# Patient Record
Sex: Female | Born: 1980 | Race: White | Hispanic: No | Smoking: Never smoker
Health system: Southern US, Community
[De-identification: ages and names within clinical notes are randomized; demographics above are authoritative.]

## PROBLEM LIST (undated history)

## (undated) DIAGNOSIS — M419 Scoliosis, unspecified: Secondary | ICD-10-CM

## (undated) HISTORY — PX: CHOLECYSTECTOMY: SHX55

## (undated) HISTORY — DX: Scoliosis, unspecified: M41.9

## (undated) HISTORY — PX: PLACEMENT OF BREAST IMPLANTS: SHX6334

---
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
## 2006-05-31 LAB — HM PAP SMEAR

## 2008-09-18 ENCOUNTER — Observation Stay: Payer: Self-pay

## 2008-10-28 ENCOUNTER — Inpatient Hospital Stay: Payer: Self-pay

## 2008-10-29 DIAGNOSIS — R609 Edema, unspecified: Secondary | ICD-10-CM

## 2010-10-11 ENCOUNTER — Emergency Department: Payer: Self-pay | Admitting: Emergency Medicine

## 2011-11-12 ENCOUNTER — Ambulatory Visit: Payer: Self-pay | Admitting: Neurology

## 2015-06-13 ENCOUNTER — Encounter: Payer: Self-pay | Admitting: Family Medicine

## 2015-06-13 ENCOUNTER — Ambulatory Visit (INDEPENDENT_AMBULATORY_CARE_PROVIDER_SITE_OTHER): Payer: 59 | Admitting: Family Medicine

## 2015-06-13 VITALS — BP 101/61 | HR 73 | Temp 98.9°F | Ht 64.2 in | Wt 151.0 lb

## 2015-06-13 DIAGNOSIS — M25531 Pain in right wrist: Secondary | ICD-10-CM | POA: Diagnosis not present

## 2015-06-13 MED ORDER — TRAMADOL HCL 50 MG PO TABS: 50.0000 mg | ORAL_TABLET | Freq: Three times a day (TID) | ORAL | Status: DC | PRN

## 2015-06-13 MED ORDER — PREDNISONE 10 MG PO TABS: ORAL_TABLET | ORAL | Status: DC

## 2015-06-13 NOTE — Progress Notes (Signed)
BP 101/61 mmHg  Pulse 73  Temp(Src) 98.9 F (37.2 C)  Ht 5' 4.2" (1.631 m)  Wt 151 lb (68.493 kg)  BMI 25.75 kg/m2  SpO2 99%  LMP 05/30/2015 (Approximate)   Subjective:    Patient ID: Theresa Roberts, female    DOB: 20-Jun-1981, 34 y.o.   MRN: [id number]  HPI: Theresa Roberts is a 34 y.o. female  Chief Complaint  Patient presents with  . Wrist Pain    patient sits at a desk all day, she states that the pain in her wrist is getting worse. She has some weakness in it. Also she will have pain in her elbow and shoulder.   WRIST PAIN- sits at a desk all day and this is the mouse hand, sits uncomfortably at her desk and notes that she is not sitting comfortably  Duration: chronic but acting up a lot more over the past couple of days Involved wrist: right Mechanism of injury:  no trauma Location: volar medial Onset: sudden Severity: severe  Quality:  sharp and aching Frequency: constant Radiation: yes Aggravating factors: typing and gripping  Alleviating factors: ice, NSAIDs, brace and rest  Status: worse Treatments attempted: rest, ice, APAP, ibuprofen and aleve    Relief with NSAIDs?:  mild Weakness: yes Numbness: yes Median nerve distribution Redness: yes Bruising: no Swelling: yes Fevers: no  Relevant past medical, surgical, family and social history reviewed and updated as indicated. Interim medical history since our last visit reviewed. Allergies and medications reviewed and updated.  Review of Systems  Constitutional: Negative.   Respiratory: Negative.   Cardiovascular: Negative.   Musculoskeletal: Positive for myalgias, joint swelling and arthralgias. Negative for back pain, gait problem, neck pain and neck stiffness.  Psychiatric/Behavioral: Negative.     Per HPI unless specifically indicated above     Objective:    BP 101/61 mmHg  Pulse 73  Temp(Src) 98.9 F (37.2 C)  Ht 5' 4.2" (1.631 m)  Wt 151 lb (68.493 kg)  BMI 25.75 kg/m2  SpO2 99%  LMP  05/30/2015 (Approximate)  Wt Readings from Last 3 Encounters:  06/13/15 151 lb (68.493 kg)  06/12/15 152 lb (68.947 kg)    Physical Exam  Constitutional: She is oriented to person, place, and time. She appears well-developed and well-nourished. No distress.  HENT:  Head: Normocephalic and atraumatic.  Right Ear: Hearing normal.  Left Ear: Hearing normal.  Nose: Nose normal.  Eyes: Conjunctivae and lids are normal. Right eye exhibits no discharge. Left eye exhibits no discharge. No scleral icterus.  Pulmonary/Chest: Effort normal. No respiratory distress.  Musculoskeletal: She exhibits edema and tenderness.  R wrist- + Tinel's over carpal tunnel, negative over cubital, negative phalen's, decreased ROM to flexion, normal otherwise, strength 5/5 thoughout with some pain to extension  Neurological: She is alert and oriented to person, place, and time. She displays normal reflexes. She exhibits normal muscle tone. Coordination normal.  Sensation in tact to light touch throughout  Skin: Skin is warm, dry and intact. No rash noted. No erythema. No pallor.  Psychiatric: She has a normal mood and affect. Her speech is normal and behavior is normal. Judgment and thought content normal. Cognition and memory are normal.  Nursing note and vitals reviewed.   Results for orders placed or performed in visit on 06/12/15  HM PAP SMEAR  Result Value Ref Range   HM Pap smear PP       Assessment & Plan:   Problem List Items Addressed This Visit  Other   Wrist pain, right - Primary    Likely CTS. Prednisone and tramadol right now. Brace and exercises. To have ergonomic eval of her work station. Call if not getting better or getting worse.           Follow up plan: Return if symptoms worsen or fail to improve.

## 2015-06-13 NOTE — Assessment & Plan Note (Signed)
Likely CTS. Prednisone and tramadol right now. Brace and exercises. To have ergonomic eval of her work station. Call if not getting better or getting worse.

## 2015-06-19 ENCOUNTER — Encounter: Payer: Self-pay | Admitting: Family Medicine

## 2015-06-19 ENCOUNTER — Ambulatory Visit (INDEPENDENT_AMBULATORY_CARE_PROVIDER_SITE_OTHER): Payer: 59 | Admitting: Family Medicine

## 2015-06-19 VITALS — BP 104/71 | HR 73 | Temp 99.3°F | Ht 63.3 in | Wt 152.0 lb

## 2015-06-19 DIAGNOSIS — M549 Dorsalgia, unspecified: Secondary | ICD-10-CM | POA: Insufficient documentation

## 2015-06-19 DIAGNOSIS — M2550 Pain in unspecified joint: Secondary | ICD-10-CM

## 2015-06-19 DIAGNOSIS — M546 Pain in thoracic spine: Secondary | ICD-10-CM

## 2015-06-19 MED ORDER — IBUPROFEN 600 MG PO TABS: 600.0000 mg | ORAL_TABLET | Freq: Three times a day (TID) | ORAL | Status: DC | PRN

## 2015-06-19 MED ORDER — CYCLOBENZAPRINE HCL 10 MG PO TABS: 10.0000 mg | ORAL_TABLET | Freq: Every day | ORAL | Status: DC

## 2015-06-19 NOTE — Patient Instructions (Signed)

## 2015-06-19 NOTE — Progress Notes (Signed)
BP 104/71 mmHg  Pulse 73  Temp(Src) 99.3 F (37.4 C)  Ht 5' 3.3" (1.608 m)  Wt 152 lb (68.947 kg)  BMI 26.67 kg/m2  SpO2 98%  LMP 05/30/2015 (Approximate)   Subjective:    Patient ID: Theresa Roberts, female    DOB: 1980-11-16, 34 y.o.   MRN: [id number]  HPI: Theresa Roberts is a 34 y.o. female  Chief Complaint  Patient presents with  . Arm Pain   ARTHRALGIAS / JOINT ACHES- back is really hurting, feels like when she had gall stones. Thinks that it might be her chair at work. Very sore to touch anywhere on her back. Wonders if it's her scoliosis, Wrist is still hurting, still waiting for the ergonomic exam at work. Elbow was also really achey. Not sure if it's her stress level Duration: Off and on for a couple of months, but worse now Pain: yes Severity: Almost feels numb, feels hot off and on Symmetric: yes Quality: burning and numb, nauseated Frequency: constant for the past couple of days Context: worse Decreased function/range of motion: yes  Erythema: no Swelling: no Heat or warmth: no   Morning stiffness: yes Aggravating factors: cold, sitting Alleviating factors: stretching and heat, NSAIDs in the past but not now Relief with NSAIDs?: not anymore Treatments attempted: NSAIDs, heat, stretching Involved Joints:     Hands: right    Wrists: right    Elbows: right    Shoulders: bilateral     Back: yes       Relevant past medical, surgical, family and social history reviewed and updated as indicated. Interim medical history since our last visit reviewed. Allergies and medications reviewed and updated.  Review of Systems  Constitutional: Negative.   Respiratory: Negative.   Cardiovascular: Negative.   Musculoskeletal: Positive for joint swelling and arthralgias. Negative for myalgias, back pain, gait problem, neck pain and neck stiffness.  Psychiatric/Behavioral: Negative.     Per HPI unless specifically indicated above     Objective:    BP 104/71 mmHg   Pulse 73  Temp(Src) 99.3 F (37.4 C)  Ht 5' 3.3" (1.608 m)  Wt 152 lb (68.947 kg)  BMI 26.67 kg/m2  SpO2 98%  LMP 05/30/2015 (Approximate)  Wt Readings from Last 3 Encounters:  06/19/15 152 lb (68.947 kg)  06/13/15 151 lb (68.493 kg)  06/12/15 152 lb (68.947 kg)    Physical Exam  Constitutional: She is oriented to person, place, and time. She appears well-developed and well-nourished. No distress.  HENT:  Head: Normocephalic and atraumatic.  Right Ear: Hearing normal.  Left Ear: Hearing normal.  Nose: Nose normal.  Eyes: Conjunctivae and lids are normal. Right eye exhibits no discharge. Left eye exhibits no discharge. No scleral icterus.  Neck: Normal range of motion. Neck supple. No JVD present. No tracheal deviation present. No thyromegaly present.  Cardiovascular: Normal rate, regular rhythm, normal heart sounds and intact distal pulses.  Exam reveals no gallop and no friction rub.   No murmur heard. Pulmonary/Chest: Effort normal and breath sounds normal. No stridor. No respiratory distress. She has no wheezes. She has no rales. She exhibits no tenderness.  Abdominal: Soft. Bowel sounds are normal. She exhibits no distension and no mass. There is no tenderness. There is no rebound and no guarding.  Musculoskeletal: Normal range of motion. She exhibits tenderness. She exhibits no edema.  Lymphadenopathy:    She has no cervical adenopathy.  Neurological: She is alert and oriented to person, place, and time.  Skin: Skin is warm, dry and intact. No rash noted. No erythema. No pallor.  Psychiatric: She has a normal mood and affect. Her speech is normal and behavior is normal. Judgment and thought content normal. Cognition and memory are normal.  Nursing note and vitals reviewed. Back Exam:    Inspection:  Normal spinal curvature.  No deformity, ecchymosis, erythema, or lesions     Palpation:     Midline spinal tenderness: no      Paralumbar tenderness: no      Parathoracic  tenderness: yes L>R     Buttocks tenderness: no     Range of Motion:      Flexion: Normal     Extension:Decreased     Lateral bending:Decreased    Rotation:Decreased    Neuro Exam:Lower extremity DTRs normal & symmetric.  Strength and sensation intact.       Results for orders placed or performed in visit on 06/12/15  HM PAP SMEAR  Result Value Ref Range   HM Pap smear PP       Assessment & Plan:   Problem List Items Addressed This Visit      Other   Notalgia    Possibly due to muscle spasm and stress on top of her scoliosis. Will treat with exercises and muscle relaxer and ibuprofen. Await ergonomic evaluation through work. Call if not getting better or getting worse.       Relevant Medications   cyclobenzaprine (FLEXERIL) 10 MG tablet   ibuprofen (ADVIL,MOTRIN) 600 MG tablet    Other Visit Diagnoses    Arthralgia    -  Primary    Possibly still overuse. We will check for tick-bourne illnesses, labs drawn today. Await results.     Relevant Orders    Lyme Ab/Western Blot Reflex    Rocky mtn spotted fvr abs pnl(IgG+IgM)    Babesia microti Antibody Panel    Ehrlichia Antibody Panel    Antinuclear Antib (ANA)        Follow up plan: Return 3-4 weeks, for follow up back pain.

## 2015-06-19 NOTE — Assessment & Plan Note (Signed)
Possibly due to muscle spasm and stress on top of her scoliosis. Will treat with exercises and muscle relaxer and ibuprofen. Await ergonomic evaluation through work. Call if not getting better or getting worse.

## 2015-06-20 LAB — ANA: ANA: NEGATIVE

## 2015-06-21 LAB — ROCKY MTN SPOTTED FVR ABS PNL(IGG+IGM)
RMSF IGG: NEGATIVE
RMSF IGM: 1.19 {index} — AB (ref 0.00–0.89)

## 2015-06-21 LAB — BABESIA MICROTI ANTIBODY PANEL: Babesia microti IgG: <1:10 {titer}

## 2015-06-22 LAB — EHRLICHIA ANTIBODY PANEL
E. Chaffeensis (HME) IgM Titer: NEGATIVE
E.Chaffeensis (HME) IgG: NEGATIVE
HGE IGG TITER: NEGATIVE
HGE IGM TITER: NEGATIVE

## 2015-06-23 ENCOUNTER — Telehealth: Payer: Self-pay | Admitting: Family Medicine

## 2015-06-23 LAB — LYME, WESTERN BLOT, SERUM (REFLEXED)
IGG P18 AB.: ABSENT
IGG P28 AB.: ABSENT
IGG P30 AB.: ABSENT
IGG P39 AB.: ABSENT
IGG P93 AB.: ABSENT
IGM P39 AB.: ABSENT
IGM P41 AB.: ABSENT
IgG P41 Ab.: ABSENT
IgG P45 Ab.: ABSENT
IgG P58 Ab.: ABSENT
IgG P66 Ab.: ABSENT
Lyme IgG Wb: NEGATIVE
Lyme IgM Wb: NEGATIVE

## 2015-06-23 LAB — LYME AB/WESTERN BLOT REFLEX
LYME DISEASE AB, QUANT, IGM: 0.99 index — ABNORMAL HIGH (ref 0.00–0.79)
Lyme IgG/IgM Ab: <0.91 {ISR} (ref 0.00–0.90)

## 2015-06-23 MED ORDER — DOXYCYCLINE HYCLATE 100 MG PO TABS: 100.0000 mg | ORAL_TABLET | Freq: Two times a day (BID) | ORAL | Status: DC

## 2015-06-23 NOTE — Telephone Encounter (Signed)
Abnormal labs I called patient Explained abnormal labs, will start doxy and have Dr. Wynetta Emery call her when she returns She has had fever (low-level) 99.8, some neck stiffness, wrist pain; soreness to the touch over shoulder blades; popping neck feels better Start doxycycline today, as soon as possible; [facility] on [address]/[place] I'll ask Dr. Wynetta Emery to call her tomorrow Reasons to go to ER discussed

## 2015-06-24 NOTE — Telephone Encounter (Signed)
Called patient. She is feeling terrible. Discussed spirochete reaction to doxy. She will keep an eye on it and make sure she starts to feel better. Home today to sleep. She will call if not doing better in the next couple of days.

## 2015-06-26 ENCOUNTER — Ambulatory Visit (INDEPENDENT_AMBULATORY_CARE_PROVIDER_SITE_OTHER): Payer: 59 | Admitting: Family Medicine

## 2015-06-26 ENCOUNTER — Encounter: Payer: Self-pay | Admitting: Family Medicine

## 2015-06-26 VITALS — BP 107/73 | HR 86 | Temp 99.2°F | Wt 150.7 lb

## 2015-06-26 DIAGNOSIS — A770 Spotted fever due to Rickettsia rickettsii: Secondary | ICD-10-CM | POA: Diagnosis not present

## 2015-06-26 DIAGNOSIS — A6920 Lyme disease, unspecified: Secondary | ICD-10-CM

## 2015-06-26 MED ORDER — TRAMADOL HCL 50 MG PO TABS: 50.0000 mg | ORAL_TABLET | Freq: Three times a day (TID) | ORAL | Status: DC | PRN

## 2015-06-26 MED ORDER — DOXYCYCLINE HYCLATE 100 MG PO TABS: 100.0000 mg | ORAL_TABLET | Freq: Two times a day (BID) | ORAL | Status: DC

## 2015-06-26 NOTE — Progress Notes (Signed)
BP 107/73 mmHg  Pulse 86  Temp(Src) 99.2 F (37.3 C)  Wt 150 lb 11.2 oz (68.357 kg)  SpO2 100%  LMP 05/30/2015 (Approximate)   Subjective:    Patient ID: Theresa Roberts, female    DOB: Nov 04, 1980, 34 y.o.   MRN: [id number]  HPI: Theresa Roberts is a 34 y.o. female  Chief Complaint  Patient presents with  . Insect Bite   Had a bite on her back and was concerned about it being a tick bite since her diagnosis with Lyme and testing positive for RMSF. Still feeling really sore and achey. Still running about a 99 degree temp. She is tolerating the doxy a little bit better, but is still not feeling like herself. Doxy prescribed for 10 days. No other concerns or complaints at this time.   Relevant past medical, surgical, family and social history reviewed and updated as indicated. Interim medical history since our last visit reviewed. Allergies and medications reviewed and updated.  Review of Systems  Constitutional: Negative.   HENT: Negative.   Respiratory: Negative.   Cardiovascular: Negative.   Musculoskeletal: Positive for myalgias, back pain, arthralgias, neck pain and neck stiffness. Negative for joint swelling and gait problem.  Psychiatric/Behavioral: Negative.     Per HPI unless specifically indicated above     Objective:    BP 107/73 mmHg  Pulse 86  Temp(Src) 99.2 F (37.3 C)  Wt 150 lb 11.2 oz (68.357 kg)  SpO2 100%  LMP 05/30/2015 (Approximate)  Wt Readings from Last 3 Encounters:  06/26/15 150 lb 11.2 oz (68.357 kg)  06/19/15 152 lb (68.947 kg)  06/13/15 151 lb (68.493 kg)    Physical Exam  Constitutional: She is oriented to person, place, and time. She appears well-developed and well-nourished. No distress.  HENT:  Head: Normocephalic and atraumatic.  Right Ear: Hearing normal.  Left Ear: Hearing normal.  Nose: Nose normal.  Eyes: Conjunctivae and lids are normal. Right eye exhibits no discharge. Left eye exhibits no discharge. No scleral icterus.   Cardiovascular: Normal rate, regular rhythm, normal heart sounds and intact distal pulses.  Exam reveals no gallop and no friction rub.   No murmur heard. Pulmonary/Chest: Effort normal and breath sounds normal. No respiratory distress. She has no wheezes. She has no rales. She exhibits no tenderness.  Musculoskeletal: Normal range of motion.  Neurological: She is alert and oriented to person, place, and time.  Skin: Skin is warm, dry and intact. No rash noted. No erythema. No pallor.  Dry skin with small area of excoriation on R lumbar, no sign of scab or tick or rash  Psychiatric: She has a normal mood and affect. Her speech is normal and behavior is normal. Judgment and thought content normal. Cognition and memory are normal.  Nursing note and vitals reviewed.   Results for orders placed or performed in visit on 06/19/15  Lyme Ab/Western Blot Reflex  Result Value Ref Range   Lyme IgG/IgM Ab <0.91 0.00 - 0.90 ISR   LYME DISEASE AB, QUANT, IGM 0.99 (H) 0.00 - 0.79 index  Rocky mtn spotted fvr abs pnl(IgG+IgM)  Result Value Ref Range   RMSF IgG Negative Negative   RMSF IgM 1.19 (H) 0.00 - 0.89 index  Babesia microti Antibody Panel  Result Value Ref Range   Babesia microti IgM <1:10 Neg:<1:10   Babesia microti IgG [id number]  Ehrlichia Antibody Panel  Result Value Ref Range   E.Chaffeensis (HME) IgG Negative Neg:<1:64   E. Chaffeensis (HME)  IgM Titer Negative Neg:<1:20   HGE IgG Titer Negative Neg:<1:64   HGE IgM Titer Negative Neg:<1:20  Antinuclear Antib (ANA)  Result Value Ref Range   Anit Nuclear Antibody(ANA) Negative Negative  Lyme, Western Blot, Serum  Result Value Ref Range     IgG P93 Ab. Absent      IgG P66 Ab. Absent      IgG P58 Ab. Absent      IgG P45 Ab. Absent      IgG P41 Ab. Absent      IgG P39 Ab. Absent      IgG P30 Ab. Absent      IgG P28 Ab. Absent      IgG P23 Ab. Present (A)      IgG P18 Ab. Absent    Lyme IgG Wb Negative      IgM P41 Ab.  Absent      IgM P39 Ab. Absent      IgM P23 Ab. Present (A)    Lyme IgM Wb Negative       Assessment & Plan:   Problem List Items Addressed This Visit    None    Visit Diagnoses    Lyme disease    -  Primary    Only 4 days into her doxy. Will continue it for 3 weeks, Refill of doxy to get through the whole 3 weeks given today.     Relevant Medications    doxycycline (VIBRA-TABS) 100 MG tablet    RMSF [facility] spotted fever)        Only 4 days into her doxy. Will continue it for 3 weeks, Refill of doxy to get through the whole 3 weeks given today.         Follow up plan: Return Monday or Tuesday after Thanksgiving, for Check on myalgias.

## 2015-07-07 ENCOUNTER — Telehealth: Payer: Self-pay

## 2015-07-07 NOTE — Telephone Encounter (Signed)
Patient called, she has an appt with [place] tomorrow. She would like to know if any of the [employer] paper work has been completed yet.

## 2015-07-07 NOTE — Telephone Encounter (Signed)
Notified patient.

## 2015-07-07 NOTE — Telephone Encounter (Signed)
All set and filled out to be faxed over and scanned. She can pick up a copy if she would like.

## 2015-07-08 ENCOUNTER — Ambulatory Visit (INDEPENDENT_AMBULATORY_CARE_PROVIDER_SITE_OTHER): Payer: 59 | Admitting: Family Medicine

## 2015-07-08 ENCOUNTER — Encounter: Payer: Self-pay | Admitting: Family Medicine

## 2015-07-08 VITALS — BP 104/69 | HR 77 | Temp 99.0°F | Ht 64.4 in | Wt 153.0 lb

## 2015-07-08 DIAGNOSIS — A6920 Lyme disease, unspecified: Secondary | ICD-10-CM

## 2015-07-08 MED ORDER — NAPROXEN 500 MG PO TABS: 500.0000 mg | ORAL_TABLET | Freq: Two times a day (BID) | ORAL | Status: DC

## 2015-07-08 MED ORDER — AMOXICILLIN 875 MG PO TABS: 875.0000 mg | ORAL_TABLET | Freq: Two times a day (BID) | ORAL | Status: DC

## 2015-07-08 NOTE — Patient Instructions (Signed)
Lactobacilis  Acidophilus

## 2015-07-08 NOTE — Progress Notes (Signed)
BP 104/69 mmHg  Pulse 77  Temp(Src) 99 F (37.2 C)  Ht 5' 4.4" (1.636 m)  Wt 153 lb (69.4 kg)  BMI 25.93 kg/m2  SpO2 98%  LMP 06/17/2015   Subjective:    Patient ID: Theresa Roberts, female    DOB: 1980-11-04, 34 y.o.   MRN: [id number]  HPI: Theresa Roberts is a 34 y.o. female  Chief Complaint  Patient presents with  . Tailbone Pain   Tail bone has been hurting and has been really acting up and causing her to be uncomfortable. All of her joints are still really hurting. Her back is still really hurting to touch. Feeling really tight and achy. Still running a 99 degree temp, which is not normal for her. Not feeling any different on her ibuprofen. Can't take the other pills during the day because they make her really sleepy. Tailbone has been hurting the most, but still really achy and just not like herself  Relevant past medical, surgical, family and social history reviewed and updated as indicated. Interim medical history since our last visit reviewed. Allergies and medications reviewed and updated.  Review of Systems  Constitutional: Negative.   Respiratory: Negative.   Cardiovascular: Negative.   Musculoskeletal: Negative.   Psychiatric/Behavioral: Negative.     Per HPI unless specifically indicated above     Objective:    BP 104/69 mmHg  Pulse 77  Temp(Src) 99 F (37.2 C)  Ht 5' 4.4" (1.636 m)  Wt 153 lb (69.4 kg)  BMI 25.93 kg/m2  SpO2 98%  LMP 06/17/2015  Wt Readings from Last 3 Encounters:  07/08/15 153 lb (69.4 kg)  06/26/15 150 lb 11.2 oz (68.357 kg)  06/19/15 152 lb (68.947 kg)    Physical Exam  Constitutional: She is oriented to person, place, and time. She appears well-developed and well-nourished. No distress.  HENT:  Head: Normocephalic and atraumatic.  Right Ear: Hearing normal.  Left Ear: Hearing normal.  Nose: Nose normal.  Eyes: Conjunctivae and lids are normal. Right eye exhibits no discharge. Left eye exhibits no discharge. No scleral  icterus.  Cardiovascular: Normal rate, regular rhythm, normal heart sounds and intact distal pulses.  Exam reveals no gallop and no friction rub.   No murmur heard. Pulmonary/Chest: Effort normal and breath sounds normal. No respiratory distress. She has no wheezes. She has no rales. She exhibits no tenderness.  Musculoskeletal: Normal range of motion. She exhibits tenderness. She exhibits no edema.  Neurological: She is alert and oriented to person, place, and time.  Skin: Skin is warm, dry and intact. No rash noted. No erythema. No pallor.  Psychiatric: She has a normal mood and affect. Her speech is normal and behavior is normal. Judgment and thought content normal. Cognition and memory are normal.  Nursing note and vitals reviewed.   Results for orders placed or performed in visit on 06/19/15  Lyme Ab/Western Blot Reflex  Result Value Ref Range   Lyme IgG/IgM Ab <0.91 0.00 - 0.90 ISR   LYME DISEASE AB, QUANT, IGM 0.99 (H) 0.00 - 0.79 index  Rocky mtn spotted fvr abs pnl(IgG+IgM)  Result Value Ref Range   RMSF IgG Negative Negative   RMSF IgM 1.19 (H) 0.00 - 0.89 index  Babesia microti Antibody Panel  Result Value Ref Range   Babesia microti IgM <1:10 Neg:<1:10   Babesia microti IgG [id number]  Ehrlichia Antibody Panel  Result Value Ref Range   E.Chaffeensis (HME) IgG Negative Neg:<1:64   E. Chaffeensis (HME)  IgM Titer Negative Neg:<1:20   HGE IgG Titer Negative Neg:<1:64   HGE IgM Titer Negative Neg:<1:20  Antinuclear Antib (ANA)  Result Value Ref Range   Anit Nuclear Antibody(ANA) Negative Negative  Lyme, Western Blot, Serum  Result Value Ref Range     IgG P93 Ab. Absent      IgG P66 Ab. Absent      IgG P58 Ab. Absent      IgG P45 Ab. Absent      IgG P41 Ab. Absent      IgG P39 Ab. Absent      IgG P30 Ab. Absent      IgG P28 Ab. Absent      IgG P23 Ab. Present (A)      IgG P18 Ab. Absent    Lyme IgG Wb Negative      IgM P41 Ab. Absent      IgM P39 Ab. Absent       IgM P23 Ab. Present (A)    Lyme IgM Wb Negative       Assessment & Plan:   Problem List Items Addressed This Visit    None    Visit Diagnoses    Lyme disease    -  Primary    Will add amoxicillin and continue doxy. Take probiotic. Will monitor closely. Naproxen for pain. Rest and plenty of fluids. Follow up 2 weeks.     Relevant Medications    amoxicillin (AMOXIL) 875 MG tablet        Follow up plan: Return As scheduled.

## 2015-07-17 ENCOUNTER — Ambulatory Visit: Payer: 59 | Admitting: Family Medicine

## 2015-07-18 ENCOUNTER — Ambulatory Visit (INDEPENDENT_AMBULATORY_CARE_PROVIDER_SITE_OTHER): Payer: 59 | Admitting: Family Medicine

## 2015-07-18 ENCOUNTER — Encounter: Payer: Self-pay | Admitting: Family Medicine

## 2015-07-18 VITALS — BP 104/66 | HR 74 | Temp 98.9°F | Ht 64.0 in | Wt 157.2 lb

## 2015-07-18 DIAGNOSIS — M2550 Pain in unspecified joint: Secondary | ICD-10-CM

## 2015-07-18 DIAGNOSIS — M9905 Segmental and somatic dysfunction of pelvic region: Secondary | ICD-10-CM | POA: Diagnosis not present

## 2015-07-18 DIAGNOSIS — M9903 Segmental and somatic dysfunction of lumbar region: Secondary | ICD-10-CM | POA: Diagnosis not present

## 2015-07-18 DIAGNOSIS — M533 Sacrococcygeal disorders, not elsewhere classified: Secondary | ICD-10-CM

## 2015-07-18 DIAGNOSIS — M9904 Segmental and somatic dysfunction of sacral region: Secondary | ICD-10-CM

## 2015-07-18 DIAGNOSIS — M9909 Segmental and somatic dysfunction of abdomen and other regions: Secondary | ICD-10-CM | POA: Diagnosis not present

## 2015-07-18 DIAGNOSIS — M9906 Segmental and somatic dysfunction of lower extremity: Secondary | ICD-10-CM

## 2015-07-18 NOTE — Progress Notes (Signed)
BP 104/66 mmHg  Pulse 74  Temp(Src) 98.9 F (37.2 C)  Ht 5' 4"  (1.626 m)  Wt 157 lb 3.2 oz (71.305 kg)  BMI 26.97 kg/m2  SpO2 100%  LMP 06/17/2015   Subjective:    Patient ID: Theresa Roberts, female    DOB: [birth year], 34 y.o.   MRN: [id number]  HPI: Theresa Roberts is a 34 y.o. female  Chief Complaint  Patient presents with  . Back Pain    OMM   Still tail bone really hurting and still having neck pain. Not sure if the amoxicillin really helped. Still feeling like she is having fevers, low grade, subjective, Notes that it's the first time that she has been below 99, feeling clammy and sweaty. No diarrhea. Ergonomics hasn't gotten in contact with her. Better now than she had been, still feeling really achey and not feeling like herself Has apthous ulcer at the back of her mouth. Her tail bone continues to hurt, especially when sitting. Work has been more difficult about letting her get up a move around. She notes that her pain is severe and aching and deep. Some radiation into her back. No numbness or tingling.She would be interested in doing OMT to see if it would help.  No other concerns or complaints at this time.   Relevant past medical, surgical, family and social history reviewed and updated as indicated. Interim medical history since our last visit reviewed. Allergies and medications reviewed and updated.  Review of Systems  Constitutional: Negative.   Respiratory: Negative.   Cardiovascular: Negative.   Musculoskeletal: Positive for myalgias, back pain, joint swelling, arthralgias and gait problem. Negative for neck pain and neck stiffness.  Skin: Negative.   Neurological: Negative.   Psychiatric/Behavioral: Negative.     Per HPI unless specifically indicated above     Objective:    BP 104/66 mmHg  Pulse 74  Temp(Src) 98.9 F (37.2 C)  Ht 5' 4"  (1.626 m)  Wt 157 lb 3.2 oz (71.305 kg)  BMI 26.97 kg/m2  SpO2 100%  LMP 06/17/2015  Wt Readings from Last 3  Encounters:  07/18/15 157 lb 3.2 oz (71.305 kg)  07/08/15 153 lb (69.4 kg)  06/26/15 150 lb 11.2 oz (68.357 kg)    Physical Exam  Constitutional: She is oriented to person, place, and time. She appears well-developed and well-nourished. No distress.  HENT:  Head: Normocephalic and atraumatic.  Right Ear: Hearing normal.  Left Ear: Hearing normal.  Nose: Nose normal.  Eyes: Conjunctivae and lids are normal. Right eye exhibits no discharge. Left eye exhibits no discharge. No scleral icterus.  Pulmonary/Chest: Effort normal. No respiratory distress.  Abdominal: Soft. She exhibits no distension and no mass. There is no tenderness. There is no rebound and no guarding.  Neurological: She is alert and oriented to person, place, and time.  Skin: Skin is warm, dry and intact. No rash noted. No erythema. No pallor.  Psychiatric: She has a normal mood and affect. Her speech is normal and behavior is normal. Judgment and thought content normal. Cognition and memory are normal.  Nursing note and vitals reviewed. Musculoskeletal:  Exam found Decreased ROM, Tissue texture changes, Tenderness to palpation and Asymmetry of patient's  lumbar, pelvis, sacrum, lower extremity and abdomen Osteopathic Structural Exam:   Lumbar: QL hypertonic on the L, psoas hypertonic bilaterally, L3-5SLRR  Pelvis: Anterior R innominate, pubic sheer on the L  Sacrum: R on L sacral torsion, SI joint restricted on the R, interosseous strain  through S1  Lower Extremity:glut spasm on the L, hamstring spasm on the L  Abdomen: diaphragm spasm on the L, pelvic diaphragm into the sacrum   Results for orders placed or performed in visit on 06/19/15  Lyme Ab/Western Blot Reflex  Result Value Ref Range   Lyme IgG/IgM Ab <0.91 0.00 - 0.90 ISR   LYME DISEASE AB, QUANT, IGM 0.99 (H) 0.00 - 0.79 index  Rocky mtn spotted fvr abs pnl(IgG+IgM)  Result Value Ref Range   RMSF IgG Negative Negative   RMSF IgM 1.19 (H) 0.00 - 0.89 index   Babesia microti Antibody Panel  Result Value Ref Range   Babesia microti IgM <1:10 Neg:<1:10   Babesia microti IgG <7:35 HGD:<9:24  Ehrlichia Antibody Panel  Result Value Ref Range   E.Chaffeensis (HME) IgG Negative Neg:<1:64   E. Chaffeensis (HME) IgM Titer Negative Neg:<1:20   HGE IgG Titer Negative Neg:<1:64   HGE IgM Titer Negative Neg:<1:20  Antinuclear Antib (ANA)  Result Value Ref Range   Anit Nuclear Antibody(ANA) Negative Negative  Lyme, Western Blot, Serum  Result Value Ref Range     IgG P93 Ab. Absent      IgG P66 Ab. Absent      IgG P58 Ab. Absent      IgG P45 Ab. Absent      IgG P41 Ab. Absent      IgG P39 Ab. Absent      IgG P30 Ab. Absent      IgG P28 Ab. Absent      IgG P23 Ab. Present (A)      IgG P18 Ab. Absent    Lyme IgG Wb Negative      IgM P41 Ab. Absent      IgM P39 Ab. Absent      IgM P23 Ab. Present (A)    Lyme IgM Wb Negative       Assessment & Plan:   Problem List Items Addressed This Visit      Other   Sacralgia - Primary    Seems to be myofascial in nature with a significant interosseous strain. She does have some somatic dysfunctions that I think are contributing to her symptoms. I think she would benefit from OMT. Patient treated today with fair results as discussed below.       Other Visit Diagnoses    Arthralgia        Will check for autoimmune issues. Await lab results.     Relevant Orders    Antinuclear Antib (ANA)    Cyclic citrul peptide antibody, IgG    Sed Rate (ESR)    Anti-DNA antibody, double-stranded    Sacral region somatic dysfunction        Somatic dysfunction of pelvic region        Somatic dysfunction of abdominal region        Somatic dysfunction of lower extremity        Lumbar region somatic dysfunction          After verbal consent was obtained, patient was treated today with osteopathic manipulative medicine to the regions of the lumbar, pelvis, sacrum, abdomen and lower extremity using the techniques of  FPR, myofascial release, counterstrain, muscle energy, HVLA and soft tissue. Areas of compensation relating to her primary pain source also treated. Patient tolerated the procedure well with fair objective and fair subjective improvement in symptoms. She left the room in good condition. She was advised to stay well hydrated and that she may have  some soreness following the procedure. If not improving or worsening, she will call and come in. She will return for reevaluation   in 2-3 weeks.   Follow up plan: Return 2-3 weeks.

## 2015-07-18 NOTE — Assessment & Plan Note (Signed)
Seems to be myofascial in nature with a significant interosseous strain. She does have some somatic dysfunctions that I think are contributing to her symptoms. I think she would benefit from OMT. Patient treated today with fair results as discussed below.

## 2015-07-19 LAB — SEDIMENTATION RATE: Sed Rate: 2 mm/hr (ref 0–32)

## 2015-07-19 LAB — ANA: Anti Nuclear Antibody(ANA): NEGATIVE

## 2015-07-19 LAB — ANTI-DNA ANTIBODY, DOUBLE-STRANDED: dsDNA Ab: <1 IU/mL (ref 0–9)

## 2015-07-21 ENCOUNTER — Telehealth: Payer: Self-pay | Admitting: Family Medicine

## 2015-07-21 NOTE — Telephone Encounter (Signed)
Forward to provider

## 2015-07-21 NOTE — Telephone Encounter (Signed)
Pt needs note for work to be able to get up frequently and use bathroom etc.  Please call her to advise.

## 2015-07-21 NOTE — Telephone Encounter (Addendum)
Letter written for her. She can pick it up or we can fax it. Also please let her know the labs we've gotten back so far are normal, but we're still waiting on 1

## 2015-07-21 NOTE — Telephone Encounter (Signed)
Will fax letter.

## 2015-07-23 ENCOUNTER — Telehealth: Payer: Self-pay

## 2015-07-23 MED ORDER — FLUCONAZOLE 150 MG PO TABS: 150.0000 mg | ORAL_TABLET | Freq: Once | ORAL | Status: DC

## 2015-07-23 NOTE — Telephone Encounter (Signed)
Rx sent to her pharmacy 

## 2015-07-23 NOTE — Telephone Encounter (Signed)
[facility]  Patient has a yeast infection due to the antibiotics, can you send over a pill for this

## 2015-07-26 LAB — CYCLIC CITRUL PEPTIDE ANTIBODY, IGG/IGA: CYCLIC CITRULLIN PEPTIDE AB: 5 U (ref 0–19)

## 2015-07-26 LAB — SPECIMEN STATUS REPORT

## 2015-07-29 ENCOUNTER — Telehealth: Payer: Self-pay | Admitting: Family Medicine

## 2015-07-29 NOTE — Telephone Encounter (Signed)
I talked with patient about the lab results She will keep f/u with Dr. Wynetta Emery next week, appt date and time discussed

## 2015-08-08 ENCOUNTER — Ambulatory Visit (INDEPENDENT_AMBULATORY_CARE_PROVIDER_SITE_OTHER): Payer: 59 | Admitting: Family Medicine

## 2015-08-08 ENCOUNTER — Encounter: Payer: Self-pay | Admitting: Family Medicine

## 2015-08-08 VITALS — BP 100/66 | HR 76 | Temp 99.2°F | Ht 64.0 in | Wt 152.0 lb

## 2015-08-08 DIAGNOSIS — M533 Sacrococcygeal disorders, not elsewhere classified: Secondary | ICD-10-CM

## 2015-08-08 MED ORDER — AMITRIPTYLINE HCL 25 MG PO TABS: 25.0000 mg | ORAL_TABLET | Freq: Every day | ORAL | Status: DC

## 2015-08-08 NOTE — Progress Notes (Signed)
BP 100/66 mmHg  Pulse 76  Temp(Src) 99.2 F (37.3 C)  Ht 5' 4"  (1.626 m)  Wt 152 lb (68.947 kg)  BMI 26.08 kg/m2  SpO2 99%  LMP  (LMP Unknown)   Subjective:    Patient ID: Theresa Roberts, female    DOB: October 28, 1980, 34 y.o.   MRN: [id number]  HPI: Theresa Roberts is a 34 y.o. female  Chief Complaint  Patient presents with  . Tailbone Pain    OMM   Has been in a lot of pain. Did OK after her last appointment but really didn't notice much of a difference. Continues with a lot of pain directly in her tail bone. Denies any trauma to the area. Not feeling any better following her treatment with antibiotics for lyme. Has still not had any ergonomic evaluation. Her job continues to have her sit for long periods of time and makes her feel worse. Pain is severe, low in her tailbone. No radiation. Sharp and deeply aching. Nothing makes it better, nothing makes it worse. She is unclear as to what she should do and is feeling very frustrated. No other concerns or complaints at this time.    Relevant past medical, surgical, family and social history reviewed and updated as indicated. Interim medical history since our last visit reviewed. Allergies and medications reviewed and updated.  Review of Systems  Constitutional: Negative.   Respiratory: Negative.   Cardiovascular: Negative.   Musculoskeletal: Positive for myalgias and back pain. Negative for joint swelling, arthralgias, gait problem, neck pain and neck stiffness.  Neurological: Negative.   Psychiatric/Behavioral: Negative.     Per HPI unless specifically indicated above     Objective:    BP 100/66 mmHg  Pulse 76  Temp(Src) 99.2 F (37.3 C)  Ht 5' 4"  (1.626 m)  Wt 152 lb (68.947 kg)  BMI 26.08 kg/m2  SpO2 99%  LMP  (LMP Unknown)  Wt Readings from Last 3 Encounters:  08/08/15 152 lb (68.947 kg)  07/18/15 157 lb 3.2 oz (71.305 kg)  07/08/15 153 lb (69.4 kg)    Physical Exam  Constitutional: She is oriented to person,  place, and time. She appears well-developed and well-nourished. No distress.  HENT:  Head: Normocephalic and atraumatic.  Right Ear: Hearing normal.  Left Ear: Hearing normal.  Nose: Nose normal.  Eyes: Conjunctivae and lids are normal. Right eye exhibits no discharge. Left eye exhibits no discharge. No scleral icterus.  Cardiovascular: Normal rate, regular rhythm, normal heart sounds and intact distal pulses.  Exam reveals no gallop and no friction rub.   No murmur heard. Pulmonary/Chest: Effort normal and breath sounds normal. No respiratory distress. She has no wheezes. She has no rales. She exhibits no tenderness.  Musculoskeletal: Normal range of motion. She exhibits tenderness. She exhibits no edema.  Neurological: She is alert and oriented to person, place, and time. She has normal reflexes. She displays normal reflexes. No cranial nerve deficit. She exhibits normal muscle tone. Coordination normal.  Skin: Skin is warm, dry and intact. No rash noted. No erythema. No pallor.  Psychiatric: She has a normal mood and affect. Her speech is normal and behavior is normal. Judgment and thought content normal. Cognition and memory are normal.  Nursing note and vitals reviewed.   Results for orders placed or performed in visit on 07/18/15  Antinuclear Antib (ANA)  Result Value Ref Range   Anit Nuclear Antibody(ANA) Negative Negative  Sed Rate (ESR)  Result Value Ref Range  Sed Rate 2 0 - 32 mm/hr  Anti-DNA antibody, double-stranded  Result Value Ref Range   dsDNA Ab <1 0 - 9 IU/mL  Specimen status report  Result Value Ref Range   specimen status report Comment   CYCLIC CITRUL PEPTIDE ANTIBODY, IGG/IGA  Result Value Ref Range   Cyclic Citrullin Peptide Ab 5 0 - 19 units      Assessment & Plan:   Problem List Items Addressed This Visit      Other   Sacralgia - Primary    Of unclear etiology. Has been going on for over 6 weeks, so we will obtain x-ray of her lumbar and sacrum.  Will refer to PT for evaluation and treatment. We will also try amitriptyline to see if pain is more nerve related. Continue to monitor closely, will recheck in 2-3 weeks.       Relevant Orders   DG Lumbar Spine Complete   DG Sacrum/Coccyx       Follow up plan: Return 2-3 weeks, for follow up on tail bone pain.

## 2015-08-08 NOTE — Patient Instructions (Signed)
  [facility] [place]   Address: [address] [place], [place], [place] [address]  [phone]  Hours:  Open today  8AM-5PM  [facility]  Physical Therapy Clinic  [place]  [phone]  Open until 5:00 PM  [facility]  No reviews  Physical Therapist  [place] [id number]  [phone]  [place]  No reviews  Physical Therapist  [place]  [phone]

## 2015-08-10 NOTE — Assessment & Plan Note (Signed)
Of unclear etiology. Has been going on for over 6 weeks, so we will obtain x-ray of her lumbar and sacrum. Will refer to PT for evaluation and treatment. We will also try amitriptyline to see if pain is more nerve related. Continue to monitor closely, will recheck in 2-3 weeks.

## 2015-08-13 ENCOUNTER — Telehealth: Payer: Self-pay | Admitting: Family Medicine

## 2015-08-13 NOTE — Telephone Encounter (Signed)
Patient called wanting to talk to you or Dr. Wynetta Emery about her Test results.

## 2015-08-14 ENCOUNTER — Ambulatory Visit
Admission: RE | Admit: 2015-08-14 | Discharge: 2015-08-14 | Disposition: A | Discharge status: Home / Self Care | Payer: 59 | Source: Ambulatory Visit | Attending: Family Medicine | Admitting: Family Medicine

## 2015-08-14 DIAGNOSIS — M533 Sacrococcygeal disorders, not elsewhere classified: Secondary | ICD-10-CM

## 2015-08-14 NOTE — Telephone Encounter (Signed)
Patient had questions about getting some test done, patient was placed on the schedule for an appointment

## 2015-08-15 ENCOUNTER — Ambulatory Visit (INDEPENDENT_AMBULATORY_CARE_PROVIDER_SITE_OTHER): Payer: 59 | Admitting: Family Medicine

## 2015-08-15 ENCOUNTER — Encounter: Payer: Self-pay | Admitting: Family Medicine

## 2015-08-15 VITALS — BP 119/83 | HR 76 | Temp 98.7°F | Ht 64.0 in | Wt 153.0 lb

## 2015-08-15 DIAGNOSIS — Z113 Encounter for screening for infections with a predominantly sexual mode of transmission: Secondary | ICD-10-CM

## 2015-08-15 LAB — PREGNANCY, URINE: Preg Test, Ur: NEGATIVE

## 2015-08-15 LAB — WET PREP FOR TRICH, YEAST, CLUE
Clue Cell Exam: NEGATIVE
TRICHOMONAS EXAM: NEGATIVE
YEAST EXAM: NEGATIVE

## 2015-08-15 NOTE — Progress Notes (Signed)
BP 119/83 mmHg  Pulse 76  Temp(Src) 98.7 F (37.1 C)  Ht _0  (1.626 m)  Wt 153 lb (69.4 kg)  BMI 26.25 kg/m2  SpO2 99%  LMP  (LMP Unknown)   Subjective:    Patient ID: Bobbie Stack, female    DOB: 11-05-1980, 35 y.o.   MRN: [id number]  HPI: MATTESON BLUE is a 35 y.o. female  Chief Complaint  Patient presents with  . SEXUALLY TRANSMITTED DISEASE    patient is here to be checked for all STD's   STD SCREENING Sexual activity:  Practices careful partner selection. Concerned about her female partner possibly giving her something. Also concerned that her ex-husband could have given her something Contraception: no Recent unprotected intercourse: yes History of sexually transmitted diseases: yes- trichamonis Previous sexually transmitted disease screening: yes Lifetime sexual partners:  Genital lesions: no Vaginal discharge: no Dysuria: no Swollen lymph nodes: no Fevers: no Rash: no  Relevant past medical, surgical, family and social history reviewed and updated as indicated. Interim medical history since our last visit reviewed. Allergies and medications reviewed and updated.  Review of Systems  Constitutional: Negative.   Respiratory: Negative.   Cardiovascular: Negative.   Genitourinary: Negative.   Psychiatric/Behavioral: Negative.     Per HPI unless specifically indicated above     Objective:    BP 119/83 mmHg  Pulse 76  Temp(Src) 98.7 F (37.1 C)  Ht _1  (1.626 m)  Wt 153 lb (69.4 kg)  BMI 26.25 kg/m2  SpO2 99%  LMP  (LMP Unknown)  Wt Readings from Last 3 Encounters:  08/15/15 153 lb (69.4 kg)  08/08/15 152 lb (68.947 kg)  07/18/15 157 lb 3.2 oz (71.305 kg)    Physical Exam  Constitutional: She is oriented to person, place, and time. She appears well-developed and well-nourished. No distress.  HENT:  Head: Normocephalic and atraumatic.  Right Ear: Hearing normal.  Left Ear: Hearing normal.  Nose: Nose normal.  Eyes: Conjunctivae and  lids are normal. Right eye exhibits no discharge. Left eye exhibits no discharge. No scleral icterus.  Pulmonary/Chest: Effort normal. No respiratory distress.  Genitourinary: Vagina normal. No vaginal discharge found.  Musculoskeletal: Normal range of motion.  Neurological: She is alert and oriented to person, place, and time.  Skin: Skin is warm, dry and intact. No rash noted. No erythema. No pallor.  Psychiatric: Her speech is normal and behavior is normal. Judgment and thought content normal. Her mood appears anxious. Cognition and memory are normal.  Nursing note and vitals reviewed.   Results for orders placed or performed in visit on 07/18/15  Antinuclear Antib (ANA)  Result Value Ref Range   Anit Nuclear Antibody(ANA) Negative Negative  Sed Rate (ESR)  Result Value Ref Range   Sed Rate 2 0 - 32 mm/hr  Anti-DNA antibody, double-stranded  Result Value Ref Range   dsDNA Ab <1 0 - 9 IU/mL  Specimen status report  Result Value Ref Range   specimen status report Comment   CYCLIC CITRUL PEPTIDE ANTIBODY, IGG/IGA  Result Value Ref Range   Cyclic Citrullin Peptide Ab 5 0 - 19 units      Assessment & Plan:   Problem List Items Addressed This Visit    None    Visit Diagnoses    Routine screening for STI (sexually transmitted infection)    -  Primary    No symptoms, but very concerned. Will check and await results. Wet prep done today was negative. Continue to  monitor.     Relevant Orders    HIV antibody    Hepatitis, Acute    HSV(herpes simplex vrs) 1+2 ab-IgG    GC/Chlamydia Probe Amp    RPR    WET PREP FOR TRICH, YEAST, CLUE    Pregnancy, urine        Follow up plan: Return if symptoms worsen or fail to improve.

## 2015-08-16 LAB — RPR: RPR: NONREACTIVE

## 2015-08-16 LAB — HEPATITIS PANEL, ACUTE
HEP A IGM: NEGATIVE
Hep B C IgM: NEGATIVE
Hepatitis B Surface Ag: NEGATIVE

## 2015-08-16 LAB — HSV(HERPES SIMPLEX VRS) I + II AB-IGG
HSV 1 GLYCOPROTEIN G AB, IGG: 40.7 {index} — AB (ref 0.00–0.90)
HSV 2 GLYCOPROTEIN G AB, IGG: 6.24 {index} — AB (ref 0.00–0.90)

## 2015-08-16 LAB — HIV ANTIBODY (ROUTINE TESTING W REFLEX): HIV Screen 4th Generation wRfx: NONREACTIVE

## 2015-08-19 ENCOUNTER — Telehealth: Payer: Self-pay | Admitting: Family Medicine

## 2015-08-19 LAB — GC/CHLAMYDIA PROBE AMP
CHLAMYDIA, DNA PROBE: NEGATIVE
NEISSERIA GONORRHOEAE BY PCR: NEGATIVE

## 2015-08-19 NOTE — Telephone Encounter (Signed)
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 If she wants me to call her let me know and I'll call her tomorrow. Thanks!

## 2015-08-20 NOTE — Telephone Encounter (Signed)
Patient notified

## 2015-08-29 ENCOUNTER — Ambulatory Visit
Admission: RE | Admit: 2015-08-29 | Discharge: 2015-08-29 | Disposition: A | Discharge status: Home / Self Care | Payer: 59 | Source: Ambulatory Visit | Attending: Family Medicine | Admitting: Family Medicine

## 2015-08-29 DIAGNOSIS — M419 Scoliosis, unspecified: Secondary | ICD-10-CM | POA: Diagnosis not present

## 2015-08-29 DIAGNOSIS — M533 Sacrococcygeal disorders, not elsewhere classified: Secondary | ICD-10-CM | POA: Diagnosis present

## 2015-09-02 ENCOUNTER — Telehealth: Payer: Self-pay | Admitting: Family Medicine

## 2015-09-02 DIAGNOSIS — M4310 Spondylolisthesis, site unspecified: Secondary | ICD-10-CM | POA: Insufficient documentation

## 2015-09-02 MED ORDER — IBUPROFEN 600 MG PO TABS: 600.0000 mg | ORAL_TABLET | Freq: Three times a day (TID) | ORAL | Status: DC | PRN

## 2015-09-02 NOTE — Telephone Encounter (Signed)
Called and spoke to [facility] about her results. Mild anterolisthesis. Has not done PT yet, having some financial difficulties. Referral to sports med made. Refill on ibuprofen given. Continue to monitor.

## 2015-09-11 ENCOUNTER — Encounter: Payer: Self-pay | Admitting: Family Medicine

## 2015-09-11 ENCOUNTER — Ambulatory Visit (INDEPENDENT_AMBULATORY_CARE_PROVIDER_SITE_OTHER): Payer: 59 | Admitting: Family Medicine

## 2015-09-11 VITALS — BP 108/74 | HR 83 | Ht 64.0 in | Wt 150.0 lb

## 2015-09-11 DIAGNOSIS — M5441 Lumbago with sciatica, right side: Secondary | ICD-10-CM | POA: Diagnosis not present

## 2015-09-11 MED ORDER — PREDNISONE 10 MG PO TABS: ORAL_TABLET | ORAL | Status: DC

## 2015-09-11 MED ORDER — HYDROCODONE-ACETAMINOPHEN 5-325 MG PO TABS: 1.0000 | ORAL_TABLET | Freq: Four times a day (QID) | ORAL | Status: DC | PRN

## 2015-09-11 NOTE — Patient Instructions (Signed)
You have coccydynia and low back pain - concern for irritated nerve on the right side as well Take tylenol for baseline pain relief (1-2 extra strength tabs 3x/day) A prednisone dose pack is the best option for immediate relief and may be prescribed. Day after finishing prednisone start aleve 2 tabs twice a day with food for pain and inflammation. Norco as needed for severe pain (no driving on this medicine). Stay as active as possible. Physical therapy has been shown to be helpful as well - call us if you want to do this and it's not too expensive. Strengthening of low back muscles, abdominal musculature are key for long term pain relief. If not improving, will consider further imaging (MRI). Call me in 1-2 weeks to let me know how you're doing.

## 2015-09-15 DIAGNOSIS — M545 Low back pain: Secondary | ICD-10-CM | POA: Insufficient documentation

## 2015-09-15 DIAGNOSIS — G8929 Other chronic pain: Secondary | ICD-10-CM | POA: Insufficient documentation

## 2015-09-15 NOTE — Assessment & Plan Note (Signed)
with coccydynia.  Independently reviewed radiographs - difficult to see pars defects at L5 but do not believe these are the cause of her pain given her history.  Some right sided radiculopathic symptoms with this.  Start with prednisone dose pack transition to aleve.  Norco as needed.  Shown home exercise program to start.  Consider physical therapy, MRI depending on her improvement over the next 1-2 weeks.

## 2015-09-15 NOTE — Progress Notes (Signed)
PCP and consultation requested by: Park Liter, DO  Subjective:   HPI: Patient is a 35 y.o. female here for low back and coccyx pain.  Patient reports for about 3 months she's had worsening low back and tailbone pain. Pain level now 10/10, radiates up the back and sharp. No known injury or trauma. Works at [employer] job. Sometimes radiates into right leg. Not helped by any medication. Worse with prolonged immobilization. No bowel/bladder dysfunction.  Past Medical History  Diagnosis Date  . Scoliosis     Current Outpatient Prescriptions on File Prior to Visit  Medication Sig Dispense Refill  . amitriptyline (ELAVIL) 25 MG tablet Take 1 tablet (25 mg total) by mouth at bedtime. 30 tablet 1   No current facility-administered medications on file prior to visit.    Past Surgical History  Procedure Laterality Date  . Placement of breast implants      No Known Allergies  Social History   Social History  . Marital Status: Single    Spouse Name: N/A  . Number of Children: N/A  . Years of Education: N/A   Occupational History  . Not on file.   Social History Main Topics  . Smoking status: Never Smoker   . Smokeless tobacco: Never Used  . Alcohol Use: No  . Drug Use: No  . Sexual Activity: Yes    Birth Control/ Protection: Implant   Other Topics Concern  . Not on file   Social History Narrative    Family History  Problem Relation Age of Onset  . Asthma Son   . Heart attack Maternal Grandfather     BP 108/74 mmHg  Pulse 83  Ht 5\' 4"  (1.626 m)  Wt 150 lb (68.04 kg)  BMI 25.73 kg/m2  Review of Systems: See HPI above.    Objective:  Physical Exam:  Gen: NAD, uncomfortable.  Back: No gross deformity, scoliosis. TTP midline low lumbar, sacrum, coccyx.  Mild right paraspinal lumbar tenderness. FROM with pain on extension > flexion. Strength LEs 5/5 all muscle groups.   2+ MSRs in patellar and achilles tendons, equal bilaterally. Negative  SLRs. Sensation intact to light touch bilaterally. Negative logroll bilateral hips Negative fabers and piriformis stretches.    Assessment & Plan:  1. Low back pain - with coccydynia.  Independently reviewed radiographs - difficult to see pars defects at L5 but do not believe these are the cause of her pain given her history.  Some right sided radiculopathic symptoms with this.  Start with prednisone dose pack transition to aleve.  Norco as needed.  Shown home exercise program to start.  Consider physical therapy, MRI depending on her improvement over the next 1-2 weeks.

## 2015-12-02 ENCOUNTER — Telehealth: Payer: Self-pay | Admitting: Family Medicine

## 2015-12-02 NOTE — Telephone Encounter (Signed)
Spoke to patient and will set her up with [facility] in [place].

## 2015-12-02 NOTE — Telephone Encounter (Signed)
I would go ahead with physical therapy and see her back about 1 month after she's started that to reevaluate.  Thanks!

## 2016-01-14 ENCOUNTER — Telehealth: Payer: Self-pay | Admitting: Family Medicine

## 2016-01-14 NOTE — Telephone Encounter (Signed)
Patient called wanting to talk with Dr. Wynetta Emery about some concern she has with her health. Best number to call back is [phone].

## 2016-01-14 NOTE — Telephone Encounter (Signed)
Christin: Can you please call and get patient on the schedule.

## 2016-01-23 ENCOUNTER — Ambulatory Visit (INDEPENDENT_AMBULATORY_CARE_PROVIDER_SITE_OTHER): Payer: 59 | Admitting: Family Medicine

## 2016-01-23 ENCOUNTER — Encounter: Payer: Self-pay | Admitting: Family Medicine

## 2016-01-23 VITALS — BP 98/66 | HR 75 | Temp 98.6°F | Ht 64.0 in | Wt 156.0 lb

## 2016-01-23 DIAGNOSIS — M419 Scoliosis, unspecified: Secondary | ICD-10-CM

## 2016-01-23 MED ORDER — HYDROCODONE-ACETAMINOPHEN 5-325 MG PO TABS: 1.0000 | ORAL_TABLET | Freq: Four times a day (QID) | ORAL | Status: DC | PRN

## 2016-01-23 NOTE — Assessment & Plan Note (Addendum)
Pain is worse. Will continue with current regimen. Small amount of hydrocodone PRN. Referral to spine specialist. Follow up 6-8 weeks.

## 2016-01-23 NOTE — Progress Notes (Signed)
BP 98/66 mmHg  Pulse 75  Temp(Src) 98.6 F (37 C)  Ht 5\' 4"  (1.626 m)  Wt 156 lb (70.761 kg)  BMI 26.76 kg/m2  SpO2 99%  LMP  (LMP Unknown)   Subjective:    Patient ID: Theresa Roberts, female    DOB: 12/22/1980, 35 y.o.   MRN: [id number]  HPI: Theresa Roberts is a 35 y.o. female  Chief Complaint  Patient presents with  . Back Pain    Patient states that her back pain has gotten just a little bit better, she has started seeing a chiropractor. She would like to discuss how to tolerate the pain    Physical therapy was not helpful. Started with a chiropractor and that is very helpful, but she is not sure how long she can do it because of cost. She notes that her back is no better. She saw sports medicine and they noted that her scoliosis was severe and was causing some nerve impingement. She still is working the same job and has to sit all the time. They have not given her a different chair and have not had an ergonomic evaluation, despite our requests. She has not been able to tolerate most medications for her back pain, but her sports medicine doctor gave her some hydrocodone and that seems like it would help. Didn't make her too sleepy. She notes that she still feels aching with sharp shooting pains. She is otherwise doing well with no other concerns or complaints at this time.   Relevant past medical, surgical, family and social history reviewed and updated as indicated. Interim medical history since our last visit reviewed. Allergies and medications reviewed and updated.  Review of Systems  Constitutional: Negative.   Respiratory: Negative.   Cardiovascular: Negative.   Musculoskeletal: Positive for myalgias, back pain and arthralgias. Negative for joint swelling, gait problem, neck pain and neck stiffness.  Skin: Negative.   Psychiatric/Behavioral: Negative.     Per HPI unless specifically indicated above     Objective:    BP 98/66 mmHg  Pulse 75  Temp(Src) 98.6 F (37  C)  Ht 5\' 4"  (1.626 m)  Wt 156 lb (70.761 kg)  BMI 26.76 kg/m2  SpO2 99%  LMP  (LMP Unknown)  Wt Readings from Last 3 Encounters:  01/23/16 156 lb (70.761 kg)  09/11/15 150 lb (68.04 kg)  08/15/15 153 lb (69.4 kg)    Physical Exam  Constitutional: She is oriented to person, place, and time. She appears well-developed and well-nourished. No distress.  HENT:  Head: Normocephalic and atraumatic.  Right Ear: Hearing normal.  Left Ear: Hearing normal.  Nose: Nose normal.  Eyes: Conjunctivae and lids are normal. Right eye exhibits no discharge. Left eye exhibits no discharge. No scleral icterus.  Cardiovascular: Normal rate, regular rhythm, normal heart sounds and intact distal pulses.  Exam reveals no gallop and no friction rub.   No murmur heard. Pulmonary/Chest: Effort normal and breath sounds normal. No respiratory distress. She has no wheezes. She has no rales. She exhibits no tenderness.  Musculoskeletal: She exhibits tenderness.  Decreased ROM to low back, + scoliosis, tenderness to palpation   Neurological: She is alert and oriented to person, place, and time.  Skin: Skin is warm, dry and intact. No rash noted. No erythema. No pallor.  Psychiatric: She has a normal mood and affect. Her speech is normal and behavior is normal. Judgment and thought content normal. Cognition and memory are normal.  Nursing note and vitals  reviewed.   Results for orders placed or performed in visit on 08/15/15  GC/Chlamydia Probe Amp  Result Value Ref Range   Chlamydia trachomatis, NAA Negative Negative   Neisseria gonorrhoeae by PCR Negative Negative  WET PREP FOR TRICH, YEAST, CLUE  Result Value Ref Range   Trichomonas Exam Negative Negative   Yeast Exam Negative Negative   Clue Cell Exam Negative Negative  HIV antibody  Result Value Ref Range   HIV Screen 4th Generation wRfx Non Reactive Non Reactive  Hepatitis, Acute  Result Value Ref Range   Hep A IgM Negative Negative   Hepatitis B  Surface Ag Negative Negative   Hep B C IgM Negative Negative   Hep C Virus Ab <0.1 0.0 - 0.9 s/co ratio  HSV(herpes simplex vrs) 1+2 ab-IgG  Result Value Ref Range   HSV 1 Glycoprotein G Ab, IgG 40.70 (H) 0.00 - 0.90 index   HSV 2 Glycoprotein G Ab, IgG 6.24 (H) 0.00 - 0.90 index  RPR  Result Value Ref Range   RPR Ser Ql Non Reactive Non Reactive  Pregnancy, urine  Result Value Ref Range   Preg Test, Ur Negative Negative      Assessment & Plan:   Problem List Items Addressed This Visit      Musculoskeletal and Integument   Scoliosis - Primary    Pain is worse. Will continue with current regimen. Small amount of hydrocodone PRN. Referral to spine specialist. Follow up 6-8 weeks.       Relevant Orders   Ambulatory referral to Spine Surgery       Follow up plan: Return 6-8 weeks.

## 2016-02-12 ENCOUNTER — Other Ambulatory Visit (HOSPITAL_COMMUNITY): Payer: Self-pay | Admitting: Neurological Surgery

## 2016-02-12 DIAGNOSIS — M4300 Spondylolysis, site unspecified: Secondary | ICD-10-CM

## 2016-03-01 ENCOUNTER — Ambulatory Visit
Admission: RE | Admit: 2016-03-01 | Discharge: 2016-03-01 | Disposition: A | Discharge status: Home / Self Care | Payer: 59 | Source: Ambulatory Visit | Attending: Neurological Surgery | Admitting: Neurological Surgery

## 2016-03-01 ENCOUNTER — Encounter: Payer: Self-pay | Admitting: Radiology

## 2016-03-01 DIAGNOSIS — M5147 Schmorl's nodes, lumbosacral region: Secondary | ICD-10-CM | POA: Diagnosis not present

## 2016-03-01 DIAGNOSIS — M4316 Spondylolisthesis, lumbar region: Secondary | ICD-10-CM | POA: Insufficient documentation

## 2016-03-01 DIAGNOSIS — M4300 Spondylolysis, site unspecified: Secondary | ICD-10-CM | POA: Diagnosis present

## 2016-03-01 DIAGNOSIS — M4186 Other forms of scoliosis, lumbar region: Secondary | ICD-10-CM | POA: Diagnosis not present

## 2016-03-12 ENCOUNTER — Encounter: Payer: Self-pay | Admitting: Family Medicine

## 2016-03-12 ENCOUNTER — Ambulatory Visit (INDEPENDENT_AMBULATORY_CARE_PROVIDER_SITE_OTHER): Payer: 59 | Admitting: Family Medicine

## 2016-03-12 VITALS — BP 102/67 | HR 69 | Temp 98.8°F | Wt 159.0 lb

## 2016-03-12 DIAGNOSIS — N3000 Acute cystitis without hematuria: Secondary | ICD-10-CM | POA: Diagnosis not present

## 2016-03-12 DIAGNOSIS — M5441 Lumbago with sciatica, right side: Secondary | ICD-10-CM | POA: Diagnosis not present

## 2016-03-12 MED ORDER — CYCLOBENZAPRINE HCL 10 MG PO TABS: 10.0000 mg | ORAL_TABLET | Freq: Three times a day (TID) | ORAL | 1 refills | Status: DC | PRN

## 2016-03-12 MED ORDER — CIPROFLOXACIN HCL 250 MG PO TABS: 250.0000 mg | ORAL_TABLET | Freq: Two times a day (BID) | ORAL | 0 refills | Status: DC

## 2016-03-12 MED ORDER — HYDROCODONE-ACETAMINOPHEN 5-325 MG PO TABS: 1.0000 | ORAL_TABLET | Freq: Four times a day (QID) | ORAL | 0 refills | Status: DC | PRN

## 2016-03-12 NOTE — Assessment & Plan Note (Signed)
No better. Will try some flexeril to see if that helps the muscle spasms. Continue pain medicine at night. Watch tylenol intake. Follow up with spine surgery. Call with any concerns. Recheck 2 months.

## 2016-03-12 NOTE — Progress Notes (Signed)
BP 102/67 (BP Location: Left Arm, Patient Position: Sitting, Cuff Size: Normal)   Pulse 69   Temp 98.8 F (37.1 C)   Wt 159 lb (72.1 kg)   SpO2 100%   BMI 27.29 kg/m    Subjective:    Patient ID: Theresa Roberts, female    DOB: February 08, 1981, 35 y.o.   MRN: [id number]  HPI: Theresa Roberts is a 35 y.o. female  Chief Complaint  Patient presents with  . Back Pain   Back is hurting a lot right now. She has been taking the ibuprofen and tylenol during the day, and then taking the pain medicine at night, which seems to be helping, but not making her too drowsy, which is why she can't take it during the day. She notes that for the last week she has been having a lot of R sided back pain outside of her spine and her usual pain in her bones and joints. She denies increased urination or burning when she pees. She has not noticed any blood. She has had no vaginal discharge. She notes that her muscles feel really tight as well. She had her MRI done last week, but hasn't heard anything from the spine doctor and is not sure when she is going to see them again. She notes that when she is walking now she is getting a sharp pain that shoots down her R leg. She is otherwise feeling well with no other concerns or complaints.    Relevant past medical, surgical, family and social history reviewed and updated as indicated. Interim medical history since our last visit reviewed. Allergies and medications reviewed and updated.  Review of Systems  Constitutional: Negative.   Respiratory: Negative.   Cardiovascular: Negative.   Psychiatric/Behavioral: Negative.     Per HPI unless specifically indicated above     Objective:    BP 102/67 (BP Location: Left Arm, Patient Position: Sitting, Cuff Size: Normal)   Pulse 69   Temp 98.8 F (37.1 C)   Wt 159 lb (72.1 kg)   SpO2 100%   BMI 27.29 kg/m   Wt Readings from Last 3 Encounters:  03/12/16 159 lb (72.1 kg)  01/23/16 156 lb (70.8 kg)  09/11/15  150 lb (68 kg)    Physical Exam  Constitutional: She is oriented to person, place, and time. She appears well-developed and well-nourished. No distress.  HENT:  Head: Normocephalic and atraumatic.  Right Ear: Hearing normal.  Left Ear: Hearing normal.  Nose: Nose normal.  Eyes: Conjunctivae and lids are normal. Right eye exhibits no discharge. Left eye exhibits no discharge. No scleral icterus.  Pulmonary/Chest: Effort normal. No respiratory distress.  Musculoskeletal: She exhibits tenderness. She exhibits no deformity.  Neurological: She is alert and oriented to person, place, and time. She has normal reflexes. She displays normal reflexes. No cranial nerve deficit. She exhibits normal muscle tone. Coordination normal.  Skin: Skin is warm, dry and intact. No rash noted. She is not diaphoretic. No erythema. No pallor.  Psychiatric: She has a normal mood and affect. Her speech is normal and behavior is normal. Judgment and thought content normal. Cognition and memory are normal.  Nursing note and vitals reviewed.   Results for orders placed or performed in visit on 08/15/15  GC/Chlamydia Probe Amp  Result Value Ref Range   Chlamydia trachomatis, NAA Negative Negative   Neisseria gonorrhoeae by PCR Negative Negative  WET PREP FOR TRICH, YEAST, CLUE  Result Value Ref Range   Trichomonas  Exam Negative Negative   Yeast Exam Negative Negative   Clue Cell Exam Negative Negative  HIV antibody  Result Value Ref Range   HIV Screen 4th Generation wRfx Non Reactive Non Reactive  Hepatitis, Acute  Result Value Ref Range   Hep A IgM Negative Negative   Hepatitis B Surface Ag Negative Negative   Hep B C IgM Negative Negative   Hep C Virus Ab <0.1 0.0 - 0.9 s/co ratio  HSV(herpes simplex vrs) 1+2 ab-IgG  Result Value Ref Range   HSV 1 Glycoprotein G Ab, IgG 40.70 (H) 0.00 - 0.90 index   HSV 2 Glycoprotein G Ab, IgG 6.24 (H) 0.00 - 0.90 index  RPR  Result Value Ref Range   RPR Ser Ql Non  Reactive Non Reactive  Pregnancy, urine  Result Value Ref Range   Preg Test, Ur Negative Negative      Assessment & Plan:   Problem List Items Addressed This Visit      Other   Low back pain - Primary    No better. Will try some flexeril to see if that helps the muscle spasms. Continue pain medicine at night. Watch tylenol intake. Follow up with spine surgery. Call with any concerns. Recheck 2 months.       Relevant Medications   ibuprofen (ADVIL,MOTRIN) 600 MG tablet   HYDROcodone-acetaminophen (NORCO) 5-325 MG tablet   cyclobenzaprine (FLEXERIL) 10 MG tablet   Other Relevant Orders   UA/M w/rflx Culture, Routine    Other Visit Diagnoses    Acute cystitis without hematuria       Mild UTI. Will treat with cipro. Call if not getting better or getting worse. Unlikely the cause of her back pain.       Follow up plan: Return in about 2 months (around 05/12/2016) for follow up back.

## 2016-03-16 LAB — UA/M W/RFLX CULTURE, ROUTINE
Bilirubin, UA: NEGATIVE
GLUCOSE, UA: NEGATIVE
Ketones, UA: NEGATIVE
NITRITE UA: NEGATIVE
PH UA: 6.5 (ref 5.0–7.5)
PROTEIN UA: NEGATIVE
RBC, UA: NEGATIVE
Urobilinogen, Ur: 0.2 mg/dL (ref 0.2–1.0)

## 2016-03-16 LAB — MICROSCOPIC EXAMINATION: RBC, UA: NONE SEEN /hpf (ref 0–?)

## 2016-03-16 LAB — URINE CULTURE, REFLEX

## 2016-03-17 ENCOUNTER — Telehealth: Payer: Self-pay | Admitting: Family Medicine

## 2016-03-17 NOTE — Telephone Encounter (Signed)
Please let Theresa Roberts know that her urine grew out a bacteria that doesn't cause infections. Nothing to worry about. If she's not feeling better, let me know

## 2016-03-17 NOTE — Telephone Encounter (Signed)
Discussed with Theresa Roberts- she is still having some pain. Will push water today and we'll call her tomorrow to see how she's doing. If not better by tomorrow PM, will start different abx. Will call her back tomorrow.

## 2016-03-18 MED ORDER — AMOXICILLIN 500 MG PO CAPS: 500.0000 mg | ORAL_CAPSULE | Freq: Two times a day (BID) | ORAL | 0 refills | Status: DC

## 2016-03-18 NOTE — Telephone Encounter (Signed)
Spoke with Deirdre, still not better. Will treat with amoxicillin. Call if not better when done with treatment.

## 2016-03-26 ENCOUNTER — Encounter: Payer: Self-pay | Admitting: Family Medicine

## 2016-03-26 ENCOUNTER — Ambulatory Visit (INDEPENDENT_AMBULATORY_CARE_PROVIDER_SITE_OTHER): Payer: 59 | Admitting: Family Medicine

## 2016-03-26 VITALS — BP 104/78 | HR 83 | Temp 98.7°F | Wt 156.0 lb

## 2016-03-26 DIAGNOSIS — N898 Other specified noninflammatory disorders of vagina: Secondary | ICD-10-CM | POA: Diagnosis not present

## 2016-03-26 LAB — UA/M W/RFLX CULTURE, ROUTINE
Bilirubin, UA: NEGATIVE
Glucose, UA: NEGATIVE
Ketones, UA: NEGATIVE
LEUKOCYTES UA: NEGATIVE
Nitrite, UA: NEGATIVE
PH UA: 7 (ref 5.0–7.5)
PROTEIN UA: NEGATIVE
SPEC GRAV UA: 1.01 (ref 1.005–1.030)
Urobilinogen, Ur: 0.2 mg/dL (ref 0.2–1.0)

## 2016-03-26 LAB — MICROSCOPIC EXAMINATION

## 2016-03-26 LAB — WET PREP FOR TRICH, YEAST, CLUE
CLUE CELL EXAM: NEGATIVE
Trichomonas Exam: NEGATIVE
YEAST EXAM: NEGATIVE

## 2016-03-26 NOTE — Progress Notes (Signed)
BP 104/78 (BP Location: Left Arm, Patient Position: Sitting, Cuff Size: Normal)   Pulse 83   Temp 98.7 F (37.1 C)   Wt 156 lb (70.8 kg)   SpO2 100%   BMI 26.78 kg/m    Subjective:    Patient ID: Theresa Roberts, female    DOB: 01-31-1981, 35 y.o.   MRN: [id number]  HPI: Theresa Roberts is a 35 y.o. female  Chief Complaint  Patient presents with  . Vaginal Discharge   Notes that she has been having some issues. Has been having discharge right before her period. This is new for her. Concerned that she has some sort of bacterial over growth. Concerned that she and her girlfriend are passing it back and forth. No fevers. No chills. No issues with her urine.   VAGINAL DISCHARGE Duration: couple of days Discharge description: brown blood  Pruritus: yes Dysuria: no Malodorous: yes Urinary frequency: no Fevers: no Abdominal pain: no  Sexual activity: monogamous History of sexually transmitted diseases: no Recent antibiotic use: yes Context: recurrent BV  Treatments attempted: none    Relevant past medical, surgical, family and social history reviewed and updated as indicated. Interim medical history since our last visit reviewed. Allergies and medications reviewed and updated.  Review of Systems  Constitutional: Negative.   Respiratory: Negative.   Cardiovascular: Negative.   Gastrointestinal: Negative.   Genitourinary: Positive for vaginal discharge. Negative for decreased urine volume, difficulty urinating, dyspareunia, dysuria, enuresis, flank pain, frequency, genital sores, hematuria, menstrual problem, pelvic pain, urgency, vaginal bleeding and vaginal pain.  Psychiatric/Behavioral: Negative.     Per HPI unless specifically indicated above     Objective:    BP 104/78 (BP Location: Left Arm, Patient Position: Sitting, Cuff Size: Normal)   Pulse 83   Temp 98.7 F (37.1 C)   Wt 156 lb (70.8 kg)   SpO2 100%   BMI 26.78 kg/m   Wt Readings from Last 3  Encounters:  03/26/16 156 lb (70.8 kg)  03/12/16 159 lb (72.1 kg)  01/23/16 156 lb (70.8 kg)    Physical Exam  Constitutional: She is oriented to person, place, and time. She appears well-developed and well-nourished. No distress.  HENT:  Head: Normocephalic and atraumatic.  Right Ear: Hearing normal.  Left Ear: Hearing normal.  Nose: Nose normal.  Eyes: Conjunctivae and lids are normal. Right eye exhibits no discharge. Left eye exhibits no discharge. No scleral icterus.  Pulmonary/Chest: Effort normal. No respiratory distress.  Abdominal: Soft. She exhibits no distension. There is no tenderness. There is no rebound and no guarding. Hernia confirmed negative in the right inguinal area and confirmed negative in the left inguinal area.  Genitourinary: No labial fusion. There is no rash, tenderness, lesion or injury on the right labia. There is no rash, tenderness, lesion or injury on the left labia. No erythema, tenderness or bleeding in the vagina. No foreign body in the vagina. No signs of injury around the vagina. Vaginal discharge found.  Musculoskeletal: Normal range of motion.  Neurological: She is alert and oriented to person, place, and time.  Skin: Skin is warm, dry and intact. No rash noted. No erythema. No pallor.  Psychiatric: She has a normal mood and affect. Her speech is normal and behavior is normal. Judgment and thought content normal. Cognition and memory are normal.    Results for orders placed or performed in visit on 03/12/16  Microscopic Examination  Result Value Ref Range   WBC, UA 0-5 0 -  5 /hpf   RBC, UA None seen 0 - 2 /hpf   Epithelial Cells (non renal) 0-10 0 - 10 /hpf   Bacteria, UA Moderate (A) None seen/Few  UA/M w/rflx Culture, Routine  Result Value Ref Range   Specific Gravity, UA <1.005 (L) 1.005 - 1.030   pH, UA 6.5 5.0 - 7.5   Color, UA Yellow Yellow   Appearance Ur Cloudy (A) Clear   Leukocytes, UA 1+ (A) Negative   Protein, UA Negative  Negative/Trace   Glucose, UA Negative Negative   Ketones, UA Negative Negative   RBC, UA Negative Negative   Bilirubin, UA Negative Negative   Urobilinogen, Ur 0.2 0.2 - 1.0 mg/dL   Nitrite, UA Negative Negative   Microscopic Examination See below:    Urinalysis Reflex Comment   Urine Culture, Routine  Result Value Ref Range   Urine Culture, Routine Final report    Urine Culture result 1 Lactobacillus species       Assessment & Plan:   Problem List Items Addressed This Visit    None    Visit Diagnoses    Vaginal discharge    -  Primary   Negative wet prep and urine. Discussed probiotics. Discussed avoiding irritants. Cotton panties and sleeping without panties. Call with concerns.    Relevant Orders   UA/M w/rflx Culture, Routine   WET PREP FOR TRICH, YEAST, CLUE       Follow up plan: Return if symptoms worsen or fail to improve.

## 2016-05-13 ENCOUNTER — Ambulatory Visit (INDEPENDENT_AMBULATORY_CARE_PROVIDER_SITE_OTHER): Payer: 59 | Admitting: Family Medicine

## 2016-05-13 ENCOUNTER — Encounter: Payer: Self-pay | Admitting: Family Medicine

## 2016-05-13 VITALS — BP 103/70 | HR 74 | Temp 98.9°F | Wt 154.0 lb

## 2016-05-13 DIAGNOSIS — M533 Sacrococcygeal disorders, not elsewhere classified: Secondary | ICD-10-CM | POA: Diagnosis not present

## 2016-05-13 MED ORDER — CYCLOBENZAPRINE HCL 10 MG PO TABS: 10.0000 mg | ORAL_TABLET | Freq: Every day | ORAL | 1 refills | Status: DC

## 2016-05-13 MED ORDER — HYDROCODONE-ACETAMINOPHEN 5-325 MG PO TABS: 1.0000 | ORAL_TABLET | Freq: Four times a day (QID) | ORAL | 0 refills | Status: DC | PRN

## 2016-05-13 NOTE — Assessment & Plan Note (Signed)
To see orthopedics later this month. Refills of her medicine given today. Recheck 3 months at physical.

## 2016-05-13 NOTE — Progress Notes (Signed)
BP 103/70   Pulse 74   Temp 98.9 F (37.2 C)   Wt 154 lb (69.9 kg)   SpO2 99%   BMI 26.43 kg/m    Subjective:    Patient ID: Theresa Roberts, female    DOB: 1981-03-18, 35 y.o.   MRN: [id number]  HPI: Theresa Roberts is a 35 y.o. female  Chief Complaint  Patient presents with  . Back Pain    she states it's about the same. Some days are really bad.    BACK PAIN- seeing the orthopedic on 10/20, seeing Dr. Erlinda Hong, Got a standing desk, working on getting used to it, but it's taking time, going to get a better chair, just needs to get into the workshop to get one, hasn't happened yet.  Duration: Chronic Mechanism of injury: no trauma Location: bilateral and low back Onset: gradual Severity: moderate Quality: aching and throbbing Frequency: constant Radiation: none Aggravating factors: bending and prolonged sitting Alleviating factors: rest, ice, heat, laying, NSAIDs, APAP, narcotics and muscle relaxer Status: stable Treatments attempted: rest, ice, heat, APAP, ibuprofen, aleve, physical therapy and HEP  Relief with NSAIDs?: mild Nighttime pain:  no Paresthesias / decreased sensation:  no Bowel / bladder incontinence:  no Fevers:  no Dysuria / urinary frequency:  no  Relevant past medical, surgical, family and social history reviewed and updated as indicated. Interim medical history since our last visit reviewed. Allergies and medications reviewed and updated.  Review of Systems  Constitutional: Negative.   Respiratory: Negative.   Cardiovascular: Negative.   Musculoskeletal: Positive for back pain and myalgias. Negative for arthralgias, gait problem, joint swelling, neck pain and neck stiffness.  Neurological: Negative.   Psychiatric/Behavioral: Negative.     Per HPI unless specifically indicated above     Objective:    BP 103/70   Pulse 74   Temp 98.9 F (37.2 C)   Wt 154 lb (69.9 kg)   SpO2 99%   BMI 26.43 kg/m   Wt Readings from Last 3 Encounters:    05/13/16 154 lb (69.9 kg)  03/26/16 156 lb (70.8 kg)  03/12/16 159 lb (72.1 kg)    Physical Exam  Constitutional: She is oriented to person, place, and time. She appears well-developed and well-nourished. No distress.  HENT:  Head: Normocephalic and atraumatic.  Right Ear: Hearing normal.  Left Ear: Hearing normal.  Nose: Nose normal.  Eyes: Conjunctivae and lids are normal. Right eye exhibits no discharge. Left eye exhibits no discharge. No scleral icterus.  Cardiovascular: Normal rate, regular rhythm, normal heart sounds and intact distal pulses.  Exam reveals no gallop and no friction rub.   No murmur heard. Pulmonary/Chest: Effort normal and breath sounds normal. No respiratory distress. She has no wheezes. She has no rales. She exhibits no tenderness.  Musculoskeletal: Normal range of motion.  Neurological: She is alert and oriented to person, place, and time.  Skin: Skin is warm, dry and intact. No rash noted. No erythema. No pallor.  Psychiatric: She has a normal mood and affect. Her speech is normal and behavior is normal. Judgment and thought content normal. Cognition and memory are normal.  Nursing note and vitals reviewed.   Results for orders placed or performed in visit on 03/26/16  Microscopic Examination  Result Value Ref Range   WBC, UA 0-5 0 - 5 /hpf   RBC, UA 0-2 0 - 2 /hpf   Epithelial Cells (non renal) 0-10 0 - 10 /hpf   Bacteria, UA Few  None seen/Few  WET PREP FOR TRICH, YEAST, CLUE  Result Value Ref Range   Trichomonas Exam Negative Negative   Yeast Exam Negative Negative   Clue Cell Exam Negative Negative  UA/M w/rflx Culture, Routine  Result Value Ref Range   Specific Gravity, UA 1.010 1.005 - 1.030   pH, UA 7.0 5.0 - 7.5   Color, UA Yellow Yellow   Appearance Ur Cloudy (A) Clear   Leukocytes, UA Negative Negative   Protein, UA Negative Negative/Trace   Glucose, UA Negative Negative   Ketones, UA Negative Negative   RBC, UA 3+ (A) Negative    Bilirubin, UA Negative Negative   Urobilinogen, Ur 0.2 0.2 - 1.0 mg/dL   Nitrite, UA Negative Negative   Microscopic Examination See below:       Assessment & Plan:   Problem List Items Addressed This Visit      Other   Sacralgia - Primary    To see orthopedics later this month. Refills of her medicine given today. Recheck 3 months at physical.       Relevant Medications   HYDROcodone-acetaminophen (NORCO) 5-325 MG tablet   cyclobenzaprine (FLEXERIL) 10 MG tablet    Other Visit Diagnoses   None.      Follow up plan: Return in about 3 months (around 08/13/2016) for Physical.

## 2016-05-28 ENCOUNTER — Ambulatory Visit (INDEPENDENT_AMBULATORY_CARE_PROVIDER_SITE_OTHER): Payer: 59 | Admitting: Orthopaedic Surgery

## 2016-05-28 DIAGNOSIS — M545 Low back pain: Secondary | ICD-10-CM | POA: Diagnosis not present

## 2016-08-13 ENCOUNTER — Ambulatory Visit (INDEPENDENT_AMBULATORY_CARE_PROVIDER_SITE_OTHER): Payer: 59 | Admitting: Family Medicine

## 2016-08-13 ENCOUNTER — Encounter: Payer: Self-pay | Admitting: Family Medicine

## 2016-08-13 VITALS — BP 99/65 | HR 87 | Temp 97.9°F | Ht 64.25 in | Wt 157.7 lb

## 2016-08-13 DIAGNOSIS — Z0000 Encounter for general adult medical examination without abnormal findings: Secondary | ICD-10-CM

## 2016-08-13 DIAGNOSIS — Z23 Encounter for immunization: Secondary | ICD-10-CM | POA: Diagnosis not present

## 2016-08-13 DIAGNOSIS — G8929 Other chronic pain: Secondary | ICD-10-CM | POA: Diagnosis not present

## 2016-08-13 DIAGNOSIS — Z0001 Encounter for general adult medical examination with abnormal findings: Secondary | ICD-10-CM

## 2016-08-13 DIAGNOSIS — M5441 Lumbago with sciatica, right side: Secondary | ICD-10-CM | POA: Diagnosis not present

## 2016-08-13 MED ORDER — IBUPROFEN 600 MG PO TABS: 600.0000 mg | ORAL_TABLET | Freq: Four times a day (QID) | ORAL | 12 refills | Status: DC

## 2016-08-13 MED ORDER — HYDROCODONE-ACETAMINOPHEN 5-325 MG PO TABS: 1.0000 | ORAL_TABLET | Freq: Four times a day (QID) | ORAL | 0 refills | Status: DC | PRN

## 2016-08-13 NOTE — Progress Notes (Signed)
BP 99/65 (BP Location: Left Arm, Patient Position: Sitting, Cuff Size: Normal)   Pulse 87   Temp 97.9 F (36.6 C)   Ht 5' 4.25" (1.632 m)   Wt 157 lb 11.2 oz (71.5 kg)   SpO2 99%   BMI 26.86 kg/m    Subjective:    Patient ID: Theresa Roberts, female    DOB: [birth year], 36 y.o.   MRN: [id number]  HPI: Theresa Roberts is a 36 y.o. female presenting on 08/13/2016 for comprehensive medical examination. Current medical complaints include: back is still hurting. Would like to get a heating pad at work. Needs paperwork filled out.   She currently lives with: girlfriend and her kids Menopausal Symptoms: no  Depression Screen done today and results listed below:  Depression screen PHQ 2/9 08/13/2016  Decreased Interest 0  Down, Depressed, Hopeless 0  PHQ - 2 Score 0    Past Medical History:  Past Medical History:  Diagnosis Date  . Scoliosis     Surgical History:  Past Surgical History:  Procedure Laterality Date  . PLACEMENT OF BREAST IMPLANTS      Medications:  Current Outpatient Prescriptions on File Prior to Visit  Medication Sig  . acetaminophen (TYLENOL) 500 MG tablet Take 500 mg by mouth every 6 (six) hours as needed.  Marland Kitchen acyclovir (ZOVIRAX) 800 MG tablet   . cyclobenzaprine (FLEXERIL) 10 MG tablet Take 1 tablet (10 mg total) by mouth at bedtime.  . naproxen (NAPROSYN) 500 MG tablet Take 500 mg by mouth 2 (two) times daily with a meal.   No current facility-administered medications on file prior to visit.     Allergies:  No Known Allergies  Social History:  Social History   Social History  . Marital status: Single    Spouse name: N/A  . Number of children: N/A  . Years of education: N/A   Occupational History  . Not on file.   Social History Main Topics  . Smoking status: Never Smoker  . Smokeless tobacco: Never Used  . Alcohol use No  . Drug use: No  . Sexual activity: Yes    Birth control/ protection: Implant   Other Topics Concern  . Not on  file   Social History Narrative  . No narrative on file   History  Smoking Status  . Never Smoker  Smokeless Tobacco  . Never Used   History  Alcohol Use No    Family History:  Family History  Problem Relation Age of Onset  . Asthma Son   . Heart attack Maternal Grandfather     Past medical history, surgical history, medications, allergies, family history and social history reviewed with patient today and changes made to appropriate areas of the chart.   Review of Systems  Constitutional: Positive for chills. Negative for diaphoresis, fever, malaise/fatigue and weight loss.  HENT: Negative.   Eyes: Negative.   Respiratory: Negative.   Cardiovascular: Negative.   Gastrointestinal: Positive for abdominal pain (sharp cramping pain, thinks that it might be period cramping due to her implant). Negative for blood in stool, constipation, diarrhea, heartburn, melena, nausea and vomiting.  Genitourinary: Negative.   Musculoskeletal: Positive for back pain. Negative for falls, joint pain, myalgias and neck pain.  Skin: Negative.        Has a mole on her back that has started to itch  Neurological: Negative.  Negative for weakness.  Endo/Heme/Allergies: Negative.   Psychiatric/Behavioral: Negative.     All other ROS negative  except what is listed above and in the HPI.      Objective:    BP 99/65 (BP Location: Left Arm, Patient Position: Sitting, Cuff Size: Normal)   Pulse 87   Temp 97.9 F (36.6 C)   Ht 5' 4.25" (1.632 m)   Wt 157 lb 11.2 oz (71.5 kg)   SpO2 99%   BMI 26.86 kg/m   Wt Readings from Last 3 Encounters:  08/13/16 157 lb 11.2 oz (71.5 kg)  05/13/16 154 lb (69.9 kg)  03/26/16 156 lb (70.8 kg)    Physical Exam  Constitutional: She is oriented to person, place, and time. She appears well-developed and well-nourished. No distress.  HENT:  Head: Normocephalic and atraumatic.  Right Ear: Hearing and external ear normal.  Left Ear: Hearing and external ear  normal.  Nose: Nose normal.  Mouth/Throat: Oropharynx is clear and moist. No oropharyngeal exudate.  Eyes: Conjunctivae, EOM and lids are normal. Pupils are equal, round, and reactive to light. Right eye exhibits no discharge. Left eye exhibits no discharge. No scleral icterus.  Neck: Normal range of motion. Neck supple. No JVD present. No tracheal deviation present. No thyromegaly present.  Cardiovascular: Normal rate, regular rhythm, normal heart sounds and intact distal pulses.  Exam reveals no gallop and no friction rub.   No murmur heard. Pulmonary/Chest: Effort normal and breath sounds normal. No stridor. No respiratory distress. She has no wheezes. She has no rales. She exhibits no tenderness. Right breast exhibits no inverted nipple, no mass, no nipple discharge, no skin change and no tenderness. Left breast exhibits no inverted nipple, no mass, no nipple discharge, no skin change and no tenderness. Breasts are symmetrical.  Abdominal: Soft. Bowel sounds are normal. She exhibits no distension and no mass. There is no tenderness. There is no rebound and no guarding.  Genitourinary:  Genitourinary Comments: Pelvic exam deferred with shared decision making  Musculoskeletal: Normal range of motion. She exhibits no edema, tenderness or deformity.  Lymphadenopathy:    She has no cervical adenopathy.  Neurological: She is alert and oriented to person, place, and time. She has normal reflexes. She displays normal reflexes. No cranial nerve deficit. She exhibits normal muscle tone. Coordination normal.  Skin: Skin is warm, dry and intact. No rash noted. She is not diaphoretic. No erythema. No pallor.     Psychiatric: She has a normal mood and affect. Her speech is normal and behavior is normal. Judgment and thought content normal. Cognition and memory are normal.  Nursing note and vitals reviewed.   Results for orders placed or performed in visit on 03/26/16  Microscopic Examination  Result  Value Ref Range   WBC, UA 0-5 0 - 5 /hpf   RBC, UA 0-2 0 - 2 /hpf   Epithelial Cells (non renal) 0-10 0 - 10 /hpf   Bacteria, UA Few None seen/Few  WET PREP FOR TRICH, YEAST, CLUE  Result Value Ref Range   Trichomonas Exam Negative Negative   Yeast Exam Negative Negative   Clue Cell Exam Negative Negative  UA/M w/rflx Culture, Routine  Result Value Ref Range   Specific Gravity, UA 1.010 1.005 - 1.030   pH, UA 7.0 5.0 - 7.5   Color, UA Yellow Yellow   Appearance Ur Cloudy (A) Clear   Leukocytes, UA Negative Negative   Protein, UA Negative Negative/Trace   Glucose, UA Negative Negative   Ketones, UA Negative Negative   RBC, UA 3+ (A) Negative   Bilirubin, UA Negative Negative  Urobilinogen, Ur 0.2 0.2 - 1.0 mg/dL   Nitrite, UA Negative Negative   Microscopic Examination See below:       Assessment & Plan:   Problem List Items Addressed This Visit      Other   Low back pain    Stable. Continue current regimen. Recheck 6 months as needed. Refill given today.      Relevant Medications   ibuprofen (ADVIL,MOTRIN) 600 MG tablet   HYDROcodone-acetaminophen (NORCO) 5-325 MG tablet    Other Visit Diagnoses    Routine general medical examination at a health care facility    -  Primary   Up to date on vaccines. Screening labs checked today. Pap up to date. Continue diet and exercise. Call with any concerns.    Relevant Orders   CBC with Differential/Platelet   Comprehensive metabolic panel   Lipid Panel w/o Chol/HDL Ratio   TSH   UA/M w/rflx Culture, Routine   Need for vaccine for TD (tetanus-diphtheria)       Td given today.   Relevant Orders   Td vaccine greater than or equal to 7yo preservative free IM       Follow up plan: Return ASAP, for Shave biopsy on her back.   LABORATORY TESTING:  - Pap smear: up to date  IMMUNIZATIONS:   - Tdap: Tetanus vaccination status reviewed: td updated today - Influenza: Refused - Pneumovax: Not applicable  PATIENT  COUNSELING:   Advised to take 1 mg of folate supplement per day if capable of pregnancy.   Sexuality: Discussed sexually transmitted diseases, partner selection, use of condoms, avoidance of unintended pregnancy  and contraceptive alternatives.   Advised to avoid cigarette smoking.  I discussed with the patient that most people either abstain from alcohol or drink within safe limits (<=14/week and <=4 drinks/occasion for males, <=7/weeks and <= 3 drinks/occasion for females) and that the risk for alcohol disorders and other health effects rises proportionally with the number of drinks per week and how often a drinker exceeds daily limits.  Discussed cessation/primary prevention of drug use and availability of treatment for abuse.   Diet: Encouraged to adjust caloric intake to maintain  or achieve ideal body weight, to reduce intake of dietary saturated fat and total fat, to limit sodium intake by avoiding high sodium foods and not adding table salt, and to maintain adequate dietary potassium and calcium preferably from fresh fruits, vegetables, and low-fat dairy products.    stressed the importance of regular exercise  Injury prevention: Discussed safety belts, safety helmets, smoke detector, smoking near bedding or upholstery.   Dental health: Discussed importance of regular tooth brushing, flossing, and dental visits.    NEXT PREVENTATIVE PHYSICAL DUE IN 1 YEAR. Return ASAP, for Shave biopsy on her back.

## 2016-08-13 NOTE — Patient Instructions (Addendum)
Health Maintenance, Female Introduction Adopting a healthy lifestyle and getting preventive care can go a long way to promote health and wellness. Talk with your health care provider about what schedule of regular examinations is right for you. This is a good chance for you to check in with your provider about disease prevention and staying healthy. In between checkups, there are plenty of things you can do on your own. Experts have done a lot of research about which lifestyle changes and preventive measures are most likely to keep you healthy. Ask your health care provider for more information. Weight and diet Eat a healthy diet  Be sure to include plenty of vegetables, fruits, low-fat dairy products, and lean protein.  Do not eat a lot of foods high in solid fats, added sugars, or salt.  Get regular exercise. This is one of the most important things you can do for your health.  Most adults should exercise for at least 150 minutes each week. The exercise should increase your heart rate and make you sweat (moderate-intensity exercise).  Most adults should also do strengthening exercises at least twice a week. This is in addition to the moderate-intensity exercise. Maintain a healthy weight  Body mass index (BMI) is a measurement that can be used to identify possible weight problems. It estimates body fat based on height and weight. Your health care provider can help determine your BMI and help you achieve or maintain a healthy weight.  For females 63 years of age and older:  A BMI below 18.5 is considered underweight.  A BMI of 18.5 to 24.9 is normal.  A BMI of 25 to 29.9 is considered overweight.  A BMI of 30 and above is considered obese. Watch levels of cholesterol and blood lipids  You should start having your blood tested for lipids and cholesterol at 34 years of age, then have this test every 5 years.  You may need to have your cholesterol levels checked more often if:  Your  lipid or cholesterol levels are high.  You are older than 34 years of age.  You are at high risk for heart disease. Cancer screening Lung Cancer  Lung cancer screening is recommended for adults 56-22 years old who are at high risk for lung cancer because of a history of smoking.  A yearly low-dose CT scan of the lungs is recommended for people who:  Currently smoke.  Have quit within the past 15 years.  Have at least a 30-pack-year history of smoking. A pack year is smoking an average of one pack of cigarettes a day for 1 year.  Yearly screening should continue until it has been 15 years since you quit.  Yearly screening should stop if you develop a health problem that would prevent you from having lung cancer treatment. Breast Cancer  Practice breast self-awareness. This means understanding how your breasts normally appear and feel.  It also means doing regular breast self-exams. Let your health care provider know about any changes, no matter how small.  If you are in your 20s or 30s, you should have a clinical breast exam (CBE) by a health care provider every 1-3 years as part of a regular health exam.  If you are 35 or older, have a CBE every year. Also consider having a breast X-ray (mammogram) every year.  If you have a family history of breast cancer, talk to your health care provider about genetic screening.  If you are at high risk for breast cancer,  talk to your health care provider about having an MRI and a mammogram every year.  Breast cancer gene (BRCA) assessment is recommended for women who have family members with BRCA-related cancers. BRCA-related cancers include:  Breast.  Ovarian.  Tubal.  Peritoneal cancers.  Results of the assessment will determine the need for genetic counseling and BRCA1 and BRCA2 testing. Cervical Cancer  Your health care provider may recommend that you be screened regularly for cancer of the pelvic organs (ovaries, uterus, and  vagina). This screening involves a pelvic examination, including checking for microscopic changes to the surface of your cervix (Pap test). You may be encouraged to have this screening done every 3 years, beginning at age 21.  For women ages 30-65, health care providers may recommend pelvic exams and Pap testing every 3 years, or they may recommend the Pap and pelvic exam, combined with testing for human papilloma virus (HPV), every 5 years. Some types of HPV increase your risk of cervical cancer. Testing for HPV may also be done on women of any age with unclear Pap test results.  Other health care providers may not recommend any screening for nonpregnant women who are considered low risk for pelvic cancer and who do not have symptoms. Ask your health care provider if a screening pelvic exam is right for you.  If you have had past treatment for cervical cancer or a condition that could lead to cancer, you need Pap tests and screening for cancer for at least 20 years after your treatment. If Pap tests have been discontinued, your risk factors (such as having a new sexual partner) need to be reassessed to determine if screening should resume. Some women have medical problems that increase the chance of getting cervical cancer. In these cases, your health care provider may recommend more frequent screening and Pap tests. Colorectal Cancer  This type of cancer can be detected and often prevented.  Routine colorectal cancer screening usually begins at 34 years of age and continues through 34 years of age.  Your health care provider may recommend screening at an earlier age if you have risk factors for colon cancer.  Your health care provider may also recommend using home test kits to check for hidden blood in the stool.  A small camera at the end of a tube can be used to examine your colon directly (sigmoidoscopy or colonoscopy). This is done to check for the earliest forms of colorectal  cancer.  Routine screening usually begins at age 50.  Direct examination of the colon should be repeated every 5-10 years through 34 years of age. However, you may need to be screened more often if early forms of precancerous polyps or small growths are found. Skin Cancer  Check your skin from head to toe regularly.  Tell your health care provider about any new moles or changes in moles, especially if there is a change in a mole's shape or color.  Also tell your health care provider if you have a mole that is larger than the size of a pencil eraser.  Always use sunscreen. Apply sunscreen liberally and repeatedly throughout the day.  Protect yourself by wearing long sleeves, pants, a wide-brimmed hat, and sunglasses whenever you are outside. Heart disease, diabetes, and high blood pressure  High blood pressure causes heart disease and increases the risk of stroke. High blood pressure is more likely to develop in:  People who have blood pressure in the high end of the normal range (130-139/85-89 mm Hg).    People who are overweight or obese.  People who are African American.  If you are 18-39 years of age, have your blood pressure checked every 3-5 years. If you are 40 years of age or older, have your blood pressure checked every year. You should have your blood pressure measured twice-once when you are at a hospital or clinic, and once when you are not at a hospital or clinic. Record the average of the two measurements. To check your blood pressure when you are not at a hospital or clinic, you can use:  An automated blood pressure machine at a pharmacy.  A home blood pressure monitor.  If you are between 55 years and 79 years old, ask your health care provider if you should take aspirin to prevent strokes.  Have regular diabetes screenings. This involves taking a blood sample to check your fasting blood sugar level.  If you are at a normal weight and have a low risk for diabetes,  have this test once every three years after 34 years of age.  If you are overweight and have a high risk for diabetes, consider being tested at a younger age or more often. Preventing infection Hepatitis B  If you have a higher risk for hepatitis B, you should be screened for this virus. You are considered at high risk for hepatitis B if:  You were born in a country where hepatitis B is common. Ask your health care provider which countries are considered high risk.  Your parents were born in a high-risk country, and you have not been immunized against hepatitis B (hepatitis B vaccine).  You have HIV or AIDS.  You use needles to inject street drugs.  You live with someone who has hepatitis B.  You have had sex with someone who has hepatitis B.  You get hemodialysis treatment.  You take certain medicines for conditions, including cancer, organ transplantation, and autoimmune conditions. Hepatitis C  Blood testing is recommended for:  Everyone born from 1945 through 1965.  Anyone with known risk factors for hepatitis C. Sexually transmitted infections (STIs)  You should be screened for sexually transmitted infections (STIs) including gonorrhea and chlamydia if:  You are sexually active and are younger than 34 years of age.  You are older than 34 years of age and your health care provider tells you that you are at risk for this type of infection.  Your sexual activity has changed since you were last screened and you are at an increased risk for chlamydia or gonorrhea. Ask your health care provider if you are at risk.  If you do not have HIV, but are at risk, it may be recommended that you take a prescription medicine daily to prevent HIV infection. This is called pre-exposure prophylaxis (PrEP). You are considered at risk if:  You are sexually active and do not regularly use condoms or know the HIV status of your partner(s).  You take drugs by injection.  You are sexually  active with a partner who has HIV. Talk with your health care provider about whether you are at high risk of being infected with HIV. If you choose to begin PrEP, you should first be tested for HIV. You should then be tested every 3 months for as long as you are taking PrEP. Pregnancy  If you are premenopausal and you may become pregnant, ask your health care provider about preconception counseling.  If you may become pregnant, take 400 to 800 micrograms (mcg) of folic acid   every day.  If you want to prevent pregnancy, talk to your health care provider about birth control (contraception). Osteoporosis and menopause  Osteoporosis is a disease in which the bones lose minerals and strength with aging. This can result in serious bone fractures. Your risk for osteoporosis can be identified using a bone density scan.  If you are 33 years of age or older, or if you are at risk for osteoporosis and fractures, ask your health care provider if you should be screened.  Ask your health care provider whether you should take a calcium or vitamin D supplement to lower your risk for osteoporosis.  Menopause may have certain physical symptoms and risks.  Hormone replacement therapy may reduce some of these symptoms and risks. Talk to your health care provider about whether hormone replacement therapy is right for you. Follow these instructions at home:  Schedule regular health, dental, and eye exams.  Stay current with your immunizations.  Do not use any tobacco products including cigarettes, chewing tobacco, or electronic cigarettes.  If you are pregnant, do not drink alcohol.  If you are breastfeeding, limit how much and how often you drink alcohol.  Limit alcohol intake to no more than 1 drink per day for nonpregnant women. One drink equals 12 ounces of beer, 5 ounces of wine, or 1 ounces of hard liquor.  Do not use street drugs.  Do not share needles.  Ask your health care provider for  help if you need support or information about quitting drugs.  Tell your health care provider if you often feel depressed.  Tell your health care provider if you have ever been abused or do not feel safe at home. This information is not intended to replace advice given to you by your health care provider. Make sure you discuss any questions you have with your health care provider. Document Released: 02/08/2011 Document Revised: 01/01/2016 Document Reviewed: 04/29/2015  2017 Elsevier Td Vaccine (Tetanus and Diphtheria): What You Need to Know 1. Why get vaccinated? Tetanus  and diphtheria are very serious diseases. They are rare in the [place] today, but people who do become infected often have severe complications. Td vaccine is used to protect adolescents and adults from both of these diseases. Both tetanus and diphtheria are infections caused by bacteria. Diphtheria spreads from person to person through coughing or sneezing. Tetanus-causing bacteria enter the body through cuts, scratches, or wounds. TETANUS (lockjaw) causes painful muscle tightening and stiffness, usually all over the body.  It can lead to tightening of muscles in the head and neck so you can't open your mouth, swallow, or sometimes even breathe. Tetanus kills about 1 out of every 10 people who are infected even after receiving the best medical care. DIPHTHERIA can cause a thick coating to form in the back of the throat.  It can lead to breathing problems, paralysis, heart failure, and death. Before vaccines, as many as 200,000 cases of diphtheria and hundreds of cases of tetanus were reported in the [place] each year. Since vaccination began, reports of cases for both diseases have dropped by about 99%. 2. Td vaccine Td vaccine can protect adolescents and adults from tetanus and diphtheria. Td is usually given as a booster dose every 10 years but it can also be given earlier after a severe and dirty wound or  burn. Another vaccine, called Tdap, which protects against pertussis in addition to tetanus and diphtheria, is sometimes recommended instead of Td vaccine. Your doctor or the  person giving you the vaccine can give you more information. Td may safely be given at the same time as other vaccines. 3. Some people should not get this vaccine  A person who has ever had a life-threatening allergic reaction after a previous dose of any tetanus or diphtheria containing vaccine, OR has a severe allergy to any part of this vaccine, should not get Td vaccine. Tell the person giving the vaccine about any severe allergies.  Talk to your doctor if you:  had severe pain or swelling after any vaccine containing diphtheria or tetanus,  ever had a condition called Guillain Barre Syndrome (GBS),  aren't feeling well on the day the shot is scheduled. 4. What are the risks from Td vaccine? With any medicine, including vaccines, there is a chance of side effects. These are usually mild and go away on their own. Serious reactions are also possible but are rare. Most people who get Td vaccine do not have any problems with it. Mild problems following Td vaccine: (Did not interfere with activities)  Pain where the shot was given (about 8 people in 10)  Redness or swelling where the shot was given (about 1 person in 4)  Mild fever (rare)  Headache (about 1 person in 4)  Tiredness (about 1 person in 4) Moderate problems following Td vaccine: (Interfered with activities, but did not require medical attention)  Fever over 102F (rare) Severe problems following Td vaccine: (Unable to perform usual activities; required medical attention)  Swelling, severe pain, bleeding and/or redness in the arm where the shot was given (rare). Problems that could happen after any vaccine:  People sometimes faint after a medical procedure, including vaccination. Sitting or lying down for about 15 minutes can help prevent  fainting, and injuries caused by a fall. Tell your doctor if you feel dizzy, or have vision changes or ringing in the ears.  Some people get severe pain in the shoulder and have difficulty moving the arm where a shot was given. This happens very rarely.  Any medication can cause a severe allergic reaction. Such reactions from a vaccine are very rare, estimated at fewer than 1 in a million doses, and would happen within a few minutes to a few hours after the vaccination. As with any medicine, there is a very remote chance of a vaccine causing a serious injury or death. The safety of vaccines is always being monitored. For more information, visit: http://www.aguilar.org/ 5. What if there is a serious reaction? What should I look for? Look for anything that concerns you, such as signs of a severe allergic reaction, very high fever, or unusual behavior. Signs of a severe allergic reaction can include hives, swelling of the face and throat, difficulty breathing, a fast heartbeat, dizziness, and weakness. These would usually start a few minutes to a few hours after the vaccination. What should I do?  If you think it is a severe allergic reaction or other emergency that can't wait, call 9-1-1 or get the person to the nearest hospital. Otherwise, call your doctor.  Afterward, the reaction should be reported to the Vaccine Adverse Event Reporting System (VAERS). Your doctor might file this report, or you can do it yourself through the VAERS web site at www.vaers.SamedayNews.es, or by calling 989-840-2488.  VAERS does not give medical advice. 6. The National Vaccine Injury Compensation Program The National Vaccine Injury Compensation Program (VICP) is a federal program that was created to compensate people who may have been injured by certain  vaccines. Persons who believe they may have been injured by a vaccine can learn about the program and about filing a claim by calling (276)223-7833 or visiting the  [facility] website at GoldCloset.com.ee. There is a time limit to file a claim for compensation. 7. How can I learn more?  Ask your doctor. He or she can give you the vaccine package insert or suggest other sources of information.  Call your local or state health department.  Contact the Centers for Disease Control and Prevention (CDC):  Call 639-467-2831 (1-800-CDC-INFO)  Visit CDC's website at http://hunter.com/ CDC Td Vaccine VIS (11/18/15) This information is not intended to replace advice given to you by your health care provider. Make sure you discuss any questions you have with your health care provider. Document Released: 05/23/2006 Document Revised: 04/15/2016 Document Reviewed: 04/15/2016 Elsevier Interactive Patient Education  2017 [employer].

## 2016-08-13 NOTE — Assessment & Plan Note (Signed)
Stable. Continue current regimen. Recheck 6 months as needed. Refill given today.

## 2016-08-14 LAB — CBC WITH DIFFERENTIAL/PLATELET
BASOS ABS: 0 10*3/uL (ref 0.0–0.2)
BASOS: 1 %
EOS (ABSOLUTE): 0.1 10*3/uL (ref 0.0–0.4)
Eos: 2 %
HEMOGLOBIN: 13.5 g/dL (ref 11.1–15.9)
Hematocrit: 39.5 % (ref 34.0–46.6)
IMMATURE GRANS (ABS): 0 10*3/uL (ref 0.0–0.1)
Immature Granulocytes: 0 %
Lymphocytes Absolute: 1.7 10*3/uL (ref 0.7–3.1)
Lymphs: 26 %
MCH: 31 pg (ref 26.6–33.0)
MCHC: 34.2 g/dL (ref 31.5–35.7)
MCV: 91 fL (ref 79–97)
MONOCYTES: 9 %
Monocytes Absolute: 0.6 10*3/uL (ref 0.1–0.9)
NEUTROS ABS: 4.1 10*3/uL (ref 1.4–7.0)
NEUTROS PCT: 62 %
Platelets: 323 10*3/uL (ref 150–379)
RBC: 4.35 x10E6/uL (ref 3.77–5.28)
RDW: 13.1 % (ref 12.3–15.4)
WBC: 6.5 10*3/uL (ref 3.4–10.8)

## 2016-08-14 LAB — COMPREHENSIVE METABOLIC PANEL
ALBUMIN: 4.5 g/dL (ref 3.5–5.5)
ALT: 12 IU/L (ref 0–32)
AST: 13 IU/L (ref 0–40)
Albumin/Globulin Ratio: 1.7 (ref 1.2–2.2)
Alkaline Phosphatase: 54 IU/L (ref 39–117)
BILIRUBIN TOTAL: 0.3 mg/dL (ref 0.0–1.2)
BUN / CREAT RATIO: 17 (ref 9–23)
BUN: 10 mg/dL (ref 6–20)
CO2: 26 mmol/L (ref 18–29)
CREATININE: 0.58 mg/dL (ref 0.57–1.00)
Calcium: 9.2 mg/dL (ref 8.7–10.2)
Chloride: 99 mmol/L (ref 96–106)
GFR, EST AFRICAN AMERICAN: 140 mL/min/{1.73_m2} (ref >59)
GFR, EST NON AFRICAN AMERICAN: 121 mL/min/{1.73_m2} (ref >59)
GLUCOSE: 93 mg/dL (ref 65–99)
Globulin, Total: 2.7 g/dL (ref 1.5–4.5)
Potassium: 4.5 mmol/L (ref 3.5–5.2)
Sodium: 140 mmol/L (ref 134–144)
Total Protein: 7.2 g/dL (ref 6.0–8.5)

## 2016-08-14 LAB — LIPID PANEL W/O CHOL/HDL RATIO
Cholesterol, Total: 163 mg/dL (ref 100–199)
HDL: 78 mg/dL (ref >39)
LDL CALC: 70 mg/dL (ref 0–99)
Triglycerides: 73 mg/dL (ref 0–149)
VLDL CHOLESTEROL CAL: 15 mg/dL (ref 5–40)

## 2016-08-14 LAB — TSH: TSH: 1.17 u[IU]/mL (ref 0.450–4.500)

## 2016-08-15 LAB — MICROSCOPIC EXAMINATION: RBC, UA: NONE SEEN /hpf (ref 0–?)

## 2016-08-15 LAB — UA/M W/RFLX CULTURE, ROUTINE
BILIRUBIN UA: NEGATIVE
GLUCOSE, UA: NEGATIVE
KETONES UA: NEGATIVE
Nitrite, UA: NEGATIVE
Protein, UA: NEGATIVE
SPEC GRAV UA: 1.02 (ref 1.005–1.030)
Urobilinogen, Ur: 0.2 mg/dL (ref 0.2–1.0)
pH, UA: 6.5 (ref 5.0–7.5)

## 2016-08-15 LAB — URINE CULTURE, REFLEX: ORGANISM ID, BACTERIA: NO GROWTH

## 2016-08-18 ENCOUNTER — Telehealth: Payer: Self-pay | Admitting: Family Medicine

## 2016-08-18 NOTE — Telephone Encounter (Signed)
Please let her know we've faxed a copy of her form to work, but there's a copy up front if she needs it.

## 2016-08-19 NOTE — Telephone Encounter (Signed)
Called patient to give her the information from Dr Wynetta Emery.  Patient states she plans to come by the office to pick up the form. Santiago Glad

## 2016-08-27 ENCOUNTER — Ambulatory Visit (INDEPENDENT_AMBULATORY_CARE_PROVIDER_SITE_OTHER): Payer: 59 | Admitting: Family Medicine

## 2016-08-27 ENCOUNTER — Encounter: Payer: Self-pay | Admitting: Family Medicine

## 2016-08-27 VITALS — BP 110/66 | HR 83 | Temp 98.7°F | Wt 162.8 lb

## 2016-08-27 DIAGNOSIS — D485 Neoplasm of uncertain behavior of skin: Secondary | ICD-10-CM

## 2016-08-27 NOTE — Progress Notes (Signed)
BP 110/66 (BP Location: Left Arm, Patient Position: Sitting, Cuff Size: Normal)   Pulse 83   Temp 98.7 F (37.1 C)   Wt 162 lb 12.8 oz (73.8 kg) Comment: pt had shoes on  SpO2 99%   BMI 27.73 kg/m    Subjective:    Patient ID: Theresa Roberts, female    DOB: 04-22-1981, 36 y.o.   MRN: [id number]  HPI: Theresa Roberts is a 36 y.o. female  Chief Complaint  Patient presents with  . Nevus    mole removal on back   SKIN LESION Duration: chronic Location: upper L back Painful: no Itching: yes Onset: gradual Context: changing Associated signs and symptoms: itching History of skin cancer: no History of precancerous skin lesions: no Family history of skin cancer: no  Relevant past medical, surgical, family and social history reviewed and updated as indicated. Interim medical history since our last visit reviewed. Allergies and medications reviewed and updated.  Review of Systems  Constitutional: Negative.   Respiratory: Negative.   Cardiovascular: Negative.   Skin: Negative for color change, pallor, rash and wound.  Psychiatric/Behavioral: Negative.     Per HPI unless specifically indicated above     Objective:    BP 110/66 (BP Location: Left Arm, Patient Position: Sitting, Cuff Size: Normal)   Pulse 83   Temp 98.7 F (37.1 C)   Wt 162 lb 12.8 oz (73.8 kg) Comment: pt had shoes on  SpO2 99%   BMI 27.73 kg/m   Wt Readings from Last 3 Encounters:  08/27/16 162 lb 12.8 oz (73.8 kg)  08/13/16 157 lb 11.2 oz (71.5 kg)  05/13/16 154 lb (69.9 kg)    Physical Exam  Constitutional: She is oriented to person, place, and time. She appears well-developed and well-nourished. No distress.  HENT:  Head: Normocephalic and atraumatic.  Right Ear: Hearing normal.  Left Ear: Hearing normal.  Nose: Nose normal.  Eyes: Conjunctivae and lids are normal. Right eye exhibits no discharge. Left eye exhibits no discharge. No scleral icterus.  Pulmonary/Chest: Effort normal. No  respiratory distress.  Musculoskeletal: Normal range of motion.  Neurological: She is alert and oriented to person, place, and time.  Skin: Skin is warm, dry and intact. No rash noted. No erythema. No pallor.  0.5cm flesh colored lesion with area of darker areas in it.   Psychiatric: She has a normal mood and affect. Her speech is normal and behavior is normal. Judgment and thought content normal. Cognition and memory are normal.  Nursing note and vitals reviewed.   Results for orders placed or performed in visit on 08/13/16  Microscopic Examination  Result Value Ref Range   WBC, UA 6-10 (A) 0 - 5 /hpf   RBC, UA None seen 0 - 2 /hpf   Epithelial Cells (non renal) 0-10 0 - 10 /hpf   Mucus, UA Present (A) Not Estab.   Bacteria, UA Many (A) None seen/Few  CBC with Differential/Platelet  Result Value Ref Range   WBC 6.5 3.4 - 10.8 x10E3/uL   RBC 4.35 3.77 - 5.28 x10E6/uL   Hemoglobin 13.5 11.1 - 15.9 g/dL   Hematocrit 39.5 34.0 - 46.6 %   MCV 91 79 - 97 fL   MCH 31.0 26.6 - 33.0 pg   MCHC 34.2 31.5 - 35.7 g/dL   RDW 13.1 12.3 - 15.4 %   Platelets 323 150 - 379 x10E3/uL   Neutrophils 62 Not Estab. %   Lymphs 26 Not Estab. %  Monocytes 9 Not Estab. %   Eos 2 Not Estab. %   Basos 1 Not Estab. %   Neutrophils Absolute 4.1 1.4 - 7.0 x10E3/uL   Lymphocytes Absolute 1.7 0.7 - 3.1 x10E3/uL   Monocytes Absolute 0.6 0.1 - 0.9 x10E3/uL   EOS (ABSOLUTE) 0.1 0.0 - 0.4 x10E3/uL   Basophils Absolute 0.0 0.0 - 0.2 x10E3/uL   Immature Granulocytes 0 Not Estab. %   Immature Grans (Abs) 0.0 0.0 - 0.1 x10E3/uL  Comprehensive metabolic panel  Result Value Ref Range   Glucose 93 65 - 99 mg/dL   BUN 10 6 - 20 mg/dL   Creatinine, Ser 0.58 0.57 - 1.00 mg/dL   GFR calc non Af Amer 121 >59 mL/min/1.73   GFR calc Af Amer 140 >59 mL/min/1.73   BUN/Creatinine Ratio 17 9 - 23   Sodium 140 134 - 144 mmol/L   Potassium 4.5 3.5 - 5.2 mmol/L   Chloride 99 96 - 106 mmol/L   CO2 26 18 - 29 mmol/L    Calcium 9.2 8.7 - 10.2 mg/dL   Total Protein 7.2 6.0 - 8.5 g/dL   Albumin 4.5 3.5 - 5.5 g/dL   Globulin, Total 2.7 1.5 - 4.5 g/dL   Albumin/Globulin Ratio 1.7 1.2 - 2.2   Bilirubin Total 0.3 0.0 - 1.2 mg/dL   Alkaline Phosphatase 54 39 - 117 IU/L   AST 13 0 - 40 IU/L   ALT 12 0 - 32 IU/L  Lipid Panel w/o Chol/HDL Ratio  Result Value Ref Range   Cholesterol, Total 163 100 - 199 mg/dL   Triglycerides 73 0 - 149 mg/dL   HDL 78 >39 mg/dL   VLDL Cholesterol Cal 15 5 - 40 mg/dL   LDL Calculated 70 0 - 99 mg/dL  TSH  Result Value Ref Range   TSH 1.170 0.450 - 4.500 uIU/mL  UA/M w/rflx Culture, Routine  Result Value Ref Range   Specific Gravity, UA 1.020 1.005 - 1.030   pH, UA 6.5 5.0 - 7.5   Color, UA Yellow Yellow   Appearance Ur Cloudy (A) Clear   Leukocytes, UA 1+ (A) Negative   Protein, UA Negative Negative/Trace   Glucose, UA Negative Negative   Ketones, UA Negative Negative   RBC, UA Trace (A) Negative   Bilirubin, UA Negative Negative   Urobilinogen, Ur 0.2 0.2 - 1.0 mg/dL   Nitrite, UA Negative Negative   Microscopic Examination See below:    Urinalysis Reflex Comment   Urine Culture, Routine  Result Value Ref Range   Urine Culture, Routine Final report    Urine Culture result 1 No growth       Assessment & Plan:   Problem List Items Addressed This Visit    None    Visit Diagnoses    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 Await results. Call with any concerns.    Relevant Orders   Pathology Report      Skin Procedure  Procedure: Informed consent given.  Sterile prep of the area.  Area infiltrated with lidocaine with epinephrine.  Using a  dermblade, part of the upper dermis shaved off and sent  for pathology.  Area cauterized. Pt ed on scarring     Diagnosis:   ICD-9-CM ICD-10-CM   1. 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 Await results. Call  with any  concerns.     Lesion Location/Size: Physician: MJ Consent:  Risks, benefits, and alternative treatments discussed and all questions were answered.  Patient elected to proceed and verbal consent obtained.  Description: Area prepped and draped using semi-sterile technique. Area locally anesthetized using 3.0 cc's of lidocaine 1% with epi. Shave biopsy of lesion performed using a dermablade.  Adequate hemostastis achieved using electrocautery.  Follow up plan: Return if symptoms worsen or fail to improve.

## 2016-08-31 ENCOUNTER — Telehealth: Payer: Self-pay | Admitting: Family Medicine

## 2016-08-31 LAB — PATHOLOGY

## 2016-08-31 NOTE — Telephone Encounter (Signed)
Patient notified

## 2016-08-31 NOTE — Telephone Encounter (Signed)
Please let Theresa Roberts know that her mole was just a mole. Thanks!

## 2016-09-06 ENCOUNTER — Telehealth: Payer: Self-pay | Admitting: Family Medicine

## 2016-09-07 NOTE — Telephone Encounter (Signed)
Spoke with patient. Stated that [employer] did not receive last page with signature. Form printed off Media tab and had provider signed and refaxed.

## 2017-03-15 ENCOUNTER — Encounter: Payer: Self-pay | Admitting: Family Medicine

## 2017-03-15 ENCOUNTER — Ambulatory Visit (INDEPENDENT_AMBULATORY_CARE_PROVIDER_SITE_OTHER): Payer: 59 | Admitting: Family Medicine

## 2017-03-15 VITALS — BP 101/67 | HR 79 | Temp 98.6°F | Wt 163.1 lb

## 2017-03-15 DIAGNOSIS — G8929 Other chronic pain: Secondary | ICD-10-CM | POA: Diagnosis not present

## 2017-03-15 DIAGNOSIS — R635 Abnormal weight gain: Secondary | ICD-10-CM | POA: Diagnosis not present

## 2017-03-15 DIAGNOSIS — M5441 Lumbago with sciatica, right side: Secondary | ICD-10-CM | POA: Diagnosis not present

## 2017-03-15 MED ORDER — OXYCODONE-ACETAMINOPHEN 5-300 MG PO TABS: 1.0000 | ORAL_TABLET | Freq: Two times a day (BID) | ORAL | 0 refills | Status: DC | PRN

## 2017-03-15 MED ORDER — ACYCLOVIR 800 MG PO TABS: 800.0000 mg | ORAL_TABLET | Freq: Two times a day (BID) | ORAL | 12 refills | Status: DC

## 2017-03-15 MED ORDER — CYCLOBENZAPRINE HCL 10 MG PO TABS: 10.0000 mg | ORAL_TABLET | Freq: Every day | ORAL | 1 refills | Status: DC

## 2017-03-15 NOTE — Assessment & Plan Note (Signed)
Stable. Continue flexeril. Will change to percocet to see if she is less tired on that. Advised aquatherapy and yoga for back pain. Call with any concerns. Recheck 6 months.

## 2017-03-15 NOTE — Progress Notes (Signed)
BP 101/67 (BP Location: Left Arm, Patient Position: Sitting, Cuff Size: Normal)   Pulse 79   Temp 98.6 F (37 C)   Wt 163 lb 2 oz (74 kg)   SpO2 100%   BMI 27.78 kg/m    Subjective:    Patient ID: Theresa Roberts, female    DOB: 1981-08-04, 36 y.o.   MRN: [id number]  HPI: Theresa Roberts is a 36 y.o. female  Chief Complaint  Patient presents with  . Back Pain  . Weight Gain  . Medication Refill    acyclovir   BACK PAIN- no better. No changes. Just got her last refill of her flexeril and she still has some of her vicodin. She notes that her back is still really bothering. She has seen several doctors and done PT. She saw a spine specialist, saw PT, saw sports med, had MRI- pain is worse with the rain and with sitting, better with movement Duration: chronic Location: midline and low back Onset: gradual Severity: severe Quality: sharp, aching Frequency: worse with sitting  Radiation: buttocks Aggravating factors: prolonged sitting Alleviating factors: rest, ice, heat, laying, NSAIDs, narcotics and muscle relaxer Status: stable Treatments attempted: rest, ice, heat, APAP, ibuprofen, aleve, physical therapy and HEP  Relief with NSAIDs?: mild Nighttime pain:  no Paresthesias / decreased sensation:  no Bowel / bladder incontinence:  no Fevers:  no Dysuria / urinary frequency:  no  WEIGHT GAIN Duration: chroinc Previous attempts at weight loss: yes Complications of obesity: none Peak weight: current Weight loss goal: 150 Weight loss to date: none  Relevant past medical, surgical, family and social history reviewed and updated as indicated. Interim medical history since our last visit reviewed. Allergies and medications reviewed and updated.  Review of Systems  Constitutional: Negative.   Respiratory: Negative.   Cardiovascular: Negative.   Musculoskeletal: Positive for back pain and myalgias. Negative for arthralgias, gait problem, joint swelling, neck pain and  neck stiffness.  Neurological: Negative.   Psychiatric/Behavioral: Negative.     Per HPI unless specifically indicated above     Objective:    BP 101/67 (BP Location: Left Arm, Patient Position: Sitting, Cuff Size: Normal)   Pulse 79   Temp 98.6 F (37 C)   Wt 163 lb 2 oz (74 kg)   SpO2 100%   BMI 27.78 kg/m   Wt Readings from Last 3 Encounters:  03/15/17 163 lb 2 oz (74 kg)  08/27/16 162 lb 12.8 oz (73.8 kg)  08/13/16 157 lb 11.2 oz (71.5 kg)    Physical Exam  Constitutional: She is oriented to person, place, and time. She appears well-developed and well-nourished. No distress.  HENT:  Head: Normocephalic and atraumatic.  Right Ear: Hearing normal.  Left Ear: Hearing normal.  Nose: Nose normal.  Eyes: Conjunctivae and lids are normal. Right eye exhibits no discharge. Left eye exhibits no discharge. No scleral icterus.  Cardiovascular: Normal rate, regular rhythm, normal heart sounds and intact distal pulses.  Exam reveals no gallop and no friction rub.   No murmur heard. Pulmonary/Chest: Effort normal and breath sounds normal. No respiratory distress. She has no wheezes. She has no rales. She exhibits no tenderness.  Musculoskeletal: Normal range of motion.  Neurological: She is alert and oriented to person, place, and time.  Skin: Skin is warm, dry and intact. No rash noted. No erythema. No pallor.  Psychiatric: She has a normal mood and affect. Her speech is normal and behavior is normal. Judgment and thought content  normal. Cognition and memory are normal.  Nursing note and vitals reviewed. Back Exam:    Inspection:  Normal spinal curvature.  No deformity, ecchymosis, erythema, or lesions     Palpation:     Midline spinal tenderness: yes lumbar      Paralumbar tenderness: yes bilateral     Parathoracic tenderness: no      Buttocks tenderness: yesbilateral     Range of Motion:      Flexion: Fingers to Knees     Extension:Decreased     Lateral bending:Decreased      Rotation:Decreased    Neuro Exam:Lower extremity DTRs normal & symmetric.  Strength and sensation intact.    Straight leg raise:negative    Results for orders placed or performed in visit on 08/27/16  Pathology Report  Result Value Ref Range   . Comment    . Comment    . Comment    . Comment    . Comment    . Comment    . Comment       Assessment & Plan:   Problem List Items Addressed This Visit      Other   Low back pain - Primary    Stable. Continue flexeril. Will change to percocet to see if she is less tired on that. Advised aquatherapy and yoga for back pain. Call with any concerns. Recheck 6 months.       Relevant Medications   oxycodone-acetaminophen (LYNOX) 5-300 MG tablet   cyclobenzaprine (FLEXERIL) 10 MG tablet    Other Visit Diagnoses    Weight gain       Work on diet and exercise. Call with any concerns.        Follow up plan: Return in about 5 months (around 08/15/2017) for Physical.

## 2017-03-15 NOTE — Patient Instructions (Signed)
Yoga for back pain- you tube Walk in the water as much as you can

## 2017-03-16 ENCOUNTER — Telehealth: Payer: Self-pay | Admitting: Family Medicine

## 2017-03-16 MED ORDER — OXYCODONE-ACETAMINOPHEN 5-325 MG PO TABS: 1.0000 | ORAL_TABLET | Freq: Two times a day (BID) | ORAL | 0 refills | Status: DC | PRN

## 2017-03-16 NOTE — Telephone Encounter (Signed)
Patient notified

## 2017-03-16 NOTE — Telephone Encounter (Signed)
Patient's pharmacy does not carry the oxycodone prescribed. Patient's pharmacy called around but other places do not carry the prescription. Patient does not want to change pharmacies.   Pharmacy stated the prescription for oxycodone needs to be changed to 5325.  Patient wanting to know if the medication changed if that is still okay.  Please Advise.  Thank you

## 2017-03-16 NOTE — Telephone Encounter (Signed)
Spoke with pharmacy, they need a new prescription for the correct dose.  Patient must still have the original copy.

## 2017-03-16 NOTE — Telephone Encounter (Signed)
Is this change ok? 

## 2017-03-16 NOTE — Telephone Encounter (Signed)
Rx written- will need old RX back before she can have this Rx

## 2017-03-16 NOTE — Telephone Encounter (Signed)
Yes please. That's what I thought I clicked.

## 2017-08-26 ENCOUNTER — Encounter: Payer: Self-pay | Admitting: Family Medicine

## 2017-08-26 ENCOUNTER — Ambulatory Visit: Payer: Managed Care, Other (non HMO) | Admitting: Family Medicine

## 2017-08-26 VITALS — BP 104/70 | HR 80 | Temp 98.4°F | Wt 165.4 lb

## 2017-08-26 DIAGNOSIS — Z13220 Encounter for screening for lipoid disorders: Secondary | ICD-10-CM

## 2017-08-26 DIAGNOSIS — R635 Abnormal weight gain: Secondary | ICD-10-CM | POA: Diagnosis not present

## 2017-08-26 DIAGNOSIS — G8929 Other chronic pain: Secondary | ICD-10-CM

## 2017-08-26 DIAGNOSIS — M5441 Lumbago with sciatica, right side: Secondary | ICD-10-CM | POA: Diagnosis not present

## 2017-08-26 MED ORDER — OXYCODONE-ACETAMINOPHEN 5-325 MG PO TABS: 1.0000 | ORAL_TABLET | Freq: Two times a day (BID) | ORAL | 0 refills | Status: DC | PRN

## 2017-08-26 MED ORDER — IBUPROFEN 600 MG PO TABS: 600.0000 mg | ORAL_TABLET | Freq: Four times a day (QID) | ORAL | 12 refills | Status: DC

## 2017-08-26 MED ORDER — ACYCLOVIR 800 MG PO TABS: 800.0000 mg | ORAL_TABLET | Freq: Two times a day (BID) | ORAL | 12 refills | Status: DC

## 2017-08-26 MED ORDER — CYCLOBENZAPRINE HCL 10 MG PO TABS: 10.0000 mg | ORAL_TABLET | Freq: Every day | ORAL | 1 refills | Status: DC

## 2017-08-26 NOTE — Assessment & Plan Note (Signed)
Stable. Continue current regimen. Continue to monitor. Call with any concerns. Rx for oxycodone today- Rx should last for 6 months.

## 2017-08-26 NOTE — Progress Notes (Signed)
BP 104/70 (BP Location: Left Arm, Patient Position: Sitting, Cuff Size: Normal)   Pulse 80   Temp 98.4 F (36.9 C)   Wt 165 lb 6.4 oz (75 kg)   SpO2 97%   BMI 28.17 kg/m    Subjective:    Patient ID: Theresa Roberts, female    DOB: March 07, 1981, 37 y.o.   MRN: [id number]  HPI: Theresa Roberts is a 37 y.o. female  Chief Complaint  Patient presents with  . Back Pain   BACK PAIN- no better. No changes. Just got her last refill of her flexeril and she still has some of her vicodin, but would like a refill today. She notes that her back is still really bothering her and she is not feeling any better. She has seen several doctors and done PT. She saw a spine specialist, saw PT, saw sports med, had MRI- pain is worse with the rain and with sitting, better with movement Duration: chronic Location: midline and low back Onset: gradual Severity: severe Quality: sharp, aching Frequency: worse with sitting  Radiation: buttocks Aggravating factors: prolonged sitting Alleviating factors: rest, ice, heat, laying, NSAIDs, narcotics and muscle relaxer Status: stable Treatments attempted: rest, ice, heat, APAP, ibuprofen, aleve, physical therapy and HEP  Relief with NSAIDs?: mild Nighttime pain:  no Paresthesias / decreased sensation:  no Bowel / bladder incontinence:  no Fevers:  no Dysuria / urinary frequency:  no  Relevant past medical, surgical, family and social history reviewed and updated as indicated. Interim medical history since our last visit reviewed. Allergies and medications reviewed and updated.  Review of Systems  Constitutional: Negative.   Respiratory: Negative.   Cardiovascular: Negative.   Musculoskeletal: Positive for back pain and myalgias. Negative for arthralgias, gait problem, joint swelling, neck pain and neck stiffness.  Skin: Negative.   Neurological: Negative.   Psychiatric/Behavioral: Negative.     Per HPI unless specifically indicated above       Objective:    BP 104/70 (BP Location: Left Arm, Patient Position: Sitting, Cuff Size: Normal)   Pulse 80   Temp 98.4 F (36.9 C)   Wt 165 lb 6.4 oz (75 kg)   SpO2 97%   BMI 28.17 kg/m   Wt Readings from Last 3 Encounters:  08/26/17 165 lb 6.4 oz (75 kg)  03/15/17 163 lb 2 oz (74 kg)  08/27/16 162 lb 12.8 oz (73.8 kg)    Physical Exam  Constitutional: She is oriented to person, place, and time. She appears well-developed and well-nourished. No distress.  HENT:  Head: Normocephalic and atraumatic.  Right Ear: Hearing normal.  Left Ear: Hearing normal.  Nose: Nose normal.  Eyes: Conjunctivae and lids are normal. Right eye exhibits no discharge. Left eye exhibits no discharge. No scleral icterus.  Cardiovascular: Normal rate, regular rhythm, normal heart sounds and intact distal pulses. Exam reveals no gallop and no friction rub.  No murmur heard. Pulmonary/Chest: Effort normal and breath sounds normal. No respiratory distress. She has no wheezes. She has no rales. She exhibits no tenderness.  Musculoskeletal: Normal range of motion.  Neurological: She is alert and oriented to person, place, and time.  Skin: Skin is warm, dry and intact. No rash noted. She is not diaphoretic. No erythema. No pallor.  Psychiatric: She has a normal mood and affect. Her speech is normal and behavior is normal. Judgment and thought content normal. Cognition and memory are normal.  Nursing note and vitals reviewed.      Assessment &  Plan:   Problem List Items Addressed This Visit      Other   Low back pain - Primary    Stable. Continue current regimen. Continue to monitor. Call with any concerns. Rx for oxycodone today- Rx should last for 6 months.       Relevant Medications   cyclobenzaprine (FLEXERIL) 10 MG tablet   ibuprofen (ADVIL,MOTRIN) 600 MG tablet   oxyCODONE-acetaminophen (PERCOCET/ROXICET) 5-325 MG tablet    Other Visit Diagnoses    Weight gain       Work on diet and exercise.  Labs drawn today. Await results.    Relevant Orders   Thyroid Panel With TSH   Comprehensive metabolic panel   CBC with Differential/Platelet   Screening for cholesterol level       Labs drawn today. Await results.    Relevant Orders   Lipid Panel w/o Chol/HDL Ratio       Follow up plan: Return in about 3 months (around 11/24/2017).

## 2017-09-02 ENCOUNTER — Other Ambulatory Visit: Payer: Managed Care, Other (non HMO)

## 2017-09-03 LAB — COMPREHENSIVE METABOLIC PANEL
ALBUMIN: 4.9 g/dL (ref 3.5–5.5)
ALT: 14 IU/L (ref 0–32)
AST: 15 IU/L (ref 0–40)
Albumin/Globulin Ratio: 2 (ref 1.2–2.2)
Alkaline Phosphatase: 56 IU/L (ref 39–117)
BILIRUBIN TOTAL: 0.2 mg/dL (ref 0.0–1.2)
BUN / CREAT RATIO: 14 (ref 9–23)
BUN: 9 mg/dL (ref 6–20)
CALCIUM: 9.4 mg/dL (ref 8.7–10.2)
CHLORIDE: 99 mmol/L (ref 96–106)
CO2: 23 mmol/L (ref 20–29)
CREATININE: 0.63 mg/dL (ref 0.57–1.00)
GFR, EST AFRICAN AMERICAN: 135 mL/min/{1.73_m2} (ref >59)
GFR, EST NON AFRICAN AMERICAN: 117 mL/min/{1.73_m2} (ref >59)
GLUCOSE: 85 mg/dL (ref 65–99)
Globulin, Total: 2.4 g/dL (ref 1.5–4.5)
Potassium: 4.2 mmol/L (ref 3.5–5.2)
Sodium: 139 mmol/L (ref 134–144)
TOTAL PROTEIN: 7.3 g/dL (ref 6.0–8.5)

## 2017-09-03 LAB — CBC WITH DIFFERENTIAL/PLATELET
BASOS ABS: 0 10*3/uL (ref 0.0–0.2)
Basos: 0 %
EOS (ABSOLUTE): 0.1 10*3/uL (ref 0.0–0.4)
Eos: 2 %
HEMOGLOBIN: 13.9 g/dL (ref 11.1–15.9)
Hematocrit: 40.4 % (ref 34.0–46.6)
Immature Grans (Abs): 0 10*3/uL (ref 0.0–0.1)
Immature Granulocytes: 0 %
LYMPHS ABS: 1.8 10*3/uL (ref 0.7–3.1)
Lymphs: 26 %
MCH: 31.4 pg (ref 26.6–33.0)
MCHC: 34.4 g/dL (ref 31.5–35.7)
MCV: 91 fL (ref 79–97)
MONOS ABS: 0.6 10*3/uL (ref 0.1–0.9)
Monocytes: 8 %
NEUTROS ABS: 4.4 10*3/uL (ref 1.4–7.0)
Neutrophils: 64 %
PLATELETS: 304 10*3/uL (ref 150–379)
RBC: 4.43 x10E6/uL (ref 3.77–5.28)
RDW: 12.9 % (ref 12.3–15.4)
WBC: 6.9 10*3/uL (ref 3.4–10.8)

## 2017-09-03 LAB — LIPID PANEL W/O CHOL/HDL RATIO
Cholesterol, Total: 180 mg/dL (ref 100–199)
HDL: 93 mg/dL (ref >39)
LDL CALC: 74 mg/dL (ref 0–99)
Triglycerides: 67 mg/dL (ref 0–149)
VLDL CHOLESTEROL CAL: 13 mg/dL (ref 5–40)

## 2017-09-03 LAB — THYROID PANEL WITH TSH
Free Thyroxine Index: 1.7 (ref 1.2–4.9)
T3 Uptake Ratio: 25 % (ref 24–39)
T4, Total: 6.9 ug/dL (ref 4.5–12.0)
TSH: 0.655 u[IU]/mL (ref 0.450–4.500)

## 2017-09-05 ENCOUNTER — Encounter: Payer: Self-pay | Admitting: Family Medicine

## 2017-12-02 ENCOUNTER — Encounter: Payer: Self-pay | Admitting: Family Medicine

## 2017-12-02 ENCOUNTER — Ambulatory Visit (INDEPENDENT_AMBULATORY_CARE_PROVIDER_SITE_OTHER): Payer: Managed Care, Other (non HMO) | Admitting: Family Medicine

## 2017-12-02 VITALS — BP 106/70 | HR 82 | Temp 98.9°F | Ht 63.7 in | Wt 163.3 lb

## 2017-12-02 DIAGNOSIS — G8929 Other chronic pain: Secondary | ICD-10-CM | POA: Diagnosis not present

## 2017-12-02 DIAGNOSIS — Z0000 Encounter for general adult medical examination without abnormal findings: Secondary | ICD-10-CM | POA: Diagnosis not present

## 2017-12-02 DIAGNOSIS — M5441 Lumbago with sciatica, right side: Secondary | ICD-10-CM | POA: Diagnosis not present

## 2017-12-02 DIAGNOSIS — Z124 Encounter for screening for malignant neoplasm of cervix: Secondary | ICD-10-CM

## 2017-12-02 MED ORDER — OXYCODONE-ACETAMINOPHEN 5-325 MG PO TABS: 1.0000 | ORAL_TABLET | Freq: Two times a day (BID) | ORAL | 0 refills | Status: DC | PRN

## 2017-12-02 MED ORDER — CYCLOBENZAPRINE HCL 10 MG PO TABS: 10.0000 mg | ORAL_TABLET | Freq: Every day | ORAL | 1 refills | Status: DC

## 2017-12-02 MED ORDER — GABAPENTIN 100 MG PO CAPS: 100.0000 mg | ORAL_CAPSULE | Freq: Every day | ORAL | 3 refills | Status: DC

## 2017-12-02 NOTE — Patient Instructions (Signed)

## 2017-12-02 NOTE — Progress Notes (Signed)
BP 106/70 (BP Location: Left Arm, Patient Position: Sitting, Cuff Size: Normal)   Pulse 82   Temp 98.9 F (37.2 C)   Ht 5' 3.7" (1.618 m)   Wt 163 lb 5 oz (74.1 kg)   SpO2 98%   BMI 28.30 kg/m    Subjective:    Patient ID: Theresa Roberts, female    DOB: 1980-11-01, 37 y.o.   MRN: [id number]  HPI: Theresa Roberts is a 37 y.o. female presenting on 12/02/2017 for comprehensive medical examination. Current medical complaints include:  BACK PAIN- doing about the same. Had to cut down on her ibuprofen as she was getting worse acid reflux. Taking aleve and tylenol. That has been helping. Has been taking the 1/2 or 1/4 of the flexeril. She still has some of her vicodin, but would like a refill today. She notes that her back is still really bothering her and she is not feeling any better. She has seen several doctors and done PT. She saw a spine specialist, saw PT, saw sports med, had MRI- pain is worse with the rain and with sitting, better with movement Duration:chronic Location: midline and low back Onset: gradual Severity: severe Quality: sharp, aching Frequency: worse with sitting Radiation: buttocks Aggravating factor: prolonged sitting Alleviating factors: rest, ice, heat, laying, NSAIDs, narcotics and muscle relaxer Status: stable Treatments attempted: rest, ice, heat, APAP, ibuprofen, aleve, physical therapy and HEP Relief with NSAIDs?: mild Nighttime pain:  no Paresthesias / decreased sensation:  no Bowel / bladder incontinence:  no Fevers:  no Dysuria / urinary frequency:  no  She currently lives with: girlfriend and kids Menopausal Symptoms: no  Depression Screen done today and results listed below:  Depression screen [facility] 2/9 12/02/2017 08/13/2016  Decreased Interest 0 0  Down, Depressed, Hopeless 0 0  PHQ - 2 Score 0 0  Altered sleeping 2 -  Tired, decreased energy 2 -  Change in appetite 0 -  Feeling bad or failure about yourself  0 -  Trouble concentrating 1  -  Moving slowly or fidgety/restless 2 -  Suicidal thoughts 0 -  PHQ-9 Score 7 -    Past Medical History:  Past Medical History:  Diagnosis Date  . Scoliosis     Surgical History:  Past Surgical History:  Procedure Laterality Date  . PLACEMENT OF BREAST IMPLANTS      Medications:  Current Outpatient Medications on File Prior to Visit  Medication Sig  . acetaminophen (TYLENOL) 650 MG CR tablet Take 650 mg by mouth every 8 (eight) hours as needed for pain.  . naproxen sodium (ALEVE) 220 MG tablet Take 220 mg by mouth.  Marland Kitchen acyclovir (ZOVIRAX) 800 MG tablet Take 1 tablet (800 mg total) by mouth 2 (two) times daily.   No current facility-administered medications on file prior to visit.     Allergies:  No Known Allergies  Social History:  Social History   Socioeconomic History  . Marital status: Single    Spouse name: Not on file  . Number of children: Not on file  . Years of education: Not on file  . Highest education level: Not on file  Occupational History  . Not on file  Social Needs  . Financial resource strain: Not on file  . Food insecurity:    Worry: Not on file    Inability: Not on file  . Transportation needs:    Medical: Not on file    Non-medical: Not on file  Tobacco Use  .  Smoking status: Never Smoker  . Smokeless tobacco: Never Used  Substance and Sexual Activity  . Alcohol use: No    Alcohol/week: 0.0 oz  . Drug use: No  . Sexual activity: Yes    Birth control/protection: Implant  Lifestyle  . Physical activity:    Days per week: Not on file    Minutes per session: Not on file  . Stress: Not on file  Relationships  . Social connections:    Talks on phone: Not on file    Gets together: Not on file    Attends religious service: Not on file    Active member of club or organization: Not on file    Attends meetings of clubs or organizations: Not on file    Relationship status: Not on file  . Intimate partner violence:    Fear of current or  ex partner: Not on file    Emotionally abused: Not on file    Physically abused: Not on file    Forced sexual activity: Not on file  Other Topics Concern  . Not on file  Social History Narrative  . Not on file   Social History   Tobacco Use  Smoking Status Never Smoker  Smokeless Tobacco Never Used   Social History   Substance and Sexual Activity  Alcohol Use No  . Alcohol/week: 0.0 oz    Family History:  Family History  Problem Relation Age of Onset  . Asthma Son   . Heart attack Maternal Grandfather     Past medical history, surgical history, medications, allergies, family history and social history reviewed with patient today and changes made to appropriate areas of the chart.   Review of Systems  Constitutional: Negative.   HENT: Negative.   Eyes: Negative.   Respiratory: Negative.   Cardiovascular: Negative.   Gastrointestinal: Positive for heartburn. Negative for abdominal pain, blood in stool, constipation, diarrhea, melena, nausea and vomiting.  Genitourinary: Negative.   Musculoskeletal: Positive for back pain, joint pain and myalgias. Negative for falls and neck pain.  Skin: Negative.   Neurological: Positive for tingling. Negative for dizziness, tremors, sensory change, speech change, focal weakness, seizures, loss of consciousness, weakness and headaches.  Endo/Heme/Allergies: Positive for environmental allergies. Negative for polydipsia. Does not bruise/bleed easily.  Psychiatric/Behavioral: Negative for depression, hallucinations, memory loss, substance abuse and suicidal ideas. The patient is nervous/anxious. The patient does not have insomnia.     All other ROS negative except what is listed above and in the HPI.      Objective:    BP 106/70 (BP Location: Left Arm, Patient Position: Sitting, Cuff Size: Normal)   Pulse 82   Temp 98.9 F (37.2 C)   Ht 5' 3.7" (1.618 m)   Wt 163 lb 5 oz (74.1 kg)   SpO2 98%   BMI 28.30 kg/m   Wt Readings from  Last 3 Encounters:  12/02/17 163 lb 5 oz (74.1 kg)  08/26/17 165 lb 6.4 oz (75 kg)  03/15/17 163 lb 2 oz (74 kg)    Physical Exam  Constitutional: She is oriented to person, place, and time. She appears well-developed and well-nourished. No distress.  HENT:  Head: Normocephalic and atraumatic.  Right Ear: Hearing, tympanic membrane, external ear and ear canal normal.  Left Ear: Hearing, tympanic membrane, external ear and ear canal normal.  Nose: Nose normal.  Mouth/Throat: Uvula is midline, oropharynx is clear and moist and mucous membranes are normal. No oropharyngeal exudate.  Eyes: Pupils are equal,  round, and reactive to light. Conjunctivae, EOM and lids are normal. Right eye exhibits no discharge. Left eye exhibits no discharge. No scleral icterus.  Neck: Normal range of motion. Neck supple. No JVD present. No tracheal deviation present. No thyromegaly present.  Cardiovascular: Normal rate, regular rhythm, normal heart sounds and intact distal pulses. Exam reveals no gallop and no friction rub.  No murmur heard. Pulmonary/Chest: Effort normal and breath sounds normal. No stridor. No respiratory distress. She has no wheezes. She has no rales. She exhibits no tenderness. Right breast exhibits no inverted nipple, no mass, no nipple discharge, no skin change and no tenderness. Left breast exhibits no inverted nipple, no mass, no nipple discharge, no skin change and no tenderness. No breast swelling, tenderness, discharge or bleeding. Breasts are symmetrical.  Abdominal: Soft. Bowel sounds are normal. She exhibits no distension and no mass. There is no tenderness. There is no rebound and no guarding. No hernia. Hernia confirmed negative in the right inguinal area and confirmed negative in the left inguinal area.  Genitourinary: Uterus normal. No breast swelling, tenderness, discharge or bleeding. No labial fusion. There is no rash, tenderness, lesion or injury on the right labia. There is no  rash, tenderness, lesion or injury on the left labia. Uterus is not deviated, not enlarged, not fixed and not tender. Cervix exhibits no motion tenderness, no discharge and no friability. Right adnexum displays no mass, no tenderness and no fullness. Left adnexum displays no mass, no tenderness and no fullness. There is bleeding (at the start of her period) in the vagina. No erythema or tenderness in the vagina. No foreign body in the vagina. No signs of injury around the vagina. No vaginal discharge found.  Musculoskeletal: Normal range of motion. She exhibits no edema, tenderness or deformity.  Lymphadenopathy:    She has no cervical adenopathy. No inguinal adenopathy noted on the right or left side.  Neurological: She is alert and oriented to person, place, and time. She displays normal reflexes. No cranial nerve deficit or sensory deficit. She exhibits normal muscle tone. Coordination normal.  Skin: Skin is warm, dry and intact. Capillary refill takes less than 2 seconds. No rash noted. She is not diaphoretic. No erythema. No pallor.  Psychiatric: She has a normal mood and affect. Her speech is normal and behavior is normal. Judgment and thought content normal. Cognition and memory are normal.  Nursing note and vitals reviewed.   Results for orders placed or performed in visit on 08/26/17  Thyroid Panel With TSH  Result Value Ref Range   TSH 0.655 0.450 - 4.500 uIU/mL   T4, Total 6.9 4.5 - 12.0 ug/dL   T3 Uptake Ratio 25 24 - 39 %   Free Thyroxine Index 1.7 1.2 - 4.9  Comprehensive metabolic panel  Result Value Ref Range   Glucose 85 65 - 99 mg/dL   BUN 9 6 - 20 mg/dL   Creatinine, Ser 0.63 0.57 - 1.00 mg/dL   GFR calc non Af Amer 117 >59 mL/min/1.73   GFR calc Af Amer 135 >59 mL/min/1.73   BUN/Creatinine Ratio 14 9 - 23   Sodium 139 134 - 144 mmol/L   Potassium 4.2 3.5 - 5.2 mmol/L   Chloride 99 96 - 106 mmol/L   CO2 23 20 - 29 mmol/L   Calcium 9.4 8.7 - 10.2 mg/dL   Total Protein  7.3 6.0 - 8.5 g/dL   Albumin 4.9 3.5 - 5.5 g/dL   Globulin, Total 2.4 1.5 -  4.5 g/dL   Albumin/Globulin Ratio 2.0 1.2 - 2.2   Bilirubin Total 0.2 0.0 - 1.2 mg/dL   Alkaline Phosphatase 56 39 - 117 IU/L   AST 15 0 - 40 IU/L   ALT 14 0 - 32 IU/L  Lipid Panel w/o Chol/HDL Ratio  Result Value Ref Range   Cholesterol, Total 180 100 - 199 mg/dL   Triglycerides 67 0 - 149 mg/dL   HDL 93 >39 mg/dL   VLDL Cholesterol Cal 13 5 - 40 mg/dL   LDL Calculated 74 0 - 99 mg/dL  CBC with Differential/Platelet  Result Value Ref Range   WBC 6.9 3.4 - 10.8 x10E3/uL   RBC 4.43 3.77 - 5.28 x10E6/uL   Hemoglobin 13.9 11.1 - 15.9 g/dL   Hematocrit 40.4 34.0 - 46.6 %   MCV 91 79 - 97 fL   MCH 31.4 26.6 - 33.0 pg   MCHC 34.4 31.5 - 35.7 g/dL   RDW 12.9 12.3 - 15.4 %   Platelets 304 150 - 379 x10E3/uL   Neutrophils 64 Not Estab. %   Lymphs 26 Not Estab. %   Monocytes 8 Not Estab. %   Eos 2 Not Estab. %   Basos 0 Not Estab. %   Neutrophils Absolute 4.4 1.4 - 7.0 x10E3/uL   Lymphocytes Absolute 1.8 0.7 - 3.1 x10E3/uL   Monocytes Absolute 0.6 0.1 - 0.9 x10E3/uL   EOS (ABSOLUTE) 0.1 0.0 - 0.4 x10E3/uL   Basophils Absolute 0.0 0.0 - 0.2 x10E3/uL   Immature Granulocytes 0 Not Estab. %   Immature Grans (Abs) 0.0 0.0 - 0.1 x10E3/uL      Assessment & Plan:   Problem List Items Addressed This Visit      Other   Low back pain    Stable. Continue current regimen. Continue to monitor. Call with any concerns. Rx for oxycodone today- Rx should last for 3-6 months. Will start some gabapentin. Call with any concerns.       Relevant Medications   acetaminophen (TYLENOL) 650 MG CR tablet   naproxen sodium (ALEVE) 220 MG tablet   oxyCODONE-acetaminophen (PERCOCET/ROXICET) 5-325 MG tablet   cyclobenzaprine (FLEXERIL) 10 MG tablet    Other Visit Diagnoses    Routine general medical examination at a health care facility    -  Primary   Vaccines up to date. Screening labs checked last visit. Pap done today.  Continue diet and exercise. Call with any concerns.    Screening for cervical cancer       Pap done today.   Relevant Orders   IGP, Aptima HPV, rfx 16/18,45       Follow up plan: Return in about 6 months (around 06/03/2018) for Follow up.  LABORATORY TESTING:  - Pap smear: pap done  IMMUNIZATIONS:   - Tdap: Tetanus vaccination status reviewed: last tetanus booster within 10 years. - Influenza: Up to date - Pneumovax: Not applicable  PATIENT COUNSELING:   Advised to take 1 mg of folate supplement per day if capable of pregnancy.   Sexuality: Discussed sexually transmitted diseases, partner selection, use of condoms, avoidance of unintended pregnancy  and contraceptive alternatives.   Advised to avoid cigarette smoking.  I discussed with the patient that most people either abstain from alcohol or drink within safe limits (<=14/week and <=4 drinks/occasion for males, <=7/weeks and <= 3 drinks/occasion for females) and that the risk for alcohol disorders and other health effects rises proportionally with the number of drinks per week and how often  a drinker exceeds daily limits.  Discussed cessation/primary prevention of drug use and availability of treatment for abuse.   Diet: Encouraged to adjust caloric intake to maintain  or achieve ideal body weight, to reduce intake of dietary saturated fat and total fat, to limit sodium intake by avoiding high sodium foods and not adding table salt, and to maintain adequate dietary potassium and calcium preferably from fresh fruits, vegetables, and low-fat dairy products.    stressed the importance of regular exercise  Injury prevention: Discussed safety belts, safety helmets, smoke detector, smoking near bedding or upholstery.   Dental health: Discussed importance of regular tooth brushing, flossing, and dental visits.    NEXT PREVENTATIVE PHYSICAL DUE IN 1 YEAR. Return in about 6 months (around 06/03/2018) for Follow  up.

## 2017-12-02 NOTE — Assessment & Plan Note (Signed)
Stable. Continue current regimen. Continue to monitor. Call with any concerns. Rx for oxycodone today- Rx should last for 3-6 months. Will start some gabapentin. Call with any concerns.  

## 2017-12-06 ENCOUNTER — Encounter: Payer: Self-pay | Admitting: Family Medicine

## 2017-12-06 LAB — IGP, APTIMA HPV, RFX 16/18,45
HPV APTIMA: NEGATIVE
PAP SMEAR COMMENT: 0

## 2018-03-08 ENCOUNTER — Telehealth: Payer: Self-pay | Admitting: Family Medicine

## 2018-03-08 NOTE — Telephone Encounter (Signed)
Was given a 6 month supply of the flexeril in April. Needs an appointment for the percocet.

## 2018-03-08 NOTE — Telephone Encounter (Unsigned)
Copied from [place] [id number]. Topic: Quick Communication - See Telephone Encounter >> Mar 08, 2018 11:28 AM Neva Seat wrote: cyclobenzaprine (FLEXERIL) 10 MG tablet oxyCODONE-acetaminophen (PERCOCET/ROXICET) 5-325 MG tablet  Pt is needing to know if she can have these can be filled.  Pt said she will come in for a visit if needed.  Just needing the medication since she sees Dr. Wynetta Emery on a reg basis.

## 2018-03-09 NOTE — Telephone Encounter (Signed)
Patient called and discussed. Will come in next week.

## 2018-03-16 ENCOUNTER — Ambulatory Visit: Payer: Managed Care, Other (non HMO) | Admitting: Family Medicine

## 2018-03-16 ENCOUNTER — Ambulatory Visit
Admission: RE | Admit: 2018-03-16 | Discharge: 2018-03-16 | Disposition: A | Discharge status: Home / Self Care | Payer: Managed Care, Other (non HMO) | Source: Ambulatory Visit | Attending: Family Medicine | Admitting: Family Medicine

## 2018-03-16 ENCOUNTER — Encounter: Payer: Self-pay | Admitting: Family Medicine

## 2018-03-16 VITALS — BP 119/66 | HR 84 | Temp 98.8°F | Wt 166.1 lb

## 2018-03-16 DIAGNOSIS — M25551 Pain in right hip: Secondary | ICD-10-CM | POA: Diagnosis present

## 2018-03-16 DIAGNOSIS — G8929 Other chronic pain: Secondary | ICD-10-CM

## 2018-03-16 DIAGNOSIS — M5441 Lumbago with sciatica, right side: Secondary | ICD-10-CM

## 2018-03-16 DIAGNOSIS — M419 Scoliosis, unspecified: Secondary | ICD-10-CM | POA: Diagnosis not present

## 2018-03-16 DIAGNOSIS — M48061 Spinal stenosis, lumbar region without neurogenic claudication: Secondary | ICD-10-CM | POA: Insufficient documentation

## 2018-03-16 MED ORDER — OXYCODONE-ACETAMINOPHEN 5-325 MG PO TABS: 1.0000 | ORAL_TABLET | Freq: Two times a day (BID) | ORAL | 0 refills | Status: DC | PRN

## 2018-03-16 NOTE — Assessment & Plan Note (Signed)
Stable. Continue current regimen. Continue to monitor. Call with any concerns. Rx for oxycodone today- Rx should last for 3-6 months. Will start some gabapentin. Call with any concerns.

## 2018-03-16 NOTE — Progress Notes (Signed)
BP 119/66 (BP Location: Left Arm, Patient Position: Sitting, Cuff Size: Normal)   Pulse 84   Temp 98.8 F (37.1 C)   Wt 166 lb 2 oz (75.4 kg)   SpO2 100%   BMI 28.78 kg/m    Subjective:    Patient ID: Theresa Roberts, female    DOB: 10/15/1980, 37 y.o.   MRN: [id number]  HPI: Theresa Roberts is a 37 y.o. female  Chief Complaint  Patient presents with  . Hip Pain    Right, patient would like x-rays, she also would like a refillon her pain medication, she states that she does have some left but does not want to run out.    HIP PAIN Duration: chronic- off and on for the last 3-4 years ago Involved hip: right  Mechanism of injury: unknown Location: lateral- deep inside Onset: gradual  Severity: 6/10  Quality: Dull, aching Frequency: constant Radiation: yes Aggravating factors:doing anything for a long period of time    Alleviating factors: stretching and medicine, popping   Status: stable Treatments attempted: rest, ice, heat, APAP, ibuprofen, aleve, physical therapy, HEP and chiropractor   Relief with NSAIDs?: mild Weakness with weight bearing: no Weakness with walking: no Paresthesias / decreased sensation: no Swelling: no Redness:no Fevers: no  CHRONIC PAIN  Present dose: 15 Morphine equivalents max- 60 pills last 3+ months Pain control status: controlled Duration: chronic Location: Low back pain Quality: dull and aching Current Pain Level: 6/10 Previous Pain Level: severe Breakthrough pain: yes Benefit from narcotic medications: yes What Activities task can be accomplished with current medication? Able to work and care for her children Interested in weaning off narcotics:no   Stool softners/OTC fiber: no  Previous pain specialty evaluation: no Non-narcotic analgesic meds: yes Narcotic contract: yes  Relevant past medical, surgical, family and social history reviewed and updated as indicated. Interim medical history since our last visit  reviewed. Allergies and medications reviewed and updated.  Review of Systems  Constitutional: Negative.   Respiratory: Negative.   Cardiovascular: Negative.   Musculoskeletal: Positive for arthralgias, back pain and myalgias. Negative for gait problem, joint swelling, neck pain and neck stiffness.  Skin: Negative.   Neurological: Negative.   Psychiatric/Behavioral: Negative.     Per HPI unless specifically indicated above     Objective:    BP 119/66 (BP Location: Left Arm, Patient Position: Sitting, Cuff Size: Normal)   Pulse 84   Temp 98.8 F (37.1 C)   Wt 166 lb 2 oz (75.4 kg)   SpO2 100%   BMI 28.78 kg/m   Wt Readings from Last 3 Encounters:  03/16/18 166 lb 2 oz (75.4 kg)  12/02/17 163 lb 5 oz (74.1 kg)  08/26/17 165 lb 6.4 oz (75 kg)    Physical Exam  Constitutional: She is oriented to person, place, and time. She appears well-developed and well-nourished. No distress.  HENT:  Head: Normocephalic and atraumatic.  Right Ear: Hearing normal.  Left Ear: Hearing normal.  Nose: Nose normal.  Eyes: Conjunctivae and lids are normal. Right eye exhibits no discharge. Left eye exhibits no discharge. No scleral icterus.  Cardiovascular: Normal rate, regular rhythm, normal heart sounds and intact distal pulses. Exam reveals no gallop and no friction rub.  No murmur heard. Pulmonary/Chest: Effort normal and breath sounds normal. No stridor. No respiratory distress. She has no wheezes. She has no rales. She exhibits no tenderness.  Musculoskeletal: Normal range of motion.  Neurological: She is alert and oriented to person,  place, and time.  Skin: Skin is warm, dry and intact. Capillary refill takes less than 2 seconds. No rash noted. She is not diaphoretic. No erythema. No pallor.  Psychiatric: She has a normal mood and affect. Her speech is normal and behavior is normal. Judgment and thought content normal. Cognition and memory are normal.  Nursing note and vitals  reviewed.   Results for orders placed or performed in visit on 12/02/17  IGP, Aptima HPV, rfx 16/18,45  Result Value Ref Range   DIAGNOSIS: Comment    Specimen adequacy: Comment    Clinician Provided ICD10 Comment    Performed by: Comment    PAP Smear Comment .    Note: Comment    Test Methodology Comment    HPV Aptima Negative Negative      Assessment & Plan:   Problem List Items Addressed This Visit      Other   Low back pain    Stable. Continue current regimen. Continue to monitor. Call with any concerns. Rx for oxycodone today- Rx should last for 3-6 months. Will start some gabapentin. Call with any concerns.       Relevant Medications   oxyCODONE-acetaminophen (PERCOCET/ROXICET) 5-325 MG tablet   Other Relevant Orders   DG HIP UNILAT WITH PELVIS 2-3 VIEWS RIGHT   DG Lumbar Spine Complete    Other Visit Diagnoses    Right hip pain    -  Primary   Will get hip x-ray. Order placed. Checking lumbar x-ray as well. Await results and treat as needed.    Relevant Orders   DG HIP UNILAT WITH PELVIS 2-3 VIEWS RIGHT       Follow up plan: Return in about 6 weeks (around 04/27/2018) for follow up pain.

## 2018-03-17 ENCOUNTER — Telehealth: Payer: Self-pay | Admitting: Family Medicine

## 2018-03-17 NOTE — Telephone Encounter (Signed)
Please her know that her hip x-rays were normal, but her back x-ray still shows arthritis. If she'd like to see the back doctor, I can get her back in there, or we can watch it a bit.

## 2018-03-17 NOTE — Telephone Encounter (Signed)
Patient notified, she will just watch it and see how it does.

## 2018-03-17 NOTE — Telephone Encounter (Signed)
Called patient, no answer, unable to leave a message. Will try again.  

## 2018-03-17 NOTE — Telephone Encounter (Signed)
Patient called back. Information relayed

## 2018-06-05 ENCOUNTER — Ambulatory Visit: Payer: Managed Care, Other (non HMO) | Admitting: Family Medicine

## 2018-06-05 ENCOUNTER — Encounter: Payer: Self-pay | Admitting: Family Medicine

## 2018-06-05 VITALS — BP 117/79 | HR 85 | Temp 99.4°F | Ht 63.5 in | Wt 166.0 lb

## 2018-06-05 DIAGNOSIS — M5441 Lumbago with sciatica, right side: Secondary | ICD-10-CM | POA: Diagnosis not present

## 2018-06-05 DIAGNOSIS — G8929 Other chronic pain: Secondary | ICD-10-CM

## 2018-06-05 MED ORDER — CYCLOBENZAPRINE HCL 10 MG PO TABS: 10.0000 mg | ORAL_TABLET | Freq: Every day | ORAL | 1 refills | Status: DC

## 2018-06-05 MED ORDER — GABAPENTIN 100 MG PO CAPS: 100.0000 mg | ORAL_CAPSULE | Freq: Every day | ORAL | 3 refills | Status: DC

## 2018-06-05 MED ORDER — OXYCODONE-ACETAMINOPHEN 5-325 MG PO TABS: 1.0000 | ORAL_TABLET | Freq: Two times a day (BID) | ORAL | 0 refills | Status: DC | PRN

## 2018-06-05 NOTE — Progress Notes (Signed)
BP 117/79   Pulse 85   Temp 99.4 F (37.4 C) (Oral)   Ht 5' 3.5" (1.613 m)   Wt 166 lb (75.3 kg)   SpO2 96%   BMI 28.94 kg/m    Subjective:    Patient ID: Theresa Roberts, female    DOB: [birth year], 37 y.o.   MRN: [id number]  HPI: Theresa Roberts is a 37 y.o. female  Chief Complaint  Patient presents with  . Back Pain   BACK PAIN- doing about the same. She notes that she bought a contraption to help with her back. It is kind of a like an inversion table that she can do with her exercise. She notes that it is not any worse. She is still feeling about the same. Worse with cold weather and better with warmer weather. Taking aleve and tylenol. That has been helping but not as much. Has been taking the 1/2 or 1/4 of the flexeril mainly to help her sleep. She got a new bed which is more firm. She notes that her back is still really botheringher and she is not feeling any better, but not doing any worse. She has seen several doctors and done PT. She saw a spine specialist, saw PT, saw sports med, had MRI- pain is worse with the rain and with sitting, better with movement. Hip is doing better. Has been doing her stretches.  Duration:chronic Location: midline and low back Onset: gradual Severity: severe Quality: sharp, aching Frequency: worse with sitting Radiation: buttocks Aggravating factor: prolonged sitting Alleviating factors: rest, ice, heat, laying, NSAIDs, narcotics and muscle relaxer Status: stable Treatments attempted: rest, ice, heat, APAP, ibuprofen, aleve, physical therapy and HEP Relief with NSAIDs?: mild Nighttime pain:  no Paresthesias / decreased sensation:  no Bowel / bladder incontinence:  no Fevers:  no Dysuria / urinary frequency:  no  Relevant past medical, surgical, family and social history reviewed and updated as indicated. Interim medical history since our last visit reviewed. Allergies and medications reviewed and updated.  Review of Systems    Constitutional: Negative.   Respiratory: Negative.   Cardiovascular: Negative.   Musculoskeletal: Positive for back pain and myalgias. Negative for arthralgias, gait problem, joint swelling, neck pain and neck stiffness.  Skin: Negative.   Neurological: Negative.   Psychiatric/Behavioral: Negative.     Per HPI unless specifically indicated above     Objective:    BP 117/79   Pulse 85   Temp 99.4 F (37.4 C) (Oral)   Ht 5' 3.5" (1.613 m)   Wt 166 lb (75.3 kg)   SpO2 96%   BMI 28.94 kg/m   Wt Readings from Last 3 Encounters:  06/05/18 166 lb (75.3 kg)  03/16/18 166 lb 2 oz (75.4 kg)  12/02/17 163 lb 5 oz (74.1 kg)    Physical Exam  Constitutional: She is oriented to person, place, and time. She appears well-developed and well-nourished. No distress.  HENT:  Head: Normocephalic and atraumatic.  Right Ear: Hearing normal.  Left Ear: Hearing normal.  Nose: Nose normal.  Eyes: Conjunctivae and lids are normal. Right eye exhibits no discharge. Left eye exhibits no discharge. No scleral icterus.  Cardiovascular: Normal rate, regular rhythm, normal heart sounds and intact distal pulses. Exam reveals no gallop and no friction rub.  No murmur heard. Pulmonary/Chest: Effort normal and breath sounds normal. No stridor. No respiratory distress. She has no wheezes. She has no rales. She exhibits no tenderness.  Musculoskeletal: Normal range of motion.  Neurological: She is alert and oriented to person, place, and time.  Skin: Skin is warm, dry and intact. Capillary refill takes less than 2 seconds. No rash noted. She is not diaphoretic. No erythema. No pallor.  Psychiatric: She has a normal mood and affect. Her speech is normal and behavior is normal. Judgment and thought content normal. Cognition and memory are normal.  Nursing note and vitals reviewed.   Results for orders placed or performed in visit on 12/02/17  IGP, Aptima HPV, rfx 16/18,45  Result Value Ref Range    DIAGNOSIS: Comment    Specimen adequacy: Comment    Clinician Provided ICD10 Comment    Performed by: Comment    PAP Smear Comment .    Note: Comment    Test Methodology Comment    HPV Aptima Negative Negative      Assessment & Plan:   Problem List Items Addressed This Visit      Other   Low back pain - Primary    Stable. Continue current regimen. Continue to monitor. Call with any concerns. Rx for oxycodone today- Rx should last for 3-6 months. Will continue gabapentin and flexeril. Call with any concerns.       Relevant Medications   cyclobenzaprine (FLEXERIL) 10 MG tablet   oxyCODONE-acetaminophen (PERCOCET/ROXICET) 5-325 MG tablet       Follow up plan: Return in about 6 months (around 12/05/2018) for Physical.

## 2018-06-05 NOTE — Assessment & Plan Note (Signed)
Stable. Continue current regimen. Continue to monitor. Call with any concerns. Rx for oxycodone today- Rx should last for 3-6 months. Will continue gabapentin and flexeril. Call with any concerns.  

## 2018-06-06 ENCOUNTER — Telehealth: Payer: Self-pay

## 2018-06-06 NOTE — Telephone Encounter (Signed)
PA started for oxycodone/ acetaminophen 5/325mg .  Awaiting clinical review.

## 2018-06-27 ENCOUNTER — Telehealth: Payer: Self-pay | Admitting: Obstetrics and Gynecology

## 2018-06-27 NOTE — Telephone Encounter (Signed)
Per Epic apt is 07/27/18. Noted. Will order to arrive by apt date/time.

## 2018-06-27 NOTE — Telephone Encounter (Signed)
Patient scheduled for nexplanon replacement with ABC on 11/19

## 2018-07-21 NOTE — Telephone Encounter (Signed)
Nexplanon reserved for this patient.

## 2018-07-27 ENCOUNTER — Ambulatory Visit (INDEPENDENT_AMBULATORY_CARE_PROVIDER_SITE_OTHER): Payer: Managed Care, Other (non HMO) | Admitting: Obstetrics and Gynecology

## 2018-07-27 ENCOUNTER — Ambulatory Visit: Payer: 59 | Admitting: Obstetrics and Gynecology

## 2018-07-27 ENCOUNTER — Encounter: Payer: Self-pay | Admitting: Obstetrics and Gynecology

## 2018-07-27 VITALS — BP 96/60 | Ht 65.0 in | Wt 166.0 lb

## 2018-07-27 DIAGNOSIS — Z3046 Encounter for surveillance of implantable subdermal contraceptive: Secondary | ICD-10-CM | POA: Diagnosis not present

## 2018-07-27 DIAGNOSIS — Z30017 Encounter for initial prescription of implantable subdermal contraceptive: Secondary | ICD-10-CM

## 2018-07-27 MED ORDER — ETONOGESTREL 68 MG ~~LOC~~ IMPL
68.0000 mg | DRUG_IMPLANT | Freq: Once | SUBCUTANEOUS | Status: DC
Start: 2018-07-27 — End: 2021-12-24

## 2018-07-27 NOTE — Patient Instructions (Signed)
I value your feedback and entrusting us with your care. If you get a [facility] patient survey, I would appreciate you taking the time to let us know about your experience today. Thank you!  Remove the dressing in 24 hours,  keep the incision area dry for 24 hours and remove the Steristrip in 2-3  days.  Notify us if any signs of tenderness, redness, pain, or fevers develop.  

## 2018-07-27 NOTE — Progress Notes (Signed)
    Chief Complaint  Patient presents with  . Contraception    Nexplanon removal/reinsertion  . [employer] Employee     History of Present Illness:  Theresa Roberts is a 37 y.o. that had a nexplanon placed approximately 3 years  ago. Since that time, she has done well. Has monthly light bleeding. Headaches and back dysmen significantly improved with nexplanon. Wants another one. She is sex active with female partner.  Paps with PCP  Nexplanon removal Procedure note - The Nexplanon was noted in the patient's arm and the end was identified. The skin was cleansed with a Betadine solution. A small injection of subcutaneous lidocaine with epinephrine was given over the end of the implant. An incision was made at the end of the implant. The rod was noted in the incision and grasped with a hemostat. It was noted to be intact.  Steri-Strip was placed approximating the incision. Hemostasis was noted.  BP 96/60   Ht 5\' 5"  (1.651 m)   Wt 166 lb (75.3 kg)   BMI 27.62 kg/m    Nexplanon Insertion  Patient given informed consent, signed copy in the chart, time out was performed.  Appropriate time out taken.  Patient's LEFT arm was prepped and draped in the usual sterile fashion. The ruler used to measure and mark insertion area.  Pt was prepped with betadine swab and then injected with 1.0 cc of 2% lidocaine with epinephrine. Nexplanon removed form packaging,  Device confirmed in needle, then inserted full length of needle and withdrawn per handbook instructions.  Pt insertion site covered with steri-strip and a bandage.   Minimal blood loss.  Pt tolerated the procedure welL.  Assessment: Nexplanon removal  Nexplanon insertion - Plan: etonogestrel (NEXPLANON) implant 68 mg   Meds ordered this encounter  Medications  . etonogestrel (NEXPLANON) implant 68 mg     Plan:   She was told to remove the dressing in 12-24 hours, to keep the incision area dry for 24 hours and to remove the Steristrip  in 2-3  days.  Notify us if any signs of tenderness, redness, pain, or fevers develop.   Alicia B. Copland, PA-C 07/27/2018 11:35 AM

## 2018-10-19 ENCOUNTER — Other Ambulatory Visit: Payer: Self-pay

## 2018-10-19 ENCOUNTER — Encounter: Payer: Self-pay | Admitting: Family Medicine

## 2018-10-19 ENCOUNTER — Ambulatory Visit: Payer: Managed Care, Other (non HMO) | Admitting: Family Medicine

## 2018-10-19 VITALS — BP 101/68 | HR 87 | Temp 98.4°F | Ht 65.0 in | Wt 166.0 lb

## 2018-10-19 DIAGNOSIS — M5441 Lumbago with sciatica, right side: Secondary | ICD-10-CM | POA: Diagnosis not present

## 2018-10-19 DIAGNOSIS — G8929 Other chronic pain: Secondary | ICD-10-CM

## 2018-10-19 MED ORDER — ACYCLOVIR 800 MG PO TABS: 800.0000 mg | ORAL_TABLET | Freq: Two times a day (BID) | ORAL | 12 refills | Status: DC

## 2018-10-19 MED ORDER — OXYCODONE-ACETAMINOPHEN 5-325 MG PO TABS: 1.0000 | ORAL_TABLET | Freq: Two times a day (BID) | ORAL | 0 refills | Status: DC | PRN

## 2018-10-19 MED ORDER — CYCLOBENZAPRINE HCL 10 MG PO TABS: 10.0000 mg | ORAL_TABLET | Freq: Every day | ORAL | 1 refills | Status: DC

## 2018-10-19 MED ORDER — GABAPENTIN 100 MG PO CAPS: 100.0000 mg | ORAL_CAPSULE | Freq: Every day | ORAL | 1 refills | Status: DC

## 2018-10-19 NOTE — Assessment & Plan Note (Signed)
Stable. Continue current regimen. Continue to monitor. Call with any concerns. Rx for oxycodone today- Rx should last for 3-6 months. Will continue gabapentin and flexeril. Call with any concerns.

## 2018-10-19 NOTE — Progress Notes (Signed)
BP 101/68   Pulse 87   Temp 98.4 F (36.9 C) (Oral)   Ht 5\' 5"  (1.651 m)   Wt 166 lb (75.3 kg)   SpO2 98%   BMI 27.62 kg/m    Subjective:    Patient ID: Theresa Roberts, female    DOB: 10-22-1980, 38 y.o.   MRN: [id number]  HPI: Theresa Roberts is a 38 y.o. female  Chief Complaint  Patient presents with  . Back Pain    pt requests acyclovir and percocet refill   CHRONIC PAIN- has been doing a bit better. Has been going to the gym. Feeling a bit better and feeling like she's getting stronger. She notes that she's been doing this for about 3 weeks.  Feeling like it's not catching as much- doing a bit better.  Present dose: 15 Morphine equivalents Pain control status: stable Duration: chronic Location: midline and low back Quality: sharp and aching Current Pain Level: moderate Previous Pain Level: severe Breakthrough pain: yes Benefit from narcotic medications: yes What Activities task can be accomplished with current medication? Able to work and take care of her family  Interested in weaning off narcotics:no   Stool softners/OTC fiber: yes  Previous pain specialty evaluation: no Non-narcotic analgesic meds: yes Narcotic contract: yes   Relevant past medical, surgical, family and social history reviewed and updated as indicated. Interim medical history since our last visit reviewed. Allergies and medications reviewed and updated.  Review of Systems  Constitutional: Negative.   Respiratory: Negative.   Cardiovascular: Negative.   Gastrointestinal: Negative.   Musculoskeletal: Positive for back pain and myalgias. Negative for arthralgias, gait problem, joint swelling, neck pain and neck stiffness.  Skin: Negative.   Neurological: Negative.   Psychiatric/Behavioral: Negative.     Per HPI unless specifically indicated above     Objective:    BP 101/68   Pulse 87   Temp 98.4 F (36.9 C) (Oral)   Ht 5\' 5"  (1.651 m)   Wt 166 lb (75.3 kg)   SpO2 98%   BMI  27.62 kg/m   Wt Readings from Last 3 Encounters:  10/19/18 166 lb (75.3 kg)  07/27/18 166 lb (75.3 kg)  06/05/18 166 lb (75.3 kg)    Physical Exam Vitals signs and nursing note reviewed.  Constitutional:      General: She is not in acute distress.    Appearance: Normal appearance. She is not ill-appearing, toxic-appearing or diaphoretic.  HENT:     Head: Normocephalic and atraumatic.     Right Ear: External ear normal.     Left Ear: External ear normal.     Nose: Nose normal.     Mouth/Throat:     Mouth: Mucous membranes are moist.     Pharynx: Oropharynx is clear.  Eyes:     General: No scleral icterus.       Right eye: No discharge.        Left eye: No discharge.     Extraocular Movements: Extraocular movements intact.     Conjunctiva/sclera: Conjunctivae normal.     Pupils: Pupils are equal, round, and reactive to light.  Neck:     Musculoskeletal: Normal range of motion and neck supple.  Cardiovascular:     Rate and Rhythm: Normal rate and regular rhythm.     Pulses: Normal pulses.     Heart sounds: Normal heart sounds. No murmur. No friction rub. No gallop.   Pulmonary:     Effort: Pulmonary effort is  normal. No respiratory distress.     Breath sounds: Normal breath sounds. No stridor. No wheezing, rhonchi or rales.  Chest:     Chest wall: No tenderness.  Musculoskeletal: Normal range of motion.  Skin:    General: Skin is warm and dry.     Capillary Refill: Capillary refill takes less than 2 seconds.     Coloration: Skin is not jaundiced or pale.     Findings: No bruising, erythema, lesion or rash.  Neurological:     General: No focal deficit present.     Mental Status: She is alert and oriented to person, place, and time. Mental status is at baseline.  Psychiatric:        Mood and Affect: Mood normal.        Behavior: Behavior normal.        Thought Content: Thought content normal.        Judgment: Judgment normal.     Results for orders placed or  performed in visit on 12/02/17  IGP, Aptima HPV, rfx 16/18,45  Result Value Ref Range   DIAGNOSIS: Comment    Specimen adequacy: Comment    Clinician Provided ICD10 Comment    Performed by: Comment    PAP Smear Comment .    Note: Comment    Test Methodology Comment    HPV Aptima Negative Negative      Assessment & Plan:   Problem List Items Addressed This Visit      Other   Low back pain - Primary    Stable. Continue current regimen. Continue to monitor. Call with any concerns. Rx for oxycodone today- Rx should last for 3-6 months. Will continue gabapentin and flexeril. Call with any concerns.       Relevant Medications   cyclobenzaprine (FLEXERIL) 10 MG tablet   oxyCODONE-acetaminophen (PERCOCET/ROXICET) 5-325 MG tablet       Follow up plan: Return in about 3 months (around 01/19/2019) for Physical.

## 2018-10-20 ENCOUNTER — Telehealth: Payer: Self-pay

## 2018-10-20 NOTE — Telephone Encounter (Signed)
PA submitted via Cover My Meds for Oxycodone-acetaminophen 5-325. Awaiting approval or denial.

## 2018-10-23 NOTE — Telephone Encounter (Signed)
Prior Authorization was approved

## 2018-12-05 ENCOUNTER — Encounter: Payer: Managed Care, Other (non HMO) | Admitting: Family Medicine

## 2019-02-02 ENCOUNTER — Encounter: Payer: Self-pay | Admitting: Family Medicine

## 2019-02-02 ENCOUNTER — Ambulatory Visit (INDEPENDENT_AMBULATORY_CARE_PROVIDER_SITE_OTHER): Payer: Managed Care, Other (non HMO) | Admitting: Family Medicine

## 2019-02-02 ENCOUNTER — Other Ambulatory Visit: Payer: Self-pay

## 2019-02-02 VITALS — Temp 97.4°F | Ht 65.0 in

## 2019-02-02 DIAGNOSIS — G8929 Other chronic pain: Secondary | ICD-10-CM | POA: Diagnosis not present

## 2019-02-02 DIAGNOSIS — M5441 Lumbago with sciatica, right side: Secondary | ICD-10-CM | POA: Diagnosis not present

## 2019-02-02 DIAGNOSIS — L237 Allergic contact dermatitis due to plants, except food: Secondary | ICD-10-CM | POA: Diagnosis not present

## 2019-02-02 MED ORDER — PREDNISONE 50 MG PO TABS: 50.0000 mg | ORAL_TABLET | Freq: Every day | ORAL | 0 refills | Status: DC

## 2019-02-02 MED ORDER — OXYCODONE-ACETAMINOPHEN 5-325 MG PO TABS: 1.0000 | ORAL_TABLET | Freq: Two times a day (BID) | ORAL | 0 refills | Status: DC | PRN

## 2019-02-02 NOTE — Progress Notes (Signed)
Temp (!) 97.4 F (36.3 C) (Oral)   Ht 5\' 5"  (1.651 m)   BMI 27.62 kg/m    Subjective:    Patient ID: Theresa Roberts, female    DOB: 1981-07-24, 38 y.o.   MRN: [id number]  HPI: Theresa Roberts is a 38 y.o. female  Chief Complaint  Patient presents with  . Back Pain    med refill  . Medication Refill    oxycodone   Theresa Roberts presents today via virtual visit because her partner had an exposure at work for [employer] and is awaiting testing.   CHRONIC PAIN- doing a bit worse with the quarantine and sitting at the desk at home, better now, but was bad for the first few weeks Present dose: 15 Morphine equivalents Pain control status: stable Duration: chronic Location: midline and low back Quality: sharp and aching Current Pain Level: moderate Previous Pain Level: moderate Breakthrough pain: yes Benefit from narcotic medications: yes What Activities task can be accomplished with current medication?: able to work and take care of her family Interested in weaning off narcotics:no   Stool softners/OTC fiber: yes  Previous pain specialty evaluation: no Non-narcotic analgesic meds: yes Narcotic contract: yes  RASH- got into bushes yesterday, started with a rash last night Duration:  2 days  Location: generalized  Itching: yes Burning: yes Redness: yes Oozing: yes Scaling: no Blisters: no Painful: no Fevers: no Change in detergents/soaps/personal care products: no Recent illness: no Recent travel:no History of same: no Context: worse Alleviating factors: nothing Treatments attempted:lotion/moisturizer and nothing Shortness of breath: no  Throat/tongue swelling: no Myalgias/arthralgias: no  Relevant past medical, surgical, family and social history reviewed and updated as indicated. Interim medical history since our last visit reviewed. Allergies and medications reviewed and updated.  Review of Systems  Constitutional: Negative.   Respiratory: Negative.    Cardiovascular: Negative.   Gastrointestinal: Negative.   Musculoskeletal: Positive for arthralgias, back pain and myalgias. Negative for gait problem, joint swelling, neck pain and neck stiffness.  Skin: Positive for rash. Negative for color change, pallor and wound.  Neurological: Negative.   Psychiatric/Behavioral: Negative.     Per HPI unless specifically indicated above     Objective:    Temp (!) 97.4 F (36.3 C) (Oral)   Ht 5\' 5"  (1.651 m)   BMI 27.62 kg/m   Wt Readings from Last 3 Encounters:  10/19/18 166 lb (75.3 kg)  07/27/18 166 lb (75.3 kg)  06/05/18 166 lb (75.3 kg)    Physical Exam Vitals signs and nursing note reviewed.  Constitutional:      General: She is not in acute distress.    Appearance: Normal appearance. She is not ill-appearing, toxic-appearing or diaphoretic.  HENT:     Head: Normocephalic and atraumatic.     Right Ear: External ear normal.     Left Ear: External ear normal.     Nose: Nose normal.     Mouth/Throat:     Mouth: Mucous membranes are moist.     Pharynx: Oropharynx is clear.  Eyes:     General: No scleral icterus.       Right eye: No discharge.        Left eye: No discharge.     Conjunctiva/sclera: Conjunctivae normal.     Pupils: Pupils are equal, round, and reactive to light.  Neck:     Musculoskeletal: Normal range of motion.  Pulmonary:     Effort: Pulmonary effort is normal. No respiratory distress.  Comments: Speaking in full sentences Musculoskeletal: Normal range of motion.  Skin:    Coloration: Skin is not jaundiced or pale.     Findings: Rash (papular erythematous rash on bilateral arms) present. No bruising, erythema or lesion.  Neurological:     Mental Status: She is alert and oriented to person, place, and time. Mental status is at baseline.  Psychiatric:        Mood and Affect: Mood normal.        Behavior: Behavior normal.        Thought Content: Thought content normal.        Judgment: Judgment normal.      Results for orders placed or performed in visit on 12/02/17  IGP, Aptima HPV, rfx 16/18,45  Result Value Ref Range   DIAGNOSIS: Comment    Specimen adequacy: Comment    Clinician Provided ICD10 Comment    Performed by: Comment    PAP Smear Comment .    Note: Comment    Test Methodology Comment    HPV Aptima Negative Negative      Assessment & Plan:   Problem List Items Addressed This Visit      Other   Low back pain - Primary    Under good control on current regimen. Continue current regimen. Continue to monitor. Call with any concerns. Refills given for 3 months. Follow up in 3 months.        Relevant Medications   oxyCODONE-acetaminophen (PERCOCET/ROXICET) 5-325 MG tablet   predniSONE (DELTASONE) 50 MG tablet    Other Visit Diagnoses    Allergic contact dermatitis due to plants, except food       Will treat with prednisone. Call with any concerns. Continue to monitor.        Follow up plan: Return in about 3 months (around 05/05/2019) for Physical.   . This visit was completed via [employer] due to the restrictions of the COVID-19 pandemic. All issues as above were discussed and addressed. Physical exam was done as above through visual confirmation on [employer]. If it was felt that the patient should be evaluated in the office, they were directed there. The patient verbally consented to this visit. . Location of the patient: home . Location of the provider: work . Those involved with this call:  . Provider: Park Liter, DO . CMA: Gerda Diss, CMA . Front Desk/Registration: Don Perking  . Time spent on call: 25 minutes with patient face to face via video conference. More than 50% of this time was spent in counseling and coordination of care. 40 minutes total spent in review of patient's record and preparation of their chart.

## 2019-02-02 NOTE — Assessment & Plan Note (Signed)
Under good control on current regimen. Continue current regimen. Continue to monitor. Call with any concerns. Refills given for 3 months. Follow up in 3 months.   

## 2019-02-03 ENCOUNTER — Encounter: Payer: Self-pay | Admitting: Family Medicine

## 2019-02-05 ENCOUNTER — Telehealth: Payer: Self-pay | Admitting: Family Medicine

## 2019-02-05 MED ORDER — PREDNISONE 50 MG PO TABS: 50.0000 mg | ORAL_TABLET | Freq: Every day | ORAL | 0 refills | Status: DC

## 2019-02-05 NOTE — Telephone Encounter (Signed)
Another round of prednisone sent in. If not getting better, let me know

## 2019-02-05 NOTE — Telephone Encounter (Signed)
Patient notified and verbalized understanding. 

## 2019-02-05 NOTE — Telephone Encounter (Signed)
Patient says poison oak is spreading and getting worse. Would like a call back to discuss what to do next now that prednisone is done.

## 2019-02-13 NOTE — Telephone Encounter (Signed)
Patient is calling back stating the poison oak is still not cleaned up, and has been getting the rash other places as well.  This is patient second round prednisone.  Patient is wondering what the next step should be.  Call back # [phone]

## 2019-02-18 NOTE — Telephone Encounter (Signed)
Please schedule appointment for patient thanks!

## 2019-02-19 NOTE — Telephone Encounter (Signed)
Appt scheduled for Friday in hopes she will be better and can cancel.

## 2019-02-23 ENCOUNTER — Ambulatory Visit: Payer: Managed Care, Other (non HMO) | Admitting: Family Medicine

## 2019-05-18 ENCOUNTER — Ambulatory Visit (INDEPENDENT_AMBULATORY_CARE_PROVIDER_SITE_OTHER): Payer: Managed Care, Other (non HMO) | Admitting: Family Medicine

## 2019-05-18 ENCOUNTER — Other Ambulatory Visit: Payer: Self-pay

## 2019-05-18 ENCOUNTER — Encounter: Payer: Self-pay | Admitting: Family Medicine

## 2019-05-18 VITALS — BP 102/68 | HR 87 | Temp 98.6°F | Ht 63.5 in | Wt 164.0 lb

## 2019-05-18 DIAGNOSIS — Z0000 Encounter for general adult medical examination without abnormal findings: Secondary | ICD-10-CM | POA: Diagnosis not present

## 2019-05-18 DIAGNOSIS — K219 Gastro-esophageal reflux disease without esophagitis: Secondary | ICD-10-CM | POA: Diagnosis not present

## 2019-05-18 DIAGNOSIS — M5441 Lumbago with sciatica, right side: Secondary | ICD-10-CM | POA: Diagnosis not present

## 2019-05-18 DIAGNOSIS — Z13220 Encounter for screening for lipoid disorders: Secondary | ICD-10-CM

## 2019-05-18 DIAGNOSIS — F430 Acute stress reaction: Secondary | ICD-10-CM

## 2019-05-18 DIAGNOSIS — G8929 Other chronic pain: Secondary | ICD-10-CM

## 2019-05-18 LAB — UA/M W/RFLX CULTURE, ROUTINE
Bilirubin, UA: NEGATIVE
Glucose, UA: NEGATIVE
Ketones, UA: NEGATIVE
Leukocytes,UA: NEGATIVE
Nitrite, UA: NEGATIVE
Protein,UA: NEGATIVE
RBC, UA: NEGATIVE
Specific Gravity, UA: 1.015 (ref 1.005–1.030)
Urobilinogen, Ur: 0.2 mg/dL (ref 0.2–1.0)
pH, UA: 7 (ref 5.0–7.5)

## 2019-05-18 LAB — BAYER DCA HB A1C WAIVED: HB A1C (BAYER DCA - WAIVED): 4.9 % (ref <7.0)

## 2019-05-18 MED ORDER — CYCLOBENZAPRINE HCL 10 MG PO TABS: 10.0000 mg | ORAL_TABLET | Freq: Every day | ORAL | 1 refills | Status: DC

## 2019-05-18 MED ORDER — OMEPRAZOLE 20 MG PO CPDR: 20.0000 mg | DELAYED_RELEASE_CAPSULE | Freq: Every day | ORAL | 0 refills | Status: DC

## 2019-05-18 MED ORDER — BUSPIRONE HCL 7.5 MG PO TABS: 7.5000 mg | ORAL_TABLET | Freq: Three times a day (TID) | ORAL | 2 refills | Status: DC | PRN

## 2019-05-18 MED ORDER — GABAPENTIN 100 MG PO CAPS: 100.0000 mg | ORAL_CAPSULE | Freq: Every day | ORAL | 1 refills | Status: DC

## 2019-05-18 MED ORDER — OXYCODONE-ACETAMINOPHEN 5-325 MG PO TABS: 1.0000 | ORAL_TABLET | Freq: Two times a day (BID) | ORAL | 0 refills | Status: DC | PRN

## 2019-05-18 NOTE — Progress Notes (Signed)
BP 102/68   Pulse 87   Temp 98.6 F (37 C) (Oral)   Ht 5' 3.5" (1.613 m)   Wt 164 lb (74.4 kg)   SpO2 100%   BMI 28.60 kg/m    Subjective:    Patient ID: Theresa Roberts, female    DOB: Sep 02, 1980, 38 y.o.   MRN: [id number]  HPI: Theresa Roberts is a 38 y.o. female presenting on 05/18/2019 for comprehensive medical examination. Current medical complaints include:  Stress has been really high with her children doing virtual schooling- has been chewing her nails. Has been doing a boot camp and that seems like it has helped.   CHRONIC PAIN- started doing a boot camp and has been having some increased stiffness from that.   Present dose: 15 Morphine equivalents Pain control status: stable Duration: chronic Location: low back Quality: aching and sore Current Pain Level: moderate Previous Pain Level: moderate Breakthrough pain: yes Benefit from narcotic medications: yes What Activities task can be accomplished with current medication? Able to work and take care of her family Interested in weaning off narcotics:no   Stool softners/OTC fiber: yes  Previous pain specialty evaluation: no Non-narcotic analgesic meds: yes Narcotic contract: yes  She currently lives with: partner and kids Menopausal Symptoms: no  Depression Screen done today and results listed below:  Depression screen [facility] 2/9 05/18/2019 12/02/2017 08/13/2016  Decreased Interest 0 0 0  Down, Depressed, Hopeless 0 0 0  PHQ - 2 Score 0 0 0  Altered sleeping 1 2 -  Tired, decreased energy 0 2 -  Change in appetite 0 0 -  Feeling bad or failure about yourself  0 0 -  Trouble concentrating 0 1 -  Moving slowly or fidgety/restless 0 2 -  Suicidal thoughts 0 0 -  PHQ-9 Score 1 7 -  Difficult doing work/chores Not difficult at all - -    Past Medical History:  Past Medical History:  Diagnosis Date  . Scoliosis     Surgical History:  Past Surgical History:  Procedure Laterality Date  . CHOLECYSTECTOMY    .  PLACEMENT OF BREAST IMPLANTS      Medications:  Current Outpatient Medications on File Prior to Visit  Medication Sig  . acetaminophen (TYLENOL) 650 MG CR tablet Take 650 mg by mouth every 8 (eight) hours as needed for pain.  Marland Kitchen acyclovir (ZOVIRAX) 800 MG tablet Take 1 tablet (800 mg total) by mouth 2 (two) times daily.   Current Facility-Administered Medications on File Prior to Visit  Medication  . etonogestrel (NEXPLANON) implant 68 mg    Allergies:  No Known Allergies  Social History:  Social History   Socioeconomic History  . Marital status: Single    Spouse name: Not on file  . Number of children: Not on file  . Years of education: Not on file  . Highest education level: Not on file  Occupational History  . Not on file  Social Needs  . Financial resource strain: Not on file  . Food insecurity    Worry: Not on file    Inability: Not on file  . Transportation needs    Medical: Not on file    Non-medical: Not on file  Tobacco Use  . Smoking status: Never Smoker  . Smokeless tobacco: Never Used  Substance and Sexual Activity  . Alcohol use: No    Alcohol/week: 0.0 standard drinks  . Drug use: No  . Sexual activity: Yes    Birth  control/protection: Implant  Lifestyle  . Physical activity    Days per week: Not on file    Minutes per session: Not on file  . Stress: Not on file  Relationships  . Social Herbalist on phone: Not on file    Gets together: Not on file    Attends religious service: Not on file    Active member of club or organization: Not on file    Attends meetings of clubs or organizations: Not on file    Relationship status: Not on file  . Intimate partner violence    Fear of current or ex partner: Not on file    Emotionally abused: Not on file    Physically abused: Not on file    Forced sexual activity: Not on file  Other Topics Concern  . Not on file  Social History Narrative  . Not on file   Social History   Tobacco Use   Smoking Status Never Smoker  Smokeless Tobacco Never Used   Social History   Substance and Sexual Activity  Alcohol Use No  . Alcohol/week: 0.0 standard drinks    Family History:  Family History  Problem Relation Age of Onset  . Asthma Son   . Cancer Maternal Grandmother        breast, malignant  . Heart attack Maternal Grandfather   . Diabetes Maternal Grandfather        type 2    Past medical history, surgical history, medications, allergies, family history and social history reviewed with patient today and changes made to appropriate areas of the chart.   Review of Systems  Constitutional: Negative.   HENT: Positive for nosebleeds (got better with nose spray). Negative for congestion, ear discharge, ear pain, hearing loss, sinus pain, sore throat and tinnitus.   Eyes: Negative.   Respiratory: Negative.  Negative for stridor.   Cardiovascular: Positive for palpitations. Negative for chest pain, orthopnea, claudication, leg swelling and PND.  Gastrointestinal: Positive for heartburn and vomiting. Negative for abdominal pain, blood in stool, constipation, diarrhea, melena and nausea.  Genitourinary: Negative.   Musculoskeletal: Positive for back pain, myalgias and neck pain. Negative for falls and joint pain.  Skin: Negative.   Neurological: Positive for tingling. Negative for dizziness, tremors, sensory change, speech change, focal weakness, seizures, loss of consciousness, weakness and headaches.  Endo/Heme/Allergies: Positive for environmental allergies. Negative for polydipsia. Does not bruise/bleed easily.  Psychiatric/Behavioral: Negative for depression, hallucinations, memory loss, substance abuse and suicidal ideas. The patient is nervous/anxious. The patient does not have insomnia.     All other ROS negative except what is listed above and in the HPI.      Objective:    BP 102/68   Pulse 87   Temp 98.6 F (37 C) (Oral)   Ht 5' 3.5" (1.613 m)   Wt 164 lb (74.4  kg)   SpO2 100%   BMI 28.60 kg/m   Wt Readings from Last 3 Encounters:  05/18/19 164 lb (74.4 kg)  10/19/18 166 lb (75.3 kg)  07/27/18 166 lb (75.3 kg)    Physical Exam Vitals signs and nursing note reviewed.  Constitutional:      General: She is not in acute distress.    Appearance: Normal appearance. She is not ill-appearing, toxic-appearing or diaphoretic.  HENT:     Head: Normocephalic and atraumatic.     Right Ear: Tympanic membrane, ear canal and external ear normal. There is no impacted cerumen.  Left Ear: Tympanic membrane, ear canal and external ear normal. There is no impacted cerumen.     Nose: Nose normal. No congestion or rhinorrhea.     Mouth/Throat:     Mouth: Mucous membranes are moist.     Pharynx: Oropharynx is clear. No oropharyngeal exudate or posterior oropharyngeal erythema.  Eyes:     General: No scleral icterus.       Right eye: No discharge.        Left eye: No discharge.     Extraocular Movements: Extraocular movements intact.     Conjunctiva/sclera: Conjunctivae normal.     Pupils: Pupils are equal, round, and reactive to light.  Neck:     Musculoskeletal: Normal range of motion and neck supple. No neck rigidity or muscular tenderness.     Vascular: No carotid bruit.  Cardiovascular:     Rate and Rhythm: Normal rate and regular rhythm.     Pulses: Normal pulses.     Heart sounds: No murmur. No friction rub. No gallop.   Pulmonary:     Effort: Pulmonary effort is normal. No respiratory distress.     Breath sounds: Normal breath sounds. No stridor. No wheezing, rhonchi or rales.  Chest:     Chest wall: No tenderness.  Abdominal:     General: Abdomen is flat. Bowel sounds are normal. There is no distension.     Palpations: Abdomen is soft. There is no mass.     Tenderness: There is no abdominal tenderness. There is no right CVA tenderness, left CVA tenderness, guarding or rebound.     Hernia: No hernia is present.  Genitourinary:    Comments:  Breast and pelvic exams deferred with shared decision making Musculoskeletal:        General: No swelling, tenderness, deformity or signs of injury.     Right lower leg: No edema.     Left lower leg: No edema.  Lymphadenopathy:     Cervical: No cervical adenopathy.  Skin:    General: Skin is warm and dry.     Capillary Refill: Capillary refill takes less than 2 seconds.     Coloration: Skin is not jaundiced or pale.     Findings: No bruising, erythema, lesion or rash.  Neurological:     General: No focal deficit present.     Mental Status: She is alert and oriented to person, place, and time. Mental status is at baseline.     Cranial Nerves: No cranial nerve deficit.     Sensory: No sensory deficit.     Motor: No weakness.     Coordination: Coordination normal.     Gait: Gait normal.     Deep Tendon Reflexes: Reflexes normal.  Psychiatric:        Mood and Affect: Mood normal.        Behavior: Behavior normal.        Thought Content: Thought content normal.        Judgment: Judgment normal.     Results for orders placed or performed in visit on 12/02/17  IGP, Aptima HPV, rfx 16/18,45  Result Value Ref Range   DIAGNOSIS: Comment    Specimen adequacy: Comment    Clinician Provided ICD10 Comment    Performed by: Comment    PAP Smear Comment .    Note: Comment    Test Methodology Comment    HPV Aptima Negative Negative      Assessment & Plan:   Problem List Items Addressed This  Visit      Other   Low back pain    Under good control on current regimen. Continue current regimen. Continue to monitor. Call with any concerns. Refills given- should last 3 months. Follow up 3 months.        Relevant Medications   cyclobenzaprine (FLEXERIL) 10 MG tablet   oxyCODONE-acetaminophen (PERCOCET/ROXICET) 5-325 MG tablet    Other Visit Diagnoses    Routine general medical examination at a health care facility    -  Primary   Vaccines up to date/declined. Screening labs checked  today. Pap up to date. Continue diet and exercise. Call with any concerns.    Relevant Orders   CBC with Differential/Platelet   Comprehensive metabolic panel   Lipid Panel w/o Chol/HDL Ratio   Bayer DCA Hb A1c Waived   TSH   UA/M w/rflx Culture, Routine   Screening for cholesterol level       Labs checked today. Await results.    Relevant Orders   Lipid Panel w/o Chol/HDL Ratio   Acute stress reaction       Will start buspar for PRN use. Call with any concerns. Continue to monitor.    Relevant Medications   busPIRone (BUSPAR) 7.5 MG tablet   Gastroesophageal reflux disease, unspecified whether esophagitis present       Will start 2 weeks of omeprazole. Call if not getting better or getting worse.    Relevant Medications   omeprazole (PRILOSEC) 20 MG capsule       Follow up plan: Return in about 3 months (around 08/18/2019).   LABORATORY TESTING:  - Pap smear: up to date  IMMUNIZATIONS:   - Tdap: Tetanus vaccination status reviewed: last tetanus booster within 10 years. - Influenza: Refused - Pneumovax: Not applicable  PATIENT COUNSELING:   Advised to take 1 mg of folate supplement per day if capable of pregnancy.   Sexuality: Discussed sexually transmitted diseases, partner selection, use of condoms, avoidance of unintended pregnancy  and contraceptive alternatives.   Advised to avoid cigarette smoking.  I discussed with the patient that most people either abstain from alcohol or drink within safe limits (<=14/week and <=4 drinks/occasion for males, <=7/weeks and <= 3 drinks/occasion for females) and that the risk for alcohol disorders and other health effects rises proportionally with the number of drinks per week and how often a drinker exceeds daily limits.  Discussed cessation/primary prevention of drug use and availability of treatment for abuse.   Diet: Encouraged to adjust caloric intake to maintain  or achieve ideal body weight, to reduce intake of dietary  saturated fat and total fat, to limit sodium intake by avoiding high sodium foods and not adding table salt, and to maintain adequate dietary potassium and calcium preferably from fresh fruits, vegetables, and low-fat dairy products.    stressed the importance of regular exercise  Injury prevention: Discussed safety belts, safety helmets, smoke detector, smoking near bedding or upholstery.   Dental health: Discussed importance of regular tooth brushing, flossing, and dental visits.    NEXT PREVENTATIVE PHYSICAL DUE IN 1 YEAR. Return in about 3 months (around 08/18/2019).

## 2019-05-18 NOTE — Assessment & Plan Note (Signed)
Under good control on current regimen. Continue current regimen. Continue to monitor. Call with any concerns. Refills given- should last 3 months. Follow up 3 months.

## 2019-05-18 NOTE — Patient Instructions (Signed)
The Mindfulness App.  ?Headspace.  ?Calm.  ?MINDBODY.  ?Buddhify.  ?Insight Timer.  ?Smiling Mind.  ?Meditation Timer Pro.  ?Aura  ? ?

## 2019-05-19 LAB — COMPREHENSIVE METABOLIC PANEL
ALT: 15 IU/L (ref 0–32)
AST: 22 IU/L (ref 0–40)
Albumin/Globulin Ratio: 1.8 (ref 1.2–2.2)
Albumin: 4.4 g/dL (ref 3.8–4.8)
Alkaline Phosphatase: 58 IU/L (ref 39–117)
BUN/Creatinine Ratio: 10 (ref 9–23)
BUN: 7 mg/dL (ref 6–20)
Bilirubin Total: 0.3 mg/dL (ref 0.0–1.2)
CO2: 25 mmol/L (ref 20–29)
Calcium: 9.3 mg/dL (ref 8.7–10.2)
Chloride: 104 mmol/L (ref 96–106)
Creatinine, Ser: 0.71 mg/dL (ref 0.57–1.00)
GFR calc Af Amer: 127 mL/min/{1.73_m2} (ref >59)
GFR calc non Af Amer: 110 mL/min/{1.73_m2} (ref >59)
Globulin, Total: 2.4 g/dL (ref 1.5–4.5)
Glucose: 65 mg/dL (ref 65–99)
Potassium: 4.1 mmol/L (ref 3.5–5.2)
Sodium: 144 mmol/L (ref 134–144)
Total Protein: 6.8 g/dL (ref 6.0–8.5)

## 2019-05-19 LAB — CBC WITH DIFFERENTIAL/PLATELET
Basophils Absolute: 0.1 10*3/uL (ref 0.0–0.2)
Basos: 1 %
EOS (ABSOLUTE): 0.2 10*3/uL (ref 0.0–0.4)
Eos: 3 %
Hematocrit: 37.5 % (ref 34.0–46.6)
Hemoglobin: 12.6 g/dL (ref 11.1–15.9)
Immature Grans (Abs): 0 10*3/uL (ref 0.0–0.1)
Immature Granulocytes: 0 %
Lymphocytes Absolute: 1.6 10*3/uL (ref 0.7–3.1)
Lymphs: 25 %
MCH: 31 pg (ref 26.6–33.0)
MCHC: 33.6 g/dL (ref 31.5–35.7)
MCV: 92 fL (ref 79–97)
Monocytes Absolute: 0.6 10*3/uL (ref 0.1–0.9)
Monocytes: 9 %
Neutrophils Absolute: 3.9 10*3/uL (ref 1.4–7.0)
Neutrophils: 62 %
Platelets: 304 10*3/uL (ref 150–450)
RBC: 4.07 x10E6/uL (ref 3.77–5.28)
RDW: 12.1 % (ref 11.7–15.4)
WBC: 6.3 10*3/uL (ref 3.4–10.8)

## 2019-05-19 LAB — TSH: TSH: 0.795 u[IU]/mL (ref 0.450–4.500)

## 2019-05-19 LAB — LIPID PANEL W/O CHOL/HDL RATIO
Cholesterol, Total: 161 mg/dL (ref 100–199)
HDL: 74 mg/dL (ref >39)
LDL Chol Calc (NIH): 70 mg/dL (ref 0–99)
Triglycerides: 96 mg/dL (ref 0–149)
VLDL Cholesterol Cal: 17 mg/dL (ref 5–40)

## 2019-06-28 ENCOUNTER — Other Ambulatory Visit: Payer: Self-pay

## 2019-06-28 DIAGNOSIS — Z20822 Contact with and (suspected) exposure to covid-19: Secondary | ICD-10-CM

## 2019-07-01 LAB — NOVEL CORONAVIRUS, NAA: SARS-CoV-2, NAA: NOT DETECTED

## 2019-07-17 ENCOUNTER — Encounter: Payer: Self-pay | Admitting: Family Medicine

## 2019-07-18 ENCOUNTER — Encounter: Payer: Self-pay | Admitting: Family Medicine

## 2019-07-18 ENCOUNTER — Other Ambulatory Visit: Payer: Self-pay

## 2019-07-18 ENCOUNTER — Ambulatory Visit (INDEPENDENT_AMBULATORY_CARE_PROVIDER_SITE_OTHER): Payer: Managed Care, Other (non HMO) | Admitting: Family Medicine

## 2019-07-18 DIAGNOSIS — F4321 Adjustment disorder with depressed mood: Secondary | ICD-10-CM | POA: Diagnosis not present

## 2019-07-18 MED ORDER — DULOXETINE HCL 20 MG PO CPEP: 20.0000 mg | ORAL_CAPSULE | Freq: Every day | ORAL | 3 refills | Status: DC

## 2019-07-18 MED ORDER — CLONAZEPAM 0.5 MG PO TABS: 0.5000 mg | ORAL_TABLET | Freq: Two times a day (BID) | ORAL | 1 refills | Status: DC | PRN

## 2019-07-18 NOTE — Progress Notes (Signed)
There were no vitals taken for this visit.   Subjective:    Patient ID: Theresa Roberts, female    DOB: [birth year], 38 y.o.   MRN: [id number]  HPI: Theresa Roberts is a 38 y.o. female  Chief Complaint  Patient presents with  . Depression   DEPRESSION- just broke up with her fiance of 4 years. She is not doing well. Has been crying a lot, not feeling like herself at all.  Mood status: exacerbated Satisfied with current treatment?: not on anything Symptom severity: severe  Duration of current treatment : not on anything Psychotherapy/counseling: yes  Previous psychiatric medications: buspar Depressed mood: yes Anxious mood: yes Anhedonia: no Significant weight loss or gain: no Insomnia: yes hard to fall asleep Fatigue: yes Feelings of worthlessness or guilt: yes Impaired concentration/indecisiveness: no Suicidal ideations: no Hopelessness: no Crying spells: yes Depression screen [facility] 2/9 07/18/2019 05/18/2019 12/02/2017 08/13/2016  Decreased Interest 3 0 0 0  Down, Depressed, Hopeless 2 0 0 0  PHQ - 2 Score 5 0 0 0  Altered sleeping 3 1 2  -  Tired, decreased energy 0 0 2 -  Change in appetite 3 0 0 -  Feeling bad or failure about yourself  3 0 0 -  Trouble concentrating 3 0 1 -  Moving slowly or fidgety/restless 0 0 2 -  Suicidal thoughts 0 0 0 -  PHQ-9 Score 17 1 7  -  Difficult doing work/chores Extremely dIfficult Not difficult at all - -   Relevant past medical, surgical, family and social history reviewed and updated as indicated. Interim medical history since our last visit reviewed. Allergies and medications reviewed and updated.  Review of Systems  Constitutional: Negative.   Respiratory: Negative.   Cardiovascular: Negative.   Musculoskeletal: Negative.   Skin: Negative.   Psychiatric/Behavioral: Positive for decreased concentration, dysphoric mood and sleep disturbance. Negative for agitation, behavioral problems, confusion, hallucinations, self-injury and  suicidal ideas. The patient is nervous/anxious. The patient is not hyperactive.     Per HPI unless specifically indicated above     Objective:    There were no vitals taken for this visit.  Wt Readings from Last 3 Encounters:  05/18/19 164 lb (74.4 kg)  10/19/18 166 lb (75.3 kg)  07/27/18 166 lb (75.3 kg)    Physical Exam Vitals signs and nursing note reviewed.  Constitutional:      General: She is not in acute distress.    Appearance: Normal appearance. She is not ill-appearing, toxic-appearing or diaphoretic.  HENT:     Head: Normocephalic and atraumatic.     Right Ear: External ear normal.     Left Ear: External ear normal.     Nose: Nose normal.     Mouth/Throat:     Mouth: Mucous membranes are moist.     Pharynx: Oropharynx is clear.  Eyes:     General: No scleral icterus.       Right eye: No discharge.        Left eye: No discharge.     Conjunctiva/sclera: Conjunctivae normal.     Pupils: Pupils are equal, round, and reactive to light.  Neck:     Musculoskeletal: Normal range of motion.  Pulmonary:     Effort: Pulmonary effort is normal. No respiratory distress.     Comments: Speaking in full sentences Musculoskeletal: Normal range of motion.  Skin:    Coloration: Skin is not jaundiced or pale.     Findings: No bruising, erythema, lesion  or rash.  Neurological:     Mental Status: She is alert and oriented to person, place, and time. Mental status is at baseline.  Psychiatric:        Mood and Affect: Mood normal.        Behavior: Behavior normal.        Thought Content: Thought content normal.        Judgment: Judgment normal.     Results for orders placed or performed in visit on 06/28/19  Novel Coronavirus, NAA ([facility])   Specimen: Oropharyngeal(OP) collection in vial transport medium   OROPHARYNGEA  TESTING  Result Value Ref Range   SARS-CoV-2, NAA Not Detected Not Detected      Assessment & Plan:   Problem List Items Addressed This Visit     None    Visit Diagnoses    Adjustment disorder with depressed mood    -  Primary   Not doing well. Will start cymbalta and get into counsleing. Will start klonopin PRN. REchec 2-4 weeks. Call with any concerns.        Follow up plan: Return 2-4 weeks, for follow up mood.    . This visit was completed via Doximity due to the restrictions of the COVID-19 pandemic. All issues as above were discussed and addressed. Physical exam was done as above through visual confirmation on doximity. If it was felt that the patient should be evaluated in the office, they were directed there. The patient verbally consented to this visit. . Location of the patient: home . Location of the provider: home . Those involved with this call:  . Provider: Park Liter, DO . CMA: Tiffany Reel, CMA . Front Desk/Registration: Don Perking  . Time spent on call: 15 minutes with patient face to face via video conference. More than 50% of this time was spent in counseling and coordination of care. 23 minutes total spent in review of patient's record and preparation of their chart.

## 2019-08-21 ENCOUNTER — Encounter: Payer: Self-pay | Admitting: Family Medicine

## 2019-08-22 ENCOUNTER — Encounter (INDEPENDENT_AMBULATORY_CARE_PROVIDER_SITE_OTHER): Payer: Self-pay

## 2019-08-22 ENCOUNTER — Encounter: Payer: Self-pay | Admitting: Family Medicine

## 2019-08-22 ENCOUNTER — Telehealth: Payer: Self-pay

## 2019-08-22 ENCOUNTER — Other Ambulatory Visit: Payer: Self-pay

## 2019-08-22 ENCOUNTER — Ambulatory Visit (INDEPENDENT_AMBULATORY_CARE_PROVIDER_SITE_OTHER): Payer: Managed Care, Other (non HMO) | Admitting: Family Medicine

## 2019-08-22 DIAGNOSIS — M5441 Lumbago with sciatica, right side: Secondary | ICD-10-CM | POA: Diagnosis not present

## 2019-08-22 DIAGNOSIS — U071 COVID-19: Secondary | ICD-10-CM

## 2019-08-22 DIAGNOSIS — G8929 Other chronic pain: Secondary | ICD-10-CM | POA: Diagnosis not present

## 2019-08-22 NOTE — Assessment & Plan Note (Signed)
Under good control on current regimen. Continue current regimen. Continue to monitor. Call with any concerns. Refills given today. Should last 3 months. Follow up 3 months.

## 2019-08-22 NOTE — Progress Notes (Signed)
There were no vitals taken for this visit.   Subjective:    Patient ID: Theresa Roberts, female    DOB: [birth year], 39 y.o.   MRN: [id number]  HPI: Theresa Roberts is a 39 y.o. female  Chief Complaint  Patient presents with  . covid POSITIVE    diarrhea and fatigue  . Pain   Ailine and her son tested positive for COVID on Tuesday. Started feeling bad on Sunday Has been very tired. Has been having diarrhea. Has had a bad headache.  Still feeling bad- having issues with her break up.   CHRONIC PAIN  Present dose: 15 Morphine equivalents Pain control status: stable Duration: chronic Location: low back Quality: aching and sore Current Pain Level: moderate Previous Pain Level: moderate Breakthrough pain: yes Benefit from narcotic medications: yes What Activities task can be accomplished with current medication? Able to work and care for her family Interested in weaning off narcotics:no   Stool softners/OTC fiber: yes  Previous pain specialty evaluation: no Non-narcotic analgesic meds: yes Narcotic contract: yes   Relevant past medical, surgical, family and social history reviewed and updated as indicated. Interim medical history since our last visit reviewed. Allergies and medications reviewed and updated.  Review of Systems  Constitutional: Positive for fatigue. Negative for activity change, appetite change, chills, diaphoresis, fever and unexpected weight change.  HENT: Negative.   Respiratory: Negative.   Cardiovascular: Negative.   Gastrointestinal: Positive for diarrhea. Negative for abdominal distention, abdominal pain, anal bleeding, blood in stool, constipation, nausea, rectal pain and vomiting.  Musculoskeletal: Positive for arthralgias, back pain and myalgias. Negative for gait problem, joint swelling, neck pain and neck stiffness.  Skin: Negative.   Neurological: Negative.   Psychiatric/Behavioral: Negative.     Per HPI unless specifically indicated  above     Objective:    There were no vitals taken for this visit.  Wt Readings from Last 3 Encounters:  05/18/19 164 lb (74.4 kg)  10/19/18 166 lb (75.3 kg)  07/27/18 166 lb (75.3 kg)    Physical Exam Vitals and nursing note reviewed.  Constitutional:      General: She is not in acute distress.    Appearance: Normal appearance. She is not ill-appearing, toxic-appearing or diaphoretic.  HENT:     Head: Normocephalic and atraumatic.     Right Ear: External ear normal.     Left Ear: External ear normal.     Nose: Nose normal.     Mouth/Throat:     Mouth: Mucous membranes are moist.     Pharynx: Oropharynx is clear.  Eyes:     General: No scleral icterus.       Right eye: No discharge.        Left eye: No discharge.     Conjunctiva/sclera: Conjunctivae normal.     Pupils: Pupils are equal, round, and reactive to light.  Pulmonary:     Effort: Pulmonary effort is normal. No respiratory distress.     Comments: Speaking in full sentences Musculoskeletal:        General: Normal range of motion.     Cervical back: Normal range of motion.  Skin:    Coloration: Skin is not jaundiced or pale.     Findings: No bruising, erythema, lesion or rash.  Neurological:     Mental Status: She is alert and oriented to person, place, and time. Mental status is at baseline.  Psychiatric:        Mood and Affect: Mood  normal.        Behavior: Behavior normal.        Thought Content: Thought content normal.        Judgment: Judgment normal.     Results for orders placed or performed in visit on 06/28/19  Novel Coronavirus, NAA ([facility])   Specimen: Oropharyngeal(OP) collection in vial transport medium   OROPHARYNGEA  TESTING  Result Value Ref Range   SARS-CoV-2, NAA Not Detected Not Detected      Assessment & Plan:   Problem List Items Addressed This Visit      Other   Low back pain    Under good control on current regimen. Continue current regimen. Continue to monitor. Call with  any concerns. Refills given today. Should last 3 months. Follow up 3 months.        Other Visit Diagnoses    COVID-19    -  Primary   Symptomatic care. Rest and fluids. Quarantine until 1/24. Call with any concerns or if getting worse.    Relevant Orders   MyChart COVID-19 home monitoring program   Temperature monitoring       Follow up plan: Return in about 3 months (around 11/20/2019).   . This visit was completed via Doximity due to the restrictions of the COVID-19 pandemic. All issues as above were discussed and addressed. Physical exam was done as above through visual confirmation on Doximity. If it was felt that the patient should be evaluated in the office, they were directed there. The patient verbally consented to this visit. . Location of the patient: home . Location of the provider: home . Those involved with this call:  . Provider: Park Liter, DO . CMA: Tiffany Reel, CMA . Front Desk/Registration: Don Perking  . Time spent on call: 25 minutes with patient face to face via video conference. More than 50% of this time was spent in counseling and coordination of care. 40 minutes total spent in review of patient's record and preparation of their chart.

## 2019-08-22 NOTE — Telephone Encounter (Signed)
Patient states that she has not been able to eat since Monday but she will try to something today. Patient advise on protocol as follows:   If appetite becomes worse: encourage patient to drink fluids as tolerated, work their way up to bland solid food such as crackers, pretzels, soup, bread or applesauce and boiled starches.   If patient is unable to tolerate any foods or liquids, notify PCP.   IF PATIENT DEVELOPS SEVERE VOMITING (MORE THAN 6 TIMES A DAY AND OR >8 HOURS) AND/OR SEVERE ABDOMINAL PAIN ADVISE PATIENT TO CALL 911 AND SEEK TREATMENT IN ED  Patient verbalized understanding and will continue to monitor

## 2019-08-23 ENCOUNTER — Encounter (INDEPENDENT_AMBULATORY_CARE_PROVIDER_SITE_OTHER): Payer: Self-pay

## 2019-08-24 ENCOUNTER — Ambulatory Visit: Payer: Managed Care, Other (non HMO) | Admitting: Family Medicine

## 2019-08-25 ENCOUNTER — Encounter (INDEPENDENT_AMBULATORY_CARE_PROVIDER_SITE_OTHER): Payer: Self-pay

## 2019-08-26 ENCOUNTER — Encounter (INDEPENDENT_AMBULATORY_CARE_PROVIDER_SITE_OTHER): Payer: Self-pay

## 2019-08-27 ENCOUNTER — Encounter (INDEPENDENT_AMBULATORY_CARE_PROVIDER_SITE_OTHER): Payer: Self-pay

## 2019-08-28 ENCOUNTER — Encounter (INDEPENDENT_AMBULATORY_CARE_PROVIDER_SITE_OTHER): Payer: Self-pay

## 2019-08-28 ENCOUNTER — Telehealth: Payer: Self-pay | Admitting: *Deleted

## 2019-08-28 NOTE — Telephone Encounter (Signed)
Attempted to reach pt after receiving BPA for new onset cough and weakness. Left message to CB as unable to address in Covid monitoring questionnaire.

## 2019-08-29 ENCOUNTER — Encounter (INDEPENDENT_AMBULATORY_CARE_PROVIDER_SITE_OTHER): Payer: Self-pay

## 2019-08-29 ENCOUNTER — Other Ambulatory Visit: Payer: Self-pay

## 2019-08-29 MED ORDER — OXYCODONE-ACETAMINOPHEN 5-325 MG PO TABS: 1.0000 | ORAL_TABLET | Freq: Two times a day (BID) | ORAL | 0 refills | Status: DC | PRN

## 2019-08-30 ENCOUNTER — Telehealth: Payer: Self-pay

## 2019-08-30 NOTE — Telephone Encounter (Signed)
PA Approved

## 2019-08-30 NOTE — Telephone Encounter (Signed)
Prior Authorization initiated via CoverMyMeds for Oxycodone-acetaminophen 5-325mg  tablet. Key: [id number]

## 2019-08-31 ENCOUNTER — Encounter (INDEPENDENT_AMBULATORY_CARE_PROVIDER_SITE_OTHER): Payer: Self-pay

## 2019-09-01 ENCOUNTER — Encounter (INDEPENDENT_AMBULATORY_CARE_PROVIDER_SITE_OTHER): Payer: Self-pay

## 2019-09-02 ENCOUNTER — Encounter (INDEPENDENT_AMBULATORY_CARE_PROVIDER_SITE_OTHER): Payer: Self-pay

## 2019-09-04 ENCOUNTER — Encounter (INDEPENDENT_AMBULATORY_CARE_PROVIDER_SITE_OTHER): Payer: Self-pay

## 2019-09-06 IMAGING — DX DG LUMBAR SPINE COMPLETE 4+V
5 series · 5 of 5 positions shown · non-contrast
Comparison: 03/01/2016 MR.

CLINICAL DATA: 36-year-old female with chronic low back pain.
Initial encounter.

EXAM:
LUMBAR SPINE - COMPLETE 4+ VIEW

[l-spine ap]
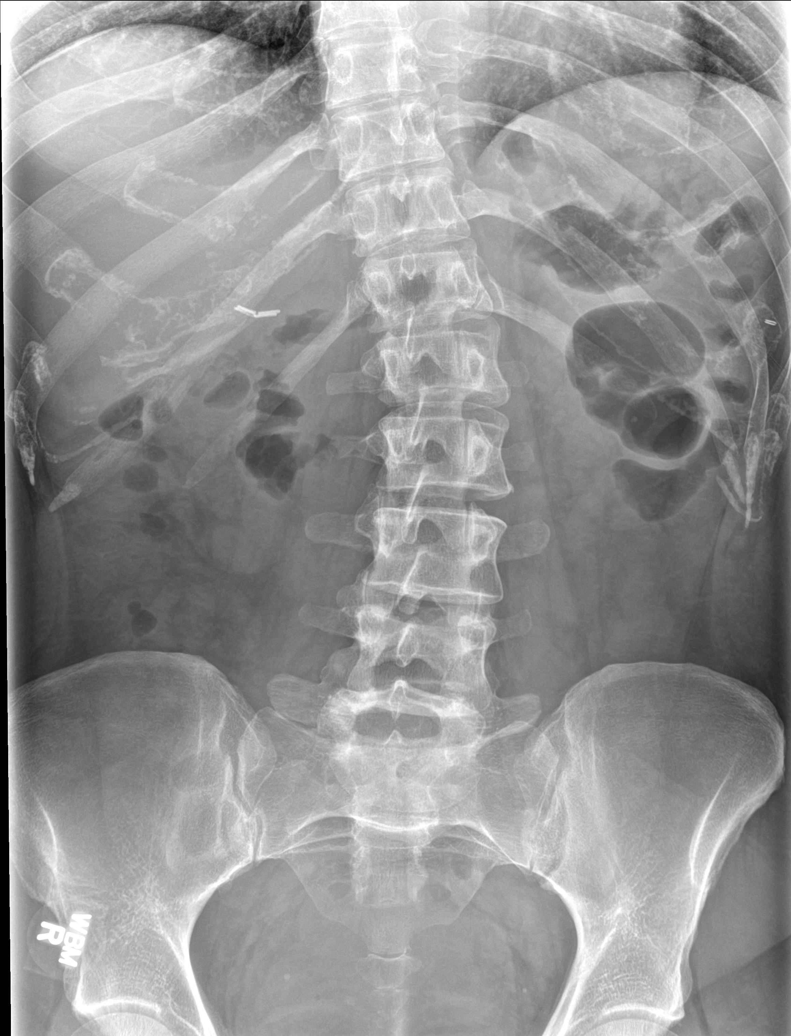

[l-spine obl (1 of 2)]
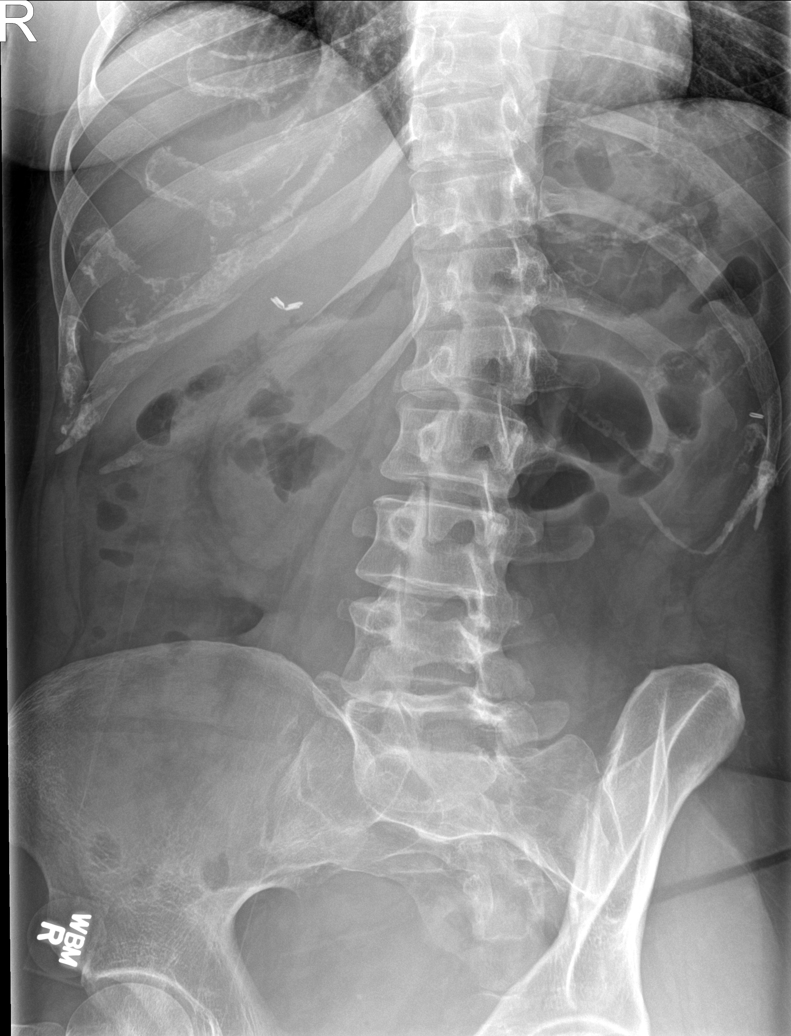

[l-spine obl (2 of 2)]
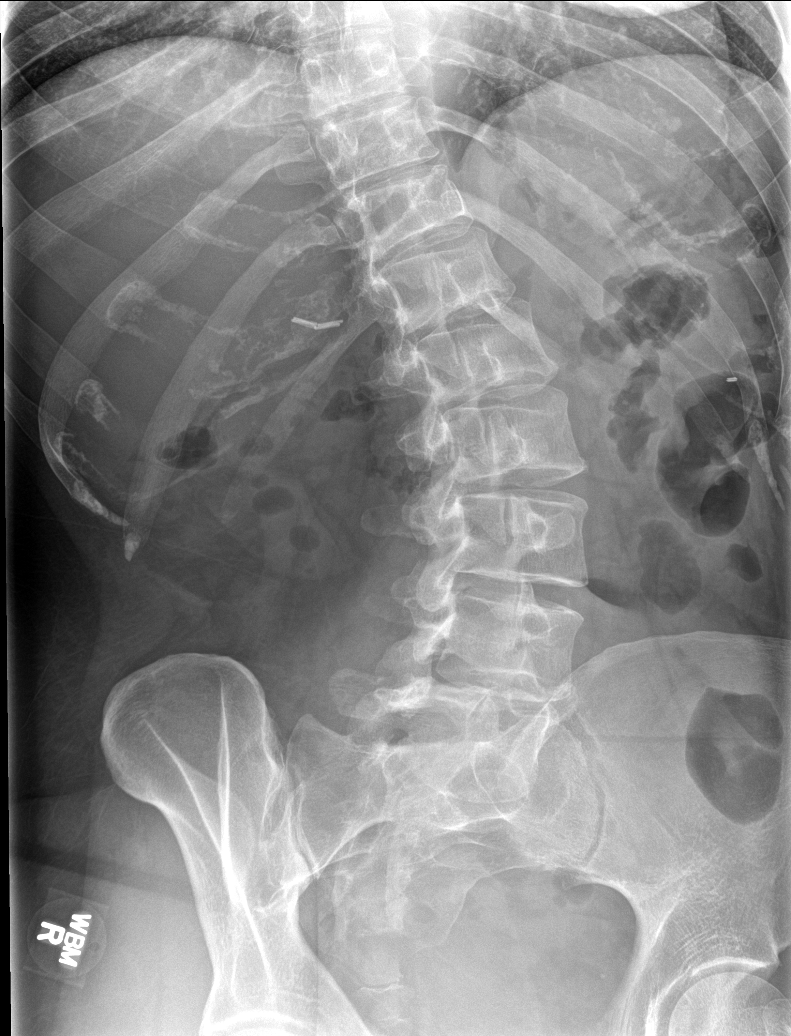

[l-spine lat]
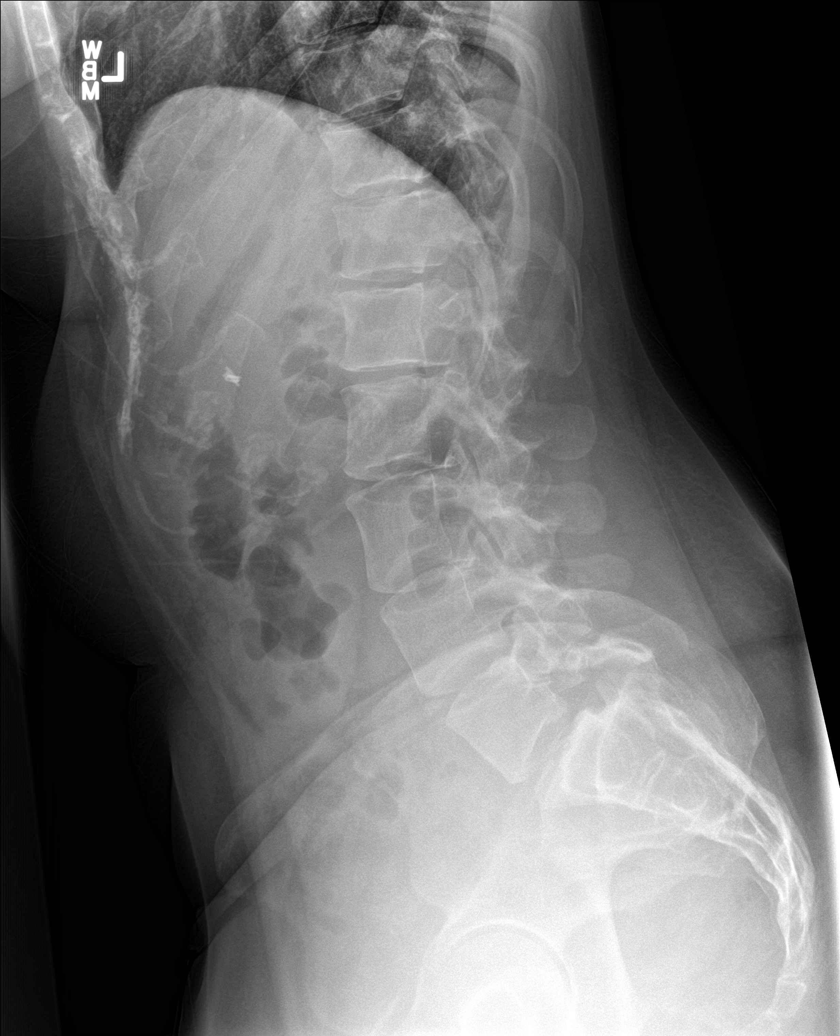

[l-spine spot]
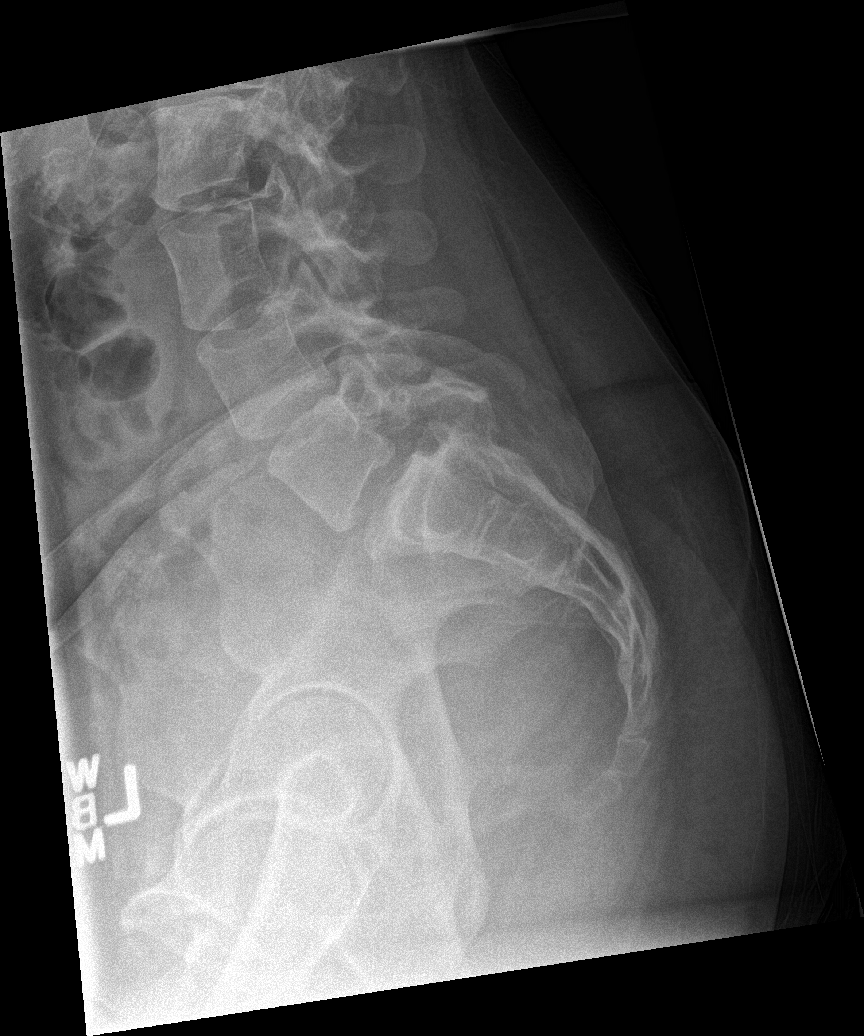

[5 of 5 positions shown; findings below may reference images not displayed]

FINDINGS: Scoliosis lower thoracic and lumbar spine convex left. Bilateral L5
pars defects with minimal anterior slip L5. Minimal right-sided L1-2
and L2-3 disc space narrowing. No compression fracture.
IMPRESSION: 1. Scoliosis lower thoracic lumbar spine convex left.
2. Bilateral L5 pars defects with 2 mm anterior slip L5.
3. Minimal right-sided L1-2 and L2-3 disc space narrowing.

## 2019-09-11 ENCOUNTER — Encounter: Payer: Self-pay | Admitting: Family Medicine

## 2019-09-26 NOTE — Telephone Encounter (Signed)
Nexplanon rcvd/charged 07/27/18

## 2019-11-15 ENCOUNTER — Other Ambulatory Visit: Payer: Self-pay | Admitting: Family Medicine

## 2019-11-23 ENCOUNTER — Ambulatory Visit: Payer: Managed Care, Other (non HMO) | Admitting: Family Medicine

## 2019-11-23 ENCOUNTER — Encounter: Payer: Self-pay | Admitting: Family Medicine

## 2019-11-23 ENCOUNTER — Other Ambulatory Visit: Payer: Self-pay

## 2019-11-23 DIAGNOSIS — M5441 Lumbago with sciatica, right side: Secondary | ICD-10-CM | POA: Diagnosis not present

## 2019-11-23 DIAGNOSIS — G8929 Other chronic pain: Secondary | ICD-10-CM

## 2019-11-23 NOTE — Progress Notes (Signed)
BP 105/69 (BP Location: Left Arm, Patient Position: Sitting, Cuff Size: Normal)   Pulse 88   Temp 99 F (37.2 C) (Oral)   Wt 143 lb 6.4 oz (65 kg)   SpO2 98%   BMI 25.00 kg/m    Subjective:    Patient ID: Theresa Roberts, female    DOB: 25-Sep-1980, 39 y.o.   MRN: [id number]  HPI: Theresa Roberts is a 39 y.o. female  Chief Complaint  Patient presents with  . Pain   CHRONIC PAIN  Present dose: 15 Morphine equivalents Pain control status: stable Duration: chronic Location: low back Quality: aching and sore, catching Current Pain Level: moderate Previous Pain Level: moderate Breakthrough pain: no Benefit from narcotic medications: yes What Activities task can be accomplished with current medication? Able to do all her daily activities and exercise.  Interested in weaning off narcotics:yes   Stool softners/OTC fiber: no  Previous pain specialty evaluation: no Non-narcotic analgesic meds: no Narcotic contract: yes  Mood is doing better. Dealing with her break up better, but still very sad. Angry now too.   Relevant past medical, surgical, family and social history reviewed and updated as indicated. Interim medical history since our last visit reviewed. Allergies and medications reviewed and updated.  Review of Systems  Constitutional: Negative.   Respiratory: Negative.   Cardiovascular: Negative.   Gastrointestinal: Negative.   Musculoskeletal: Positive for arthralgias, back pain and myalgias. Negative for gait problem, joint swelling, neck pain and neck stiffness.  Skin: Negative.   Neurological: Negative.   Psychiatric/Behavioral: Negative.     Per HPI unless specifically indicated above     Objective:    BP 105/69 (BP Location: Left Arm, Patient Position: Sitting, Cuff Size: Normal)   Pulse 88   Temp 99 F (37.2 C) (Oral)   Wt 143 lb 6.4 oz (65 kg)   SpO2 98%   BMI 25.00 kg/m   Wt Readings from Last 3 Encounters:  11/23/19 143 lb 6.4 oz (65 kg)    05/18/19 164 lb (74.4 kg)  10/19/18 166 lb (75.3 kg)    Physical Exam Vitals and nursing note reviewed.  Constitutional:      General: She is not in acute distress.    Appearance: Normal appearance. She is not ill-appearing, toxic-appearing or diaphoretic.  HENT:     Head: Normocephalic and atraumatic.     Right Ear: External ear normal.     Left Ear: External ear normal.     Nose: Nose normal.     Mouth/Throat:     Mouth: Mucous membranes are moist.     Pharynx: Oropharynx is clear.  Eyes:     General: No scleral icterus.       Right eye: No discharge.        Left eye: No discharge.     Extraocular Movements: Extraocular movements intact.     Conjunctiva/sclera: Conjunctivae normal.     Pupils: Pupils are equal, round, and reactive to light.  Cardiovascular:     Rate and Rhythm: Normal rate and regular rhythm.     Pulses: Normal pulses.     Heart sounds: Normal heart sounds. No murmur. No friction rub. No gallop.   Pulmonary:     Effort: Pulmonary effort is normal. No respiratory distress.     Breath sounds: Normal breath sounds. No stridor. No wheezing, rhonchi or rales.  Chest:     Chest wall: No tenderness.  Musculoskeletal:        General:  Normal range of motion.     Cervical back: Normal range of motion and neck supple.  Skin:    General: Skin is warm and dry.     Capillary Refill: Capillary refill takes less than 2 seconds.     Coloration: Skin is not jaundiced or pale.     Findings: No bruising, erythema, lesion or rash.  Neurological:     General: No focal deficit present.     Mental Status: She is alert and oriented to person, place, and time. Mental status is at baseline.  Psychiatric:        Mood and Affect: Mood normal.        Behavior: Behavior normal.        Thought Content: Thought content normal.        Judgment: Judgment normal.     Results for orders placed or performed in visit on 06/28/19  Novel Coronavirus, NAA ([facility])   Specimen:  Oropharyngeal(OP) collection in vial transport medium   OROPHARYNGEA  TESTING  Result Value Ref Range   SARS-CoV-2, NAA Not Detected Not Detected      Assessment & Plan:   Problem List Items Addressed This Visit      Other   Low back pain    Under good control today. Continue current regimen. Taking percocet very occasionally. Refill given today. Should last at least 3 months. Follow up 3 months. Call with any concerns.       Relevant Medications   oxyCODONE-acetaminophen (PERCOCET/ROXICET) 5-325 MG tablet   cyclobenzaprine (FLEXERIL) 10 MG tablet       Follow up plan: Return in about 3 months (around 02/22/2020).

## 2019-11-25 MED ORDER — GABAPENTIN 100 MG PO CAPS: ORAL_CAPSULE | ORAL | 1 refills | Status: DC

## 2019-11-25 MED ORDER — CYCLOBENZAPRINE HCL 10 MG PO TABS: 10.0000 mg | ORAL_TABLET | Freq: Every day | ORAL | 1 refills | Status: DC

## 2019-11-25 MED ORDER — OXYCODONE-ACETAMINOPHEN 5-325 MG PO TABS: 1.0000 | ORAL_TABLET | Freq: Two times a day (BID) | ORAL | 0 refills | Status: DC | PRN

## 2019-11-25 MED ORDER — ACYCLOVIR 800 MG PO TABS: 800.0000 mg | ORAL_TABLET | Freq: Two times a day (BID) | ORAL | 12 refills | Status: DC

## 2019-11-25 NOTE — Assessment & Plan Note (Signed)
Under good control today. Continue current regimen. Taking percocet very occasionally. Refill given today. Should last at least 3 months. Follow up 3 months. Call with any concerns.

## 2020-02-17 ENCOUNTER — Other Ambulatory Visit: Payer: Self-pay | Admitting: Family Medicine

## 2020-02-29 ENCOUNTER — Telehealth (INDEPENDENT_AMBULATORY_CARE_PROVIDER_SITE_OTHER): Payer: Managed Care, Other (non HMO) | Admitting: Family Medicine

## 2020-02-29 ENCOUNTER — Encounter: Payer: Self-pay | Admitting: Family Medicine

## 2020-02-29 DIAGNOSIS — G8929 Other chronic pain: Secondary | ICD-10-CM | POA: Diagnosis not present

## 2020-02-29 DIAGNOSIS — M5441 Lumbago with sciatica, right side: Secondary | ICD-10-CM | POA: Diagnosis not present

## 2020-02-29 DIAGNOSIS — R41840 Attention and concentration deficit: Secondary | ICD-10-CM

## 2020-02-29 DIAGNOSIS — F4321 Adjustment disorder with depressed mood: Secondary | ICD-10-CM

## 2020-02-29 MED ORDER — BUSPIRONE HCL 7.5 MG PO TABS: 7.5000 mg | ORAL_TABLET | Freq: Three times a day (TID) | ORAL | 2 refills | Status: DC | PRN

## 2020-02-29 MED ORDER — BUPROPION HCL ER (SR) 150 MG PO TB12: ORAL_TABLET | ORAL | 2 refills | Status: DC

## 2020-02-29 MED ORDER — OXYCODONE-ACETAMINOPHEN 5-325 MG PO TABS: 1.0000 | ORAL_TABLET | Freq: Two times a day (BID) | ORAL | 0 refills | Status: DC | PRN

## 2020-02-29 NOTE — Progress Notes (Signed)
There were no vitals taken for this visit.   Subjective:    Patient ID: Theresa Roberts, female    DOB: [birth year], 39 y.o.   MRN: [id number]  HPI: Theresa Roberts is a 39 y.o. female  Chief Complaint  Patient presents with  . Anxiety  . Pain   CHRONIC PAIN  Present dose: 15 Morphine equivalents Pain control status: stable Duration: chronic Location:  Quality: aching and sore Current Pain Level: moderate Previous Pain Level: severe Breakthrough pain: no Benefit from narcotic medications: yes What Activities task can be accomplished with current medication? Able to do her ADLs Interested in weaning off narcotics:no   Stool softners/OTC fiber: no  Previous pain specialty evaluation: no Non-narcotic analgesic meds: yes Narcotic contract: yes  ANXIETY/STRESS Duration: chronic Status:stable Anxious mood: yes  Excessive worrying: no Irritability: no  Sweating: no Nausea: no Palpitations:no Hyperventilation: no Panic attacks: no Agoraphobia: no  Obscessions/compulsions: no Depressed mood: no Depression screen [facility] 2/9 02/29/2020 07/18/2019 05/18/2019 12/02/2017 08/13/2016  Decreased Interest 1 3 0 0 0  Down, Depressed, Hopeless 1 2 0 0 0  PHQ - 2 Score 2 5 0 0 0  Altered sleeping 1 3 1 2  -  Tired, decreased energy 1 0 0 2 -  Change in appetite 0 3 0 0 -  Feeling bad or failure about yourself  0 3 0 0 -  Trouble concentrating 1 3 0 1 -  Moving slowly or fidgety/restless 0 0 0 2 -  Suicidal thoughts 0 0 0 0 -  PHQ-9 Score 5 17 1 7  -  Difficult doing work/chores Not difficult at all Extremely dIfficult Not difficult at all - -   GAD 7 : Generalized Anxiety Score 02/29/2020  Nervous, Anxious, on Edge 1  Control/stop worrying 0  Worry too much - different things 0  Trouble relaxing 2  Restless 1  Easily annoyed or irritable 0  Afraid - awful might happen 0  Total GAD 7 Score 4  Anxiety Difficulty Not difficult at all   Anhedonia: no Weight changes:  no Insomnia: no   Hypersomnia: no Fatigue/loss of energy: yes Feelings of worthlessness: no Feelings of guilt: no Impaired concentration/indecisiveness: yes Suicidal ideations: no  Crying spells: no Recent Stressors/Life Changes: yes   Relationship problems: yes   Family stress: yes     Financial stress: yes    Job stress: yes    Recent death/loss: no  Relevant past medical, surgical, family and social history reviewed and updated as indicated. Interim medical history since our last visit reviewed. Allergies and medications reviewed and updated.  Review of Systems  Constitutional: Negative.   Respiratory: Negative.   Cardiovascular: Negative.   Gastrointestinal: Negative.   Musculoskeletal: Positive for back pain and myalgias. Negative for arthralgias, gait problem, joint swelling, neck pain and neck stiffness.  Skin: Negative.   Psychiatric/Behavioral: Positive for decreased concentration. Negative for agitation, behavioral problems, confusion, dysphoric mood, hallucinations, self-injury, sleep disturbance and suicidal ideas. The patient is nervous/anxious. The patient is not hyperactive.     Per HPI unless specifically indicated above     Objective:    There were no vitals taken for this visit.  Wt Readings from Last 3 Encounters:  11/23/19 143 lb 6.4 oz (65 kg)  05/18/19 164 lb (74.4 kg)  10/19/18 166 lb (75.3 kg)    Physical Exam Vitals and nursing note reviewed.  Constitutional:      General: She is not in acute distress.  Appearance: Normal appearance. She is not ill-appearing, toxic-appearing or diaphoretic.  HENT:     Head: Normocephalic and atraumatic.     Right Ear: External ear normal.     Left Ear: External ear normal.     Nose: Nose normal.     Mouth/Throat:     Mouth: Mucous membranes are moist.     Pharynx: Oropharynx is clear.  Eyes:     General: No scleral icterus.       Right eye: No discharge.        Left eye: No discharge.      Conjunctiva/sclera: Conjunctivae normal.     Pupils: Pupils are equal, round, and reactive to light.  Pulmonary:     Effort: Pulmonary effort is normal. No respiratory distress.     Comments: Speaking in full sentences Musculoskeletal:        General: Normal range of motion.     Cervical back: Normal range of motion.  Skin:    Coloration: Skin is not jaundiced or pale.     Findings: No bruising, erythema, lesion or rash.  Neurological:     Mental Status: She is alert and oriented to person, place, and time. Mental status is at baseline.  Psychiatric:        Mood and Affect: Mood normal.        Behavior: Behavior normal.        Thought Content: Thought content normal.        Judgment: Judgment normal.     Results for orders placed or performed in visit on 06/28/19  Novel Coronavirus, NAA ([facility])   Specimen: Oropharyngeal(OP) collection in vial transport medium   OROPHARYNGEA  TESTING  Result Value Ref Range   SARS-CoV-2, NAA Not Detected Not Detected      Assessment & Plan:   Problem List Items Addressed This Visit      Other   Low back pain    Under good control on current regimen. Continue current regimen. Continue to monitor. Call with any concerns. Refills given. 60 pills should last 3+ months. Call with any concerns. .        Relevant Medications   oxyCODONE-acetaminophen (PERCOCET/ROXICET) 5-325 MG tablet    Other Visit Diagnoses    Concentration deficit    -  Primary   Will start wellbutrin and recheck 1 month. Call with any concerns. Continue to monitor.    Adjustment disorder with depressed mood       Doing better. Now with concetration issues. Will start wellbutrin and recheck 1 month. Call with any concerns.        Follow up plan: Return in about 4 weeks (around 03/28/2020).    . This visit was completed via MyChart due to the restrictions of the COVID-19 pandemic. All issues as above were discussed and addressed. Physical exam was done as above  through visual confirmation on MyChart. If it was felt that the patient should be evaluated in the office, they were directed there. The patient verbally consented to this visit. . Location of the patient: home . Location of the provider: work . Those involved with this call:  . Provider: Park Liter, DO . CMA: Lauretta Grill, RMA . Front Desk/Registration: Don Perking  . Time spent on call: 25 minutes with patient face to face via video conference. More than 50% of this time was spent in counseling and coordination of care. 40 minutes total spent in review of patient's record and preparation of their chart.

## 2020-02-29 NOTE — Assessment & Plan Note (Signed)
Under good control on current regimen. Continue current regimen. Continue to monitor. Call with any concerns. Refills given. 60 pills should last 3+ months. Call with any concerns. Marland Kitchen

## 2020-03-03 ENCOUNTER — Telehealth: Payer: Self-pay

## 2020-03-03 NOTE — Telephone Encounter (Signed)
Prior Authorization initiated via CoverMyMeds for Oxycodone Key: [id number]  [place]

## 2020-07-24 ENCOUNTER — Other Ambulatory Visit: Payer: Self-pay | Admitting: Family Medicine

## 2020-07-24 NOTE — Telephone Encounter (Signed)
Requested medication (s) are due for refill today: Yes  Requested medication (s) are on the active medication list: Yes  Last refill:  02/29/20  Future visit scheduled: Yes  Notes to clinic:  See request.    Requested Prescriptions  Pending Prescriptions Disp Refills   oxyCODONE-acetaminophen (PERCOCET/ROXICET) 5-325 MG tablet 60 tablet 0    Sig: Take 1 tablet by mouth 2 (two) times daily as needed for severe pain.      Not Delegated - Analgesics:  Opioid Agonist Combinations Failed - 07/24/2020 10:06 AM      Failed - This refill cannot be delegated      Failed - Urine Drug Screen completed in last 360 days      Passed - Valid encounter within last 6 months    Recent Outpatient Visits           4 months ago Concentration deficit   [facility], Megan P, DO   8 months ago Chronic midline low back pain with right-sided sciatica   [place], Megan P, DO   11 months ago [place], Volin, DO   1 year ago Adjustment disorder with depressed mood   [place], DO   1 year ago Routine general medical examination at a health care facility   [place], Barb Merino, DO       Future Appointments             In 2 weeks Wynetta Emery, Barb Merino, DO [facility]

## 2020-07-24 NOTE — Telephone Encounter (Signed)
Can this be filled ?

## 2020-07-24 NOTE — Telephone Encounter (Signed)
Has not been seen since April 2021, will leave for Dr. Wynetta Emery to review upon her return, suspect will need visit.

## 2020-07-24 NOTE — Telephone Encounter (Signed)
Copied from [place] [phone]. Topic: Quick Communication - Rx Refill/Question >> Jul 24, 2020 10:00 AM Leward Quan A wrote: Medication: oxyCODONE-acetaminophen (PERCOCET/ROXICET) 5-325 MG tablet   Has the patient contacted their pharmacy? Yes.   (Agent: If no, request that the patient contact the pharmacy for the refill.) (Agent: If yes, when and what did the pharmacy advise?)  Preferred Pharmacy (with phone number or street name): [id number] [address], [place]  Phone:  [phone] Fax:  [phone]     Agent: Please be advised that RX refills may take up to 3 business days. We ask that you follow-up with your pharmacy.

## 2020-07-27 NOTE — Telephone Encounter (Signed)
appt

## 2020-07-28 NOTE — Telephone Encounter (Signed)
Pt has scheduled for apt on 08/12/2020

## 2020-08-12 ENCOUNTER — Other Ambulatory Visit: Payer: Self-pay

## 2020-08-12 ENCOUNTER — Ambulatory Visit (INDEPENDENT_AMBULATORY_CARE_PROVIDER_SITE_OTHER): Payer: Managed Care, Other (non HMO) | Admitting: Family Medicine

## 2020-08-12 ENCOUNTER — Encounter: Payer: Self-pay | Admitting: Family Medicine

## 2020-08-12 VITALS — BP 110/69 | HR 89 | Temp 98.7°F | Wt 148.8 lb

## 2020-08-12 DIAGNOSIS — G8929 Other chronic pain: Secondary | ICD-10-CM | POA: Diagnosis not present

## 2020-08-12 DIAGNOSIS — F4321 Adjustment disorder with depressed mood: Secondary | ICD-10-CM | POA: Insufficient documentation

## 2020-08-12 DIAGNOSIS — M5441 Lumbago with sciatica, right side: Secondary | ICD-10-CM

## 2020-08-12 MED ORDER — BUPROPION HCL ER (SR) 150 MG PO TB12: ORAL_TABLET | ORAL | 2 refills | Status: DC

## 2020-08-12 MED ORDER — OXYCODONE-ACETAMINOPHEN 5-325 MG PO TABS: 1.0000 | ORAL_TABLET | Freq: Two times a day (BID) | ORAL | 0 refills | Status: DC | PRN

## 2020-08-12 MED ORDER — CYCLOBENZAPRINE HCL 10 MG PO TABS: 10.0000 mg | ORAL_TABLET | Freq: Every day | ORAL | 1 refills | Status: DC

## 2020-08-12 NOTE — Assessment & Plan Note (Signed)
Would still like to try the wellbutrin. Rx sent to her pharmacy. Call with any concerns. Continue to monitor.

## 2020-08-12 NOTE — Progress Notes (Signed)
BP 110/69   Pulse 89   Temp 98.7 F (37.1 C)   Wt 148 lb 12.8 oz (67.5 kg)   SpO2 100%   BMI 25.95 kg/m    Subjective:    Patient ID: Theresa Roberts, female    DOB: [birth year], 40 y.o.   MRN: [id number]  HPI: Theresa Roberts is a 40 y.o. female  Chief Complaint  Patient presents with  . Depression  . Back Pain   DEPRESSION- doesn't think she ever got the wellbutrin filled Mood status: stable Satisfied with current treatment?: no Symptom severity: moderate  Duration of current treatment : not on anything Psychotherapy/counseling: no  Previous psychiatric medications: none Depressed mood: yes Anxious mood: yes Anhedonia: no Significant weight loss or gain: no Insomnia: no  Fatigue: yes Feelings of worthlessness or guilt: no Impaired concentration/indecisiveness: no Suicidal ideations: no Hopelessness: no Crying spells: no Depression screen [facility] 2/9 08/12/2020 02/29/2020 07/18/2019 05/18/2019 12/02/2017  Decreased Interest 1 1 3  0 0  Down, Depressed, Hopeless 0 1 2 0 0  PHQ - 2 Score 1 2 5  0 0  Altered sleeping 1 1 3 1 2   Tired, decreased energy 1 1 0 0 2  Change in appetite 1 0 3 0 0  Feeling bad or failure about yourself  0 0 3 0 0  Trouble concentrating 3 1 3  0 1  Moving slowly or fidgety/restless 0 0 0 0 2  Suicidal thoughts 0 0 0 0 0  PHQ-9 Score 7 5 17 1 7   Difficult doing work/chores Extremely dIfficult Not difficult at all Extremely dIfficult Not difficult at all -   CHRONIC PAIN-stopped the gabapentin because it was making her feel drunk and didn't feel like it was really helping  Present dose: 15 Morphine equivalents Pain control status: stable Duration: chronic Location: low back Quality: aching and sore Current Pain Level: moderate Previous Pain Level: moderate Breakthrough pain: no Benefit from narcotic medications: yes What Activities task can be accomplished with current medication? Interested in weaning off narcotics:no   Stool  softners/OTC fiber: no  Previous pain specialty evaluation: no Non-narcotic analgesic meds: yes Narcotic contract: yes  Relevant past medical, surgical, family and social history reviewed and updated as indicated. Interim medical history since our last visit reviewed. Allergies and medications reviewed and updated.  Review of Systems  Constitutional: Negative.   Respiratory: Negative.   Cardiovascular: Negative.   Gastrointestinal: Negative.   Musculoskeletal: Positive for back pain, gait problem and myalgias. Negative for arthralgias, joint swelling, neck pain and neck stiffness.  Skin: Negative.   Psychiatric/Behavioral: Positive for decreased concentration and dysphoric mood. Negative for agitation, behavioral problems, confusion, hallucinations, self-injury, sleep disturbance and suicidal ideas. The patient is nervous/anxious. The patient is not hyperactive.     Per HPI unless specifically indicated above     Objective:    BP 110/69   Pulse 89   Temp 98.7 F (37.1 C)   Wt 148 lb 12.8 oz (67.5 kg)   SpO2 100%   BMI 25.95 kg/m   Wt Readings from Last 3 Encounters:  08/12/20 148 lb 12.8 oz (67.5 kg)  11/23/19 143 lb 6.4 oz (65 kg)  05/18/19 164 lb (74.4 kg)    Physical Exam Vitals and nursing note reviewed.  Constitutional:      General: She is not in acute distress.    Appearance: Normal appearance. She is not ill-appearing, toxic-appearing or diaphoretic.  HENT:     Head: Normocephalic and atraumatic.  Right Ear: External ear normal.     Left Ear: External ear normal.     Nose: Nose normal.     Mouth/Throat:     Mouth: Mucous membranes are moist.     Pharynx: Oropharynx is clear.  Eyes:     General: No scleral icterus.       Right eye: No discharge.        Left eye: No discharge.     Extraocular Movements: Extraocular movements intact.     Conjunctiva/sclera: Conjunctivae normal.     Pupils: Pupils are equal, round, and reactive to light.  Cardiovascular:      Rate and Rhythm: Normal rate and regular rhythm.     Pulses: Normal pulses.     Heart sounds: Normal heart sounds. No murmur heard. No friction rub. No gallop.   Pulmonary:     Effort: Pulmonary effort is normal. No respiratory distress.     Breath sounds: Normal breath sounds. No stridor. No wheezing, rhonchi or rales.  Chest:     Chest wall: No tenderness.  Musculoskeletal:        General: Normal range of motion.     Cervical back: Normal range of motion and neck supple.  Skin:    General: Skin is warm and dry.     Capillary Refill: Capillary refill takes less than 2 seconds.     Coloration: Skin is not jaundiced or pale.     Findings: No bruising, erythema, lesion or rash.  Neurological:     General: No focal deficit present.     Mental Status: She is alert and oriented to person, place, and time. Mental status is at baseline.  Psychiatric:        Mood and Affect: Mood normal.        Behavior: Behavior normal.        Thought Content: Thought content normal.        Judgment: Judgment normal.     Results for orders placed or performed in visit on 06/28/19  Novel Coronavirus, NAA ([facility])   Specimen: Oropharyngeal(OP) collection in vial transport medium   OROPHARYNGEA  TESTING  Result Value Ref Range   SARS-CoV-2, NAA Not Detected Not Detected      Assessment & Plan:   Problem List Items Addressed This Visit      Nervous and Auditory   Chronic midline low back pain with right-sided sciatica    Under good control on current regimen. Continue current regimen. Continue to monitor. Call with any concerns. Refills given. 60 pills should last 3-6 months. Call with any concerns.         Relevant Medications   buPROPion (WELLBUTRIN SR) 150 MG 12 hr tablet   cyclobenzaprine (FLEXERIL) 10 MG tablet   oxyCODONE-acetaminophen (PERCOCET/ROXICET) 5-325 MG tablet     Other   Adjustment disorder with depressed mood - Primary    Would still like to try the wellbutrin. Rx  sent to her pharmacy. Call with any concerns. Continue to monitor.           Follow up plan: Return 1-3 months, for physical.

## 2020-08-12 NOTE — Assessment & Plan Note (Signed)
Under good control on current regimen. Continue current regimen. Continue to monitor. Call with any concerns. Refills given. 60 pills should last 3-6 months. Call with any concerns.

## 2020-10-17 ENCOUNTER — Ambulatory Visit (INDEPENDENT_AMBULATORY_CARE_PROVIDER_SITE_OTHER): Payer: Managed Care, Other (non HMO) | Admitting: Family Medicine

## 2020-10-17 ENCOUNTER — Other Ambulatory Visit: Payer: Self-pay

## 2020-10-17 ENCOUNTER — Encounter: Payer: Self-pay | Admitting: Family Medicine

## 2020-10-17 VITALS — BP 103/70 | HR 82 | Temp 98.2°F | Ht 64.0 in | Wt 150.0 lb

## 2020-10-17 DIAGNOSIS — Z0000 Encounter for general adult medical examination without abnormal findings: Secondary | ICD-10-CM

## 2020-10-17 DIAGNOSIS — N898 Other specified noninflammatory disorders of vagina: Secondary | ICD-10-CM | POA: Diagnosis not present

## 2020-10-17 DIAGNOSIS — Z79899 Other long term (current) drug therapy: Secondary | ICD-10-CM | POA: Diagnosis not present

## 2020-10-17 LAB — URINALYSIS, ROUTINE W REFLEX MICROSCOPIC
Bilirubin, UA: NEGATIVE
Glucose, UA: NEGATIVE
Ketones, UA: NEGATIVE
Leukocytes,UA: NEGATIVE
Nitrite, UA: NEGATIVE
Protein,UA: NEGATIVE
Specific Gravity, UA: 1.01 (ref 1.005–1.030)
Urobilinogen, Ur: 0.2 mg/dL (ref 0.2–1.0)
pH, UA: 6 (ref 5.0–7.5)

## 2020-10-17 LAB — MICROSCOPIC EXAMINATION
Bacteria, UA: NONE SEEN
WBC, UA: NONE SEEN /hpf (ref 0–5)

## 2020-10-17 LAB — WET PREP FOR TRICH, YEAST, CLUE
Clue Cell Exam: POSITIVE — AB
Trichomonas Exam: NEGATIVE
Yeast Exam: NEGATIVE

## 2020-10-17 MED ORDER — ACYCLOVIR 800 MG PO TABS: 800.0000 mg | ORAL_TABLET | Freq: Two times a day (BID) | ORAL | 12 refills | Status: DC

## 2020-10-17 MED ORDER — METRONIDAZOLE 500 MG PO TABS: 500.0000 mg | ORAL_TABLET | Freq: Two times a day (BID) | ORAL | 0 refills | Status: DC

## 2020-10-17 NOTE — Patient Instructions (Signed)
Health Maintenance, Female Adopting a healthy lifestyle and getting preventive care are important in promoting health and wellness. Ask your health care provider about:  The right schedule for you to have regular tests and exams.  Things you can do on your own to prevent diseases and keep yourself healthy. What should I know about diet, weight, and exercise? Eat a healthy diet  Eat a diet that includes plenty of vegetables, fruits, low-fat dairy products, and lean protein.  Do not eat a lot of foods that are high in solid fats, added sugars, or sodium.   Maintain a healthy weight Body mass index (BMI) is used to identify weight problems. It estimates body fat based on height and weight. Your health care provider can help determine your BMI and help you achieve or maintain a healthy weight. Get regular exercise Get regular exercise. This is one of the most important things you can do for your health. Most adults should:  Exercise for at least 150 minutes each week. The exercise should increase your heart rate and make you sweat (moderate-intensity exercise).  Do strengthening exercises at least twice a week. This is in addition to the moderate-intensity exercise.  Spend less time sitting. Even light physical activity can be beneficial. Watch cholesterol and blood lipids Have your blood tested for lipids and cholesterol at 38 years of age, then have this test every 5 years. Have your cholesterol levels checked more often if:  Your lipid or cholesterol levels are high.  You are older than 38 years of age.  You are at high risk for heart disease. What should I know about cancer screening? Depending on your health history and family history, you may need to have cancer screening at various ages. This may include screening for:  Breast cancer.  Cervical cancer.  Colorectal cancer.  Skin cancer.  Lung cancer. What should I know about heart disease, diabetes, and high blood  pressure? Blood pressure and heart disease  High blood pressure causes heart disease and increases the risk of stroke. This is more likely to develop in people who have high blood pressure readings, are of African descent, or are overweight.  Have your blood pressure checked: ? Every 3-5 years if you are 18-39 years of age. ? Every year if you are 40 years old or older. Diabetes Have regular diabetes screenings. This checks your fasting blood sugar level. Have the screening done:  Once every three years after age 40 if you are at a normal weight and have a low risk for diabetes.  More often and at a younger age if you are overweight or have a high risk for diabetes. What should I know about preventing infection? Hepatitis B If you have a higher risk for hepatitis B, you should be screened for this virus. Talk with your health care provider to find out if you are at risk for hepatitis B infection. Hepatitis C Testing is recommended for:  Everyone born from 1945 through 1965.  Anyone with known risk factors for hepatitis C. Sexually transmitted infections (STIs)  Get screened for STIs, including gonorrhea and chlamydia, if: ? You are sexually active and are younger than 38 years of age. ? You are older than 38 years of age and your health care provider tells you that you are at risk for this type of infection. ? Your sexual activity has changed since you were last screened, and you are at increased risk for chlamydia or gonorrhea. Ask your health care provider   if you are at risk.  Ask your health care provider about whether you are at high risk for HIV. Your health care provider may recommend a prescription medicine to help prevent HIV infection. If you choose to take medicine to prevent HIV, you should first get tested for HIV. You should then be tested every 3 months for as long as you are taking the medicine. Pregnancy  If you are about to stop having your period (premenopausal) and  you may become pregnant, seek counseling before you get pregnant.  Take 400 to 800 micrograms (mcg) of folic acid every day if you become pregnant.  Ask for birth control (contraception) if you want to prevent pregnancy. Osteoporosis and menopause Osteoporosis is a disease in which the bones lose minerals and strength with aging. This can result in bone fractures. If you are 65 years old or older, or if you are at risk for osteoporosis and fractures, ask your health care provider if you should:  Be screened for bone loss.  Take a calcium or vitamin D supplement to lower your risk of fractures.  Be given hormone replacement therapy (HRT) to treat symptoms of menopause. Follow these instructions at home: Lifestyle  Do not use any products that contain nicotine or tobacco, such as cigarettes, e-cigarettes, and chewing tobacco. If you need help quitting, ask your health care provider.  Do not use street drugs.  Do not share needles.  Ask your health care provider for help if you need support or information about quitting drugs. Alcohol use  Do not drink alcohol if: ? Your health care provider tells you not to drink. ? You are pregnant, may be pregnant, or are planning to become pregnant.  If you drink alcohol: ? Limit how much you use to 0-1 drink a day. ? Limit intake if you are breastfeeding.  Be aware of how much alcohol is in your drink. In the [place] one drink equals one 12 oz bottle of beer (355 mL), one 5 oz glass of wine (148 mL), or one 1 oz glass of hard liquor (44 mL). General instructions  Schedule regular health, dental, and eye exams.  Stay current with your vaccines.  Tell your health care provider if: ? You often feel depressed. ? You have ever been abused or do not feel safe at home. Summary  Adopting a healthy lifestyle and getting preventive care are important in promoting health and wellness.  Follow your health care provider's instructions about healthy  diet, exercising, and getting tested or screened for diseases.  Follow your health care provider's instructions on monitoring your cholesterol and blood pressure. This information is not intended to replace advice given to you by your health care provider. Make sure you discuss any questions you have with your health care provider. Document Revised: 07/19/2018 Document Reviewed: 07/19/2018 Elsevier Patient Education  2021 [employer]  

## 2020-10-17 NOTE — Progress Notes (Signed)
BP 103/70   Pulse 82   Temp 98.2 F (36.8 C)   Ht 5\' 4"  (1.626 m)   Wt 150 lb (68 kg)   SpO2 98%   BMI 25.75 kg/m    Subjective:    Patient ID: Theresa Roberts, female    DOB: 1981/07/04, 40 y.o.   MRN: [id number]  HPI: Theresa Roberts is a 40 y.o. female presenting on 10/17/2020 for comprehensive medical examination. Current medical complaints include:  Periods have been a little longer and have been heavier with heavier cramps.   She currently lives with: kids Menopausal Symptoms: no  Depression Screen done today and results listed below:  Depression screen [facility] 2/9 08/12/2020 02/29/2020 07/18/2019 05/18/2019 12/02/2017  Decreased Interest 1 1 3  0 0  Down, Depressed, Hopeless 0 1 2 0 0  PHQ - 2 Score 1 2 5  0 0  Altered sleeping 1 1 3 1 2   Tired, decreased energy 1 1 0 0 2  Change in appetite 1 0 3 0 0  Feeling bad or failure about yourself  0 0 3 0 0  Trouble concentrating 3 1 3  0 1  Moving slowly or fidgety/restless 0 0 0 0 2  Suicidal thoughts 0 0 0 0 0  PHQ-9 Score 7 5 17 1 7   Difficult doing work/chores Extremely dIfficult Not difficult at all Extremely dIfficult Not difficult at all -    Past Medical History:  Past Medical History:  Diagnosis Date  . Scoliosis     Surgical History:  Past Surgical History:  Procedure Laterality Date  . CHOLECYSTECTOMY    . PLACEMENT OF BREAST IMPLANTS      Medications:  Current Outpatient Medications on File Prior to Visit  Medication Sig  . acetaminophen (TYLENOL) 650 MG CR tablet Take 650 mg by mouth every 8 (eight) hours as needed for pain.  Marland Kitchen acyclovir (ZOVIRAX) 800 MG tablet Take 1 tablet (800 mg total) by mouth 2 (two) times daily.  . cyclobenzaprine (FLEXERIL) 10 MG tablet Take 1 tablet (10 mg total) by mouth at bedtime.  Marland Kitchen oxyCODONE-acetaminophen (PERCOCET/ROXICET) 5-325 MG tablet Take 1 tablet by mouth 2 (two) times daily as needed for severe pain.   Current Facility-Administered Medications on File Prior to  Visit  Medication  . etonogestrel (NEXPLANON) implant 68 mg    Allergies:  No Known Allergies  Social History:  Social History   Socioeconomic History  . Marital status: Single    Spouse name: Not on file  . Number of children: Not on file  . Years of education: Not on file  . Highest education level: Not on file  Occupational History  . Not on file  Tobacco Use  . Smoking status: Never Smoker  . Smokeless tobacco: Never Used  Vaping Use  . Vaping Use: Never used  Substance and Sexual Activity  . Alcohol use: No    Alcohol/week: 0.0 standard drinks  . Drug use: No  . Sexual activity: Yes    Birth control/protection: Implant  Other Topics Concern  . Not on file  Social History Narrative  . Not on file   Social Determinants of Health   Financial Resource Strain: Not on file  Food Insecurity: Not on file  Transportation Needs: Not on file  Physical Activity: Not on file  Stress: Not on file  Social Connections: Not on file  Intimate Partner Violence: Not on file   Social History   Tobacco Use  Smoking Status Never Smoker  Smokeless Tobacco Never Used   Social History   Substance and Sexual Activity  Alcohol Use No  . Alcohol/week: 0.0 standard drinks    Family History:  Family History  Problem Relation Age of Onset  . Asthma Son   . Cancer Maternal Grandmother        breast, malignant  . Heart attack Maternal Grandfather   . Diabetes Maternal Grandfather        type 2    Past medical history, surgical history, medications, allergies, family history and social history reviewed with patient today and changes made to appropriate areas of the chart.   Review of Systems  Constitutional: Negative.   HENT: Negative.   Eyes: Negative.   Respiratory: Negative.   Cardiovascular: Positive for palpitations. Negative for chest pain, orthopnea, claudication, leg swelling and PND.  Gastrointestinal: Positive for vomiting. Negative for abdominal pain, blood  in stool, constipation, diarrhea, heartburn, melena and nausea.  Genitourinary: Negative.   Musculoskeletal: Positive for back pain and myalgias. Negative for falls, joint pain and neck pain.  Skin: Negative.   Neurological: Positive for tingling. Negative for dizziness, tremors, sensory change, speech change, focal weakness, seizures, loss of consciousness, weakness and headaches.  Endo/Heme/Allergies: Positive for environmental allergies. Negative for polydipsia. Does not bruise/bleed easily.  Psychiatric/Behavioral: Negative.    All other ROS negative except what is listed above and in the HPI.      Objective:    BP 103/70   Pulse 82   Temp 98.2 F (36.8 C)   Ht 5\' 4"  (1.626 m)   Wt 150 lb (68 kg)   SpO2 98%   BMI 25.75 kg/m   Wt Readings from Last 3 Encounters:  10/17/20 150 lb (68 kg)  08/12/20 148 lb 12.8 oz (67.5 kg)  11/23/19 143 lb 6.4 oz (65 kg)    Physical Exam Vitals and nursing note reviewed.  Constitutional:      General: She is not in acute distress.    Appearance: Normal appearance. She is not ill-appearing, toxic-appearing or diaphoretic.  HENT:     Head: Normocephalic and atraumatic.     Right Ear: Tympanic membrane, ear canal and external ear normal. There is no impacted cerumen.     Left Ear: Tympanic membrane, ear canal and external ear normal. There is no impacted cerumen.     Nose: Nose normal. No congestion or rhinorrhea.     Mouth/Throat:     Mouth: Mucous membranes are moist.     Pharynx: Oropharynx is clear. No oropharyngeal exudate or posterior oropharyngeal erythema.  Eyes:     General: No scleral icterus.       Right eye: No discharge.        Left eye: No discharge.     Extraocular Movements: Extraocular movements intact.     Conjunctiva/sclera: Conjunctivae normal.     Pupils: Pupils are equal, round, and reactive to light.  Neck:     Vascular: No carotid bruit.  Cardiovascular:     Rate and Rhythm: Normal rate and regular rhythm.      Pulses: Normal pulses.     Heart sounds: No murmur heard. No friction rub. No gallop.   Pulmonary:     Effort: Pulmonary effort is normal. No respiratory distress.     Breath sounds: Normal breath sounds. No stridor. No wheezing, rhonchi or rales.  Chest:     Chest wall: No tenderness.  Abdominal:     General: Abdomen is flat. Bowel sounds are normal.  There is no distension.     Palpations: Abdomen is soft. There is no mass.     Tenderness: There is no abdominal tenderness. There is no right CVA tenderness, left CVA tenderness, guarding or rebound.     Hernia: No hernia is present.  Genitourinary:    Comments: Breast and pelvic exams deferred with shared decision making Musculoskeletal:        General: No swelling, tenderness, deformity or signs of injury.     Cervical back: Normal range of motion and neck supple. No rigidity. No muscular tenderness.     Right lower leg: No edema.     Left lower leg: No edema.  Lymphadenopathy:     Cervical: No cervical adenopathy.  Skin:    General: Skin is warm and dry.     Capillary Refill: Capillary refill takes less than 2 seconds.     Coloration: Skin is not jaundiced or pale.     Findings: No bruising, erythema, lesion or rash.  Neurological:     General: No focal deficit present.     Mental Status: She is alert and oriented to person, place, and time. Mental status is at baseline.     Cranial Nerves: No cranial nerve deficit.     Sensory: No sensory deficit.     Motor: No weakness.     Coordination: Coordination normal.     Gait: Gait normal.     Deep Tendon Reflexes: Reflexes normal.  Psychiatric:        Mood and Affect: Mood normal.        Behavior: Behavior normal.        Thought Content: Thought content normal.        Judgment: Judgment normal.     Results for orders placed or performed in visit on 06/28/19  Novel Coronavirus, NAA ([facility])   Specimen: Oropharyngeal(OP) collection in vial transport medium   OROPHARYNGEA   TESTING  Result Value Ref Range   SARS-CoV-2, NAA Not Detected Not Detected      Assessment & Plan:   Problem List Items Addressed This Visit   None   Visit Diagnoses    Routine general medical examination at a health care facility    -  Primary   Vaccines up to date. Screening labs checked today. Pap up to date. Continue diet and exercise. Call with any concerns.    Relevant Orders   CBC with Differential/Platelet   Comprehensive metabolic panel   Lipid Panel w/o Chol/HDL Ratio   Urinalysis, Routine w reflex microscopic   TSH   Long-term use of high-risk medication       Has not taken her pain medicine recently. Takes it as needed. Checking labs today. Await results.    Relevant Orders   [id number] 11+Oxyco+Alc+Crt-Bund   Vaginal discharge       Checking swab today. Await results.    Relevant Orders   WET PREP FOR [place], YEAST, CLUE       Follow up plan: Return 1-4 months follow up pain.   LABORATORY TESTING:  - Pap smear: up to date  IMMUNIZATIONS:   - Tdap: Tetanus vaccination status reviewed: last tetanus booster within 10 years. - Influenza: Refused - COVID: Refused  PATIENT COUNSELING:   Advised to take 1 mg of folate supplement per day if capable of pregnancy.   Sexuality: Discussed sexually transmitted diseases, partner selection, use of condoms, avoidance of unintended pregnancy  and contraceptive alternatives.   Advised to avoid cigarette smoking.  I discussed with the patient that most people either abstain from alcohol or drink within safe limits (<=14/week and <=4 drinks/occasion for males, <=7/weeks and <= 3 drinks/occasion for females) and that the risk for alcohol disorders and other health effects rises proportionally with the number of drinks per week and how often a drinker exceeds daily limits.  Discussed cessation/primary prevention of drug use and availability of treatment for abuse.   Diet: Encouraged to adjust caloric intake to maintain  or  achieve ideal body weight, to reduce intake of dietary saturated fat and total fat, to limit sodium intake by avoiding high sodium foods and not adding table salt, and to maintain adequate dietary potassium and calcium preferably from fresh fruits, vegetables, and low-fat dairy products.    stressed the importance of regular exercise  Injury prevention: Discussed safety belts, safety helmets, smoke detector, smoking near bedding or upholstery.   Dental health: Discussed importance of regular tooth brushing, flossing, and dental visits.    NEXT PREVENTATIVE PHYSICAL DUE IN 1 YEAR. Return 1-4 months follow up pain.

## 2020-10-18 LAB — COMPREHENSIVE METABOLIC PANEL
ALT: 17 IU/L (ref 0–32)
AST: 22 IU/L (ref 0–40)
Albumin/Globulin Ratio: 1.9 (ref 1.2–2.2)
Albumin: 4.6 g/dL (ref 3.8–4.8)
Alkaline Phosphatase: 55 IU/L (ref 44–121)
BUN/Creatinine Ratio: 15 (ref 9–23)
BUN: 11 mg/dL (ref 6–20)
Bilirubin Total: 0.4 mg/dL (ref 0.0–1.2)
CO2: 21 mmol/L (ref 20–29)
Calcium: 8.9 mg/dL (ref 8.7–10.2)
Chloride: 97 mmol/L (ref 96–106)
Creatinine, Ser: 0.71 mg/dL (ref 0.57–1.00)
Globulin, Total: 2.4 g/dL (ref 1.5–4.5)
Glucose: 84 mg/dL (ref 65–99)
Potassium: 3.9 mmol/L (ref 3.5–5.2)
Sodium: 140 mmol/L (ref 134–144)
Total Protein: 7 g/dL (ref 6.0–8.5)
eGFR: 112 mL/min/{1.73_m2} (ref >59)

## 2020-10-18 LAB — CBC WITH DIFFERENTIAL/PLATELET
Basophils Absolute: 0.1 10*3/uL (ref 0.0–0.2)
Basos: 1 %
EOS (ABSOLUTE): 0.1 10*3/uL (ref 0.0–0.4)
Eos: 1 %
Hematocrit: 38 % (ref 34.0–46.6)
Hemoglobin: 13 g/dL (ref 11.1–15.9)
Immature Grans (Abs): 0 10*3/uL (ref 0.0–0.1)
Immature Granulocytes: 0 %
Lymphocytes Absolute: 1.9 10*3/uL (ref 0.7–3.1)
Lymphs: 20 %
MCH: 31.9 pg (ref 26.6–33.0)
MCHC: 34.2 g/dL (ref 31.5–35.7)
MCV: 93 fL (ref 79–97)
Monocytes Absolute: 0.8 10*3/uL (ref 0.1–0.9)
Monocytes: 8 %
Neutrophils Absolute: 6.4 10*3/uL (ref 1.4–7.0)
Neutrophils: 70 %
Platelets: 302 10*3/uL (ref 150–450)
RBC: 4.07 x10E6/uL (ref 3.77–5.28)
RDW: 11.8 % (ref 11.7–15.4)
WBC: 9.3 10*3/uL (ref 3.4–10.8)

## 2020-10-18 LAB — DRUG SCREEN 764883 11+OXYCO+ALC+CRT-BUND
Amphetamines, Urine: NEGATIVE ng/mL
BENZODIAZ UR QL: NEGATIVE ng/mL
Barbiturate: NEGATIVE ng/mL
Cannabinoid Quant, Ur: NEGATIVE ng/mL
Cocaine (Metabolite): NEGATIVE ng/mL
Creatinine: 65.2 mg/dL (ref 20.0–300.0)
Ethanol: NEGATIVE %
Meperidine: NEGATIVE ng/mL
Methadone Screen, Urine: NEGATIVE ng/mL
OPIATE SCREEN URINE: NEGATIVE ng/mL
Oxycodone/Oxymorphone, Urine: NEGATIVE ng/mL
Phencyclidine: NEGATIVE ng/mL
Propoxyphene: NEGATIVE ng/mL
Tramadol: NEGATIVE ng/mL
pH, Urine: 5.8 (ref 4.5–8.9)

## 2020-10-18 LAB — LIPID PANEL W/O CHOL/HDL RATIO
Cholesterol, Total: 171 mg/dL (ref 100–199)
HDL: 80 mg/dL (ref >39)
LDL Chol Calc (NIH): 77 mg/dL (ref 0–99)
Triglycerides: 74 mg/dL (ref 0–149)
VLDL Cholesterol Cal: 14 mg/dL (ref 5–40)

## 2020-10-18 LAB — TSH: TSH: 0.922 u[IU]/mL (ref 0.450–4.500)

## 2020-12-19 ENCOUNTER — Ambulatory Visit: Payer: Managed Care, Other (non HMO) | Admitting: Family Medicine

## 2020-12-22 ENCOUNTER — Encounter: Payer: Self-pay | Admitting: Family Medicine

## 2020-12-22 ENCOUNTER — Ambulatory Visit: Payer: Managed Care, Other (non HMO) | Admitting: Family Medicine

## 2020-12-22 ENCOUNTER — Other Ambulatory Visit: Payer: Self-pay

## 2020-12-22 VITALS — BP 100/66 | HR 73 | Temp 99.6°F | Wt 151.2 lb

## 2020-12-22 DIAGNOSIS — G8929 Other chronic pain: Secondary | ICD-10-CM

## 2020-12-22 DIAGNOSIS — F4321 Adjustment disorder with depressed mood: Secondary | ICD-10-CM | POA: Diagnosis not present

## 2020-12-22 DIAGNOSIS — M5441 Lumbago with sciatica, right side: Secondary | ICD-10-CM

## 2020-12-22 MED ORDER — CYCLOBENZAPRINE HCL 10 MG PO TABS: 10.0000 mg | ORAL_TABLET | Freq: Every day | ORAL | 1 refills | Status: DC

## 2020-12-22 MED ORDER — OXYCODONE-ACETAMINOPHEN 5-325 MG PO TABS: 1.0000 | ORAL_TABLET | Freq: Two times a day (BID) | ORAL | 0 refills | Status: DC | PRN

## 2020-12-22 MED ORDER — BUPROPION HCL ER (XL) 150 MG PO TB24: 150.0000 mg | ORAL_TABLET | Freq: Every day | ORAL | 3 refills | Status: DC

## 2020-12-22 NOTE — Progress Notes (Signed)
BP 100/66   Pulse 73   Temp 99.6 F (37.6 C)   Wt 151 lb 3.2 oz (68.6 kg)   SpO2 96%   BMI 25.95 kg/m    Subjective:    Patient ID: Theresa Roberts, female    DOB: 01-Mar-1981, 40 y.o.   MRN: [id number]  HPI: Theresa Roberts is a 40 y.o. female  Chief Complaint  Patient presents with  . Back Pain    Follow up on back pain    CHRONIC PAIN  Present dose: 15 Morphine equivalents Pain control status: controlled Duration: chronic Location: low back Quality: aching and sore Current Pain Level: mild Previous Pain Level: moderate Breakthrough pain: no Benefit from narcotic medications: yes What Activities task can be accomplished with current medication? Able to do her daily activities Interested in weaning off narcotics:no   Stool softners/OTC fiber: yes  Previous pain specialty evaluation: yes Non-narcotic analgesic meds: yes Narcotic contract: yes  ANXIETY/STRESS Duration:uncontrolled Anxious mood: yes  Excessive worrying: yes Irritability: no  Sweating: no Nausea: no Palpitations:no Hyperventilation: no Panic attacks: no Agoraphobia: no  Obscessions/compulsions: no Depressed mood: no Depression screen [facility] 2/9 08/12/2020 02/29/2020 07/18/2019 05/18/2019 12/02/2017  Decreased Interest _0 0 0  Down, Depressed, Hopeless 0 1 2 0 0  PHQ - 2 Score _1 0 0  Altered sleeping _2 Tired, decreased energy 1 1 0 0 2  Change in appetite 1 0 3 0 0  Feeling bad or failure about yourself  0 0 3 0 0  Trouble concentrating _3 0 1  Moving slowly or fidgety/restless 0 0 0 0 2  Suicidal thoughts 0 0 0 0 0  PHQ-9 Score _4 Difficult doing work/chores Extremely dIfficult Not difficult at all Extremely dIfficult Not difficult at all -   Anhedonia: no Weight changes: no Insomnia: no   Hypersomnia: no Fatigue/loss of energy: no Feelings of worthlessness: no Feelings of guilt: no Impaired concentration/indecisiveness: yes Suicidal ideations: no  Crying  spells: no Recent Stressors/Life Changes: yes   Relationship problems: no   Family stress: no     Financial stress: no    Job stress: no    Recent death/loss: no   Relevant past medical, surgical, family and social history reviewed and updated as indicated. Interim medical history since our last visit reviewed. Allergies and medications reviewed and updated.  Review of Systems  Constitutional: Negative.   Respiratory: Negative.   Cardiovascular: Negative.   Gastrointestinal: Negative.   Musculoskeletal: Positive for arthralgias, back pain and myalgias. Negative for gait problem, joint swelling, neck pain and neck stiffness.  Skin: Negative.   Neurological: Negative.   Psychiatric/Behavioral: Negative.     Per HPI unless specifically indicated above     Objective:    BP 100/66   Pulse 73   Temp 99.6 F (37.6 C)   Wt 151 lb 3.2 oz (68.6 kg)   SpO2 96%   BMI 25.95 kg/m   Wt Readings from Last 3 Encounters:  12/22/20 151 lb 3.2 oz (68.6 kg)  10/17/20 150 lb (68 kg)  08/12/20 148 lb 12.8 oz (67.5 kg)    Physical Exam Vitals and nursing note reviewed.  Constitutional:      General: She is not in acute distress.    Appearance: Normal appearance. She is not ill-appearing, toxic-appearing or diaphoretic.  HENT:     Head: Normocephalic and atraumatic.  Right Ear: External ear normal.     Left Ear: External ear normal.     Nose: Nose normal.     Mouth/Throat:     Mouth: Mucous membranes are moist.     Pharynx: Oropharynx is clear.  Eyes:     General: No scleral icterus.       Right eye: No discharge.        Left eye: No discharge.     Extraocular Movements: Extraocular movements intact.     Conjunctiva/sclera: Conjunctivae normal.     Pupils: Pupils are equal, round, and reactive to light.  Cardiovascular:     Rate and Rhythm: Normal rate and regular rhythm.     Pulses: Normal pulses.     Heart sounds: Normal heart sounds. No murmur heard. No friction rub. No  gallop.   Pulmonary:     Effort: Pulmonary effort is normal. No respiratory distress.     Breath sounds: Normal breath sounds. No stridor. No wheezing, rhonchi or rales.  Chest:     Chest wall: No tenderness.  Musculoskeletal:        General: Normal range of motion.     Cervical back: Normal range of motion and neck supple.  Skin:    General: Skin is warm and dry.     Capillary Refill: Capillary refill takes less than 2 seconds.     Coloration: Skin is not jaundiced or pale.     Findings: No bruising, erythema, lesion or rash.  Neurological:     General: No focal deficit present.     Mental Status: She is alert and oriented to person, place, and time. Mental status is at baseline.  Psychiatric:        Mood and Affect: Mood normal.        Behavior: Behavior normal.        Thought Content: Thought content normal.        Judgment: Judgment normal.     Results for orders placed or performed in visit on 10/17/20  WET PREP FOR [place], YEAST, CLUE   Specimen: Sterile Swab   Sterile Swab  Result Value Ref Range   Trichomonas Exam Negative Negative   Yeast Exam Negative Negative   Clue Cell Exam Positive (A) Negative  Microscopic Examination   Urine  Result Value Ref Range   WBC, UA None seen 0 - 5 /hpf   RBC 3-10 (A) 0 - 2 /hpf   Epithelial Cells (non renal) 0-10 0 - 10 /hpf   Bacteria, UA None seen None seen/Few  CBC with Differential/Platelet  Result Value Ref Range   WBC 9.3 3.4 - 10.8 x10E3/uL   RBC 4.07 3.77 - 5.28 x10E6/uL   Hemoglobin 13.0 11.1 - 15.9 g/dL   Hematocrit 38.0 34.0 - 46.6 %   MCV 93 79 - 97 fL   MCH 31.9 26.6 - 33.0 pg   MCHC 34.2 31.5 - 35.7 g/dL   RDW 11.8 11.7 - 15.4 %   Platelets 302 150 - 450 x10E3/uL   Neutrophils 70 Not Estab. %   Lymphs 20 Not Estab. %   Monocytes 8 Not Estab. %   Eos 1 Not Estab. %   Basos 1 Not Estab. %   Neutrophils Absolute 6.4 1.4 - 7.0 x10E3/uL   Lymphocytes Absolute 1.9 0.7 - 3.1 x10E3/uL   Monocytes Absolute 0.8  0.1 - 0.9 x10E3/uL   EOS (ABSOLUTE) 0.1 0.0 - 0.4 x10E3/uL   Basophils Absolute 0.1 0.0 - 0.2 x10E3/uL  Immature Granulocytes 0 Not Estab. %   Immature Grans (Abs) 0.0 0.0 - 0.1 x10E3/uL  Comprehensive metabolic panel  Result Value Ref Range   Glucose 84 65 - 99 mg/dL   BUN 11 6 - 20 mg/dL   Creatinine, Ser 0.71 0.57 - 1.00 mg/dL   eGFR 112 >59 mL/min/1.73   BUN/Creatinine Ratio 15 9 - 23   Sodium 140 134 - 144 mmol/L   Potassium 3.9 3.5 - 5.2 mmol/L   Chloride 97 96 - 106 mmol/L   CO2 21 20 - 29 mmol/L   Calcium 8.9 8.7 - 10.2 mg/dL   Total Protein 7.0 6.0 - 8.5 g/dL   Albumin 4.6 3.8 - 4.8 g/dL   Globulin, Total 2.4 1.5 - 4.5 g/dL   Albumin/Globulin Ratio 1.9 1.2 - 2.2   Bilirubin Total 0.4 0.0 - 1.2 mg/dL   Alkaline Phosphatase 55 44 - 121 IU/L   AST 22 0 - 40 IU/L   ALT 17 0 - 32 IU/L  Lipid Panel w/o Chol/HDL Ratio  Result Value Ref Range   Cholesterol, Total 171 100 - 199 mg/dL   Triglycerides 74 0 - 149 mg/dL   HDL 80 >39 mg/dL   VLDL Cholesterol Cal 14 5 - 40 mg/dL   LDL Chol Calc (NIH) 77 0 - 99 mg/dL  Urinalysis, Routine w reflex microscopic  Result Value Ref Range   Specific Gravity, UA 1.010 1.005 - 1.030   pH, UA 6.0 5.0 - 7.5   Color, UA Yellow Yellow   Appearance Ur Clear Clear   Leukocytes,UA Negative Negative   Protein,UA Negative Negative/Trace   Glucose, UA Negative Negative   Ketones, UA Negative Negative   RBC, UA 1+ (A) Negative   Bilirubin, UA Negative Negative   Urobilinogen, Ur 0.2 0.2 - 1.0 mg/dL   Nitrite, UA Negative Negative   Microscopic Examination See below:   TSH  Result Value Ref Range   TSH 0.922 0.450 - 4.500 uIU/mL  [id number] 11+Oxyco+Alc+Crt-Bund  Result Value Ref Range   Ethanol Negative Cutoff=0.020 %   Amphetamines, Urine Negative Cutoff=1000 ng/mL   Barbiturate Negative Cutoff=200 ng/mL   BENZODIAZ UR QL Negative Cutoff=200 ng/mL   Cannabinoid Quant, Ur Negative Cutoff=50 ng/mL   Cocaine (Metabolite) Negative Cutoff=300  ng/mL   OPIATE SCREEN URINE Negative Cutoff=300 ng/mL   Oxycodone/Oxymorphone, Urine Negative Cutoff=300 ng/mL   Phencyclidine Negative Cutoff=25 ng/mL   Methadone Screen, Urine Negative Cutoff=300 ng/mL   Propoxyphene Negative Cutoff=300 ng/mL   Meperidine Negative Cutoff=200 ng/mL   Tramadol Negative Cutoff=200 ng/mL   Creatinine 65.2 20.0 - 300.0 mg/dL   pH, Urine 5.8 4.5 - 8.9      Assessment & Plan:   Problem List Items Addressed This Visit      Nervous and Auditory   Chronic midline low back pain with right-sided sciatica    Under good control on current regimen. Continue current regimen. Continue to monitor. Call with any concerns. Refills given. 60 pills should last 3-4 months. Follow up 1-4 months.         Relevant Medications   oxyCODONE-acetaminophen (PERCOCET/ROXICET) 5-325 MG tablet   cyclobenzaprine (FLEXERIL) 10 MG tablet   buPROPion (WELLBUTRIN XL) 150 MG 24 hr tablet     Other   Adjustment disorder with depressed mood - Primary    Never received her wellbutrin. Rx sent to her pharmacy. Recheck at follow up.          Follow up plan: Return 1-4 months.

## 2020-12-22 NOTE — Assessment & Plan Note (Signed)
Never received her wellbutrin. Rx sent to her pharmacy. Recheck at follow up.

## 2020-12-22 NOTE — Assessment & Plan Note (Signed)
Under good control on current regimen. Continue current regimen. Continue to monitor. Call with any concerns. Refills given. 60 pills should last 3-4 months. Follow up 1-4 months.

## 2021-01-26 ENCOUNTER — Other Ambulatory Visit: Payer: Self-pay | Admitting: Physician Assistant

## 2021-01-26 DIAGNOSIS — M25562 Pain in left knee: Secondary | ICD-10-CM

## 2021-01-29 ENCOUNTER — Ambulatory Visit: Admission: RE | Admit: 2021-01-29 | Payer: Managed Care, Other (non HMO) | Source: Ambulatory Visit

## 2021-02-04 ENCOUNTER — Ambulatory Visit: Payer: Managed Care, Other (non HMO)

## 2021-03-19 ENCOUNTER — Encounter: Payer: Self-pay | Admitting: Family Medicine

## 2021-03-19 ENCOUNTER — Ambulatory Visit: Payer: Managed Care, Other (non HMO) | Admitting: Family Medicine

## 2021-03-19 ENCOUNTER — Other Ambulatory Visit: Payer: Self-pay

## 2021-03-19 VITALS — BP 138/69 | HR 91 | Temp 98.2°F | Wt 150.0 lb

## 2021-03-19 DIAGNOSIS — M25562 Pain in left knee: Secondary | ICD-10-CM | POA: Diagnosis not present

## 2021-03-19 DIAGNOSIS — F4321 Adjustment disorder with depressed mood: Secondary | ICD-10-CM

## 2021-03-19 DIAGNOSIS — M5441 Lumbago with sciatica, right side: Secondary | ICD-10-CM | POA: Diagnosis not present

## 2021-03-19 DIAGNOSIS — G8929 Other chronic pain: Secondary | ICD-10-CM | POA: Diagnosis not present

## 2021-03-19 NOTE — Assessment & Plan Note (Signed)
Did not tolerate the wellbutrin. Has not totally been feeling like herself. Will stay off medicine and check in a month to see how she is. If need be will start low dose prozac.

## 2021-03-19 NOTE — Assessment & Plan Note (Signed)
Under good control on current regimen. Continue current regimen. Continue to monitor. Call with any concerns. Refills given. Rx should last 3+ months. Follow up 3 months.   
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
## 2021-03-31 ENCOUNTER — Other Ambulatory Visit: Payer: Self-pay | Admitting: Family Medicine

## 2021-03-31 MED ORDER — OXYCODONE-ACETAMINOPHEN 5-325 MG PO TABS: 1.0000 | ORAL_TABLET | Freq: Two times a day (BID) | ORAL | 0 refills | Status: DC | PRN

## 2021-03-31 NOTE — Telephone Encounter (Signed)
PDMP reviewed and last fill 12/22/20, although did have 6 tablets of Tramadol filled in June.  On review Dr. Wynetta Emery 03/19/21 note it appears refills were to be sent, but do not see these.  Will send today and is to last her 3 months.

## 2021-03-31 NOTE — Telephone Encounter (Signed)
Medication Refill - Medication: oxyCODONE-acetaminophen (PERCOCET/ROXICET) 5-325 MG tablet  Almost out   Has the patient contacted their pharmacy? Yes.   (Agent: If no, request that the patient contact the pharmacy for the refill.) (Agent: If yes, when and what did the pharmacy advise?)  Preferred Pharmacy (with phone number or street name):  [facility] [id number], [place] [address]  [place] [address]  Phone: [phone] Fax: [phone]    Agent: Please be advised that RX refills may take up to 3 business days. We ask that you follow-up with your pharmacy.

## 2021-03-31 NOTE — Telephone Encounter (Signed)
Patient last seen 03/19/21, medication last filled 12/22/20

## 2021-03-31 NOTE — Telephone Encounter (Signed)
Requested medication (s) are due for refill today: yes  Requested medication (s) are on the active medication list: yes  Last refill:  12/22/20  Future visit scheduled: yes  Notes to clinic:  med refill not delegated to NT to RF   Requested Prescriptions  Pending Prescriptions Disp Refills   oxyCODONE-acetaminophen (PERCOCET/ROXICET) 5-325 MG tablet 60 tablet 0    Sig: Take 1 tablet by mouth 2 (two) times daily as needed for severe pain.     Not Delegated - Analgesics:  Opioid Agonist Combinations Failed - 03/31/2021  4:42 PM      Failed - This refill cannot be delegated      Passed - Urine Drug Screen completed in last 360 days      Passed - Valid encounter within last 6 months    Recent Outpatient Visits           1 week ago Acute pain of left knee   [place], Megan P, DO   3 months ago Adjustment disorder with depressed mood   [place], Megan P, DO   5 months ago Routine general medical examination at a health care facility   [facility], [place] P, DO   7 months ago Adjustment disorder with depressed mood   [place], [place], DO   1 year ago Concentration deficit   [place], [place], DO       Future Appointments             In 2 months Wynetta Emery, Barb Merino, DO [employer]

## 2021-04-19 ENCOUNTER — Encounter: Payer: Self-pay | Admitting: Family Medicine

## 2021-06-19 ENCOUNTER — Encounter: Payer: Self-pay | Admitting: Family Medicine

## 2021-06-19 ENCOUNTER — Telehealth (INDEPENDENT_AMBULATORY_CARE_PROVIDER_SITE_OTHER): Payer: Managed Care, Other (non HMO) | Admitting: Family Medicine

## 2021-06-19 DIAGNOSIS — F4321 Adjustment disorder with depressed mood: Secondary | ICD-10-CM | POA: Diagnosis not present

## 2021-06-19 DIAGNOSIS — R41840 Attention and concentration deficit: Secondary | ICD-10-CM

## 2021-06-19 DIAGNOSIS — G8929 Other chronic pain: Secondary | ICD-10-CM

## 2021-06-19 DIAGNOSIS — M5441 Lumbago with sciatica, right side: Secondary | ICD-10-CM | POA: Diagnosis not present

## 2021-06-19 MED ORDER — CYCLOBENZAPRINE HCL 10 MG PO TABS: 10.0000 mg | ORAL_TABLET | Freq: Every day | ORAL | 1 refills | Status: DC

## 2021-06-19 MED ORDER — OXYCODONE-ACETAMINOPHEN 5-325 MG PO TABS: 1.0000 | ORAL_TABLET | Freq: Two times a day (BID) | ORAL | 0 refills | Status: DC | PRN

## 2021-06-19 MED ORDER — SERTRALINE HCL 50 MG PO TABS: ORAL_TABLET | ORAL | 3 refills | Status: DC

## 2021-06-19 NOTE — Assessment & Plan Note (Signed)
Under good control on current regimen. Continue current regimen. Continue to monitor. Call with any concerns. Refills given. Rx should last 3+ months.   

## 2021-06-19 NOTE — Progress Notes (Signed)
There were no vitals taken for this visit.   Subjective:    Patient ID: Theresa Roberts, female    DOB: 1980-08-27, 40 y.o.   MRN: [id number]  HPI: Theresa Roberts is a 40 y.o. female  Chief Complaint  Patient presents with   Depression   Pain   ANXIETY/STRESS- had a bad reaction to her wellbutrin. Has been more anxious and trouble concentrating.  Duration: chronic, but worsening Status:uncontrolled Anxious mood: yes  Excessive worrying: yes Irritability: yes  Sweating: no Nausea: no Palpitations:no Hyperventilation: no Panic attacks: no Agoraphobia: no  Obscessions/compulsions: no Depressed mood: no Depression screen [facility] 2/9 06/19/2021 03/19/2021 08/12/2020 02/29/2020 07/18/2019  Decreased Interest 1 0 1 1 3   Down, Depressed, Hopeless 0 0 0 1 2  PHQ - 2 Score 1 0 1 2 5   Altered sleeping 0 1 1 1 3   Tired, decreased energy 0 1 1 1  0  Change in appetite 0 0 1 0 3  Feeling bad or failure about yourself  0 0 0 0 3  Trouble concentrating 3 3 3 1 3   Moving slowly or fidgety/restless 0 0 0 0 0  Suicidal thoughts 0 0 0 0 0  PHQ-9 Score 4 5 7 5 17   Difficult doing work/chores - Somewhat difficult Extremely dIfficult Not difficult at all Extremely dIfficult   Anhedonia: no Weight changes: no Insomnia: no   Hypersomnia: no Fatigue/loss of energy: no Feelings of worthlessness: no Feelings of guilt: no Impaired concentration/indecisiveness: yes Suicidal ideations: no  Crying spells: no Recent Stressors/Life Changes: yes   Relationship problems: no   Family stress: no     Financial stress: no    Job stress: no    Recent death/loss: no  CHRONIC PAIN  Present dose: 35 Morphine equivalents Pain control status: stable Duration: chronic Location: low back Quality: aching and sore Current Pain Level: moderate Previous Pain Level: moderate Breakthrough pain: yes Benefit from narcotic medications: yes Interested in weaning off narcotics:yes   Stool softners/OTC fiber:  no  Previous pain specialty evaluation: yes Non-narcotic analgesic meds: no Narcotic contract: yes  Relevant past medical, surgical, family and social history reviewed and updated as indicated. Interim medical history since our last visit reviewed. Allergies and medications reviewed and updated.  Review of Systems  Constitutional: Negative.   Respiratory: Negative.    Cardiovascular: Negative.   Gastrointestinal: Negative.   Musculoskeletal:  Positive for arthralgias, back pain and myalgias. Negative for gait problem, joint swelling, neck pain and neck stiffness.  Skin: Negative.   Neurological: Negative.   Psychiatric/Behavioral:  Positive for decreased concentration. Negative for agitation, behavioral problems, confusion, dysphoric mood, hallucinations, self-injury, sleep disturbance and suicidal ideas. The patient is nervous/anxious. The patient is not hyperactive.    Per HPI unless specifically indicated above     Objective:    There were no vitals taken for this visit.  Wt Readings from Last 3 Encounters:  03/19/21 150 lb (68 kg)  12/22/20 151 lb 3.2 oz (68.6 kg)  10/17/20 150 lb (68 kg)    Physical Exam Vitals and nursing note reviewed.  Constitutional:      General: She is not in acute distress.    Appearance: Normal appearance. She is not ill-appearing, toxic-appearing or diaphoretic.  HENT:     Head: Normocephalic and atraumatic.     Right Ear: External ear normal.     Left Ear: External ear normal.     Nose: Nose normal.     Mouth/Throat:  Mouth: Mucous membranes are moist.     Pharynx: Oropharynx is clear.  Eyes:     General: No scleral icterus.       Right eye: No discharge.        Left eye: No discharge.     Conjunctiva/sclera: Conjunctivae normal.     Pupils: Pupils are equal, round, and reactive to light.  Pulmonary:     Effort: Pulmonary effort is normal. No respiratory distress.     Comments: Speaking in full sentences Musculoskeletal:         General: Normal range of motion.     Cervical back: Normal range of motion.  Skin:    Coloration: Skin is not jaundiced or pale.     Findings: No bruising, erythema, lesion or rash.  Neurological:     Mental Status: She is alert and oriented to person, place, and time. Mental status is at baseline.  Psychiatric:        Mood and Affect: Mood normal.        Behavior: Behavior normal.        Thought Content: Thought content normal.        Judgment: Judgment normal.    Results for orders placed or performed in visit on 10/17/20  WET PREP FOR [place], YEAST, CLUE   Specimen: Sterile Swab   Sterile Swab  Result Value Ref Range   Trichomonas Exam Negative Negative   Yeast Exam Negative Negative   Clue Cell Exam Positive (A) Negative  Microscopic Examination   Urine  Result Value Ref Range   WBC, UA None seen 0 - 5 /hpf   RBC 3-10 (A) 0 - 2 /hpf   Epithelial Cells (non renal) 0-10 0 - 10 /hpf   Bacteria, UA None seen None seen/Few  CBC with Differential/Platelet  Result Value Ref Range   WBC 9.3 3.4 - 10.8 x10E3/uL   RBC 4.07 3.77 - 5.28 x10E6/uL   Hemoglobin 13.0 11.1 - 15.9 g/dL   Hematocrit 38.0 34.0 - 46.6 %   MCV 93 79 - 97 fL   MCH 31.9 26.6 - 33.0 pg   MCHC 34.2 31.5 - 35.7 g/dL   RDW 11.8 11.7 - 15.4 %   Platelets 302 150 - 450 x10E3/uL   Neutrophils 70 Not Estab. %   Lymphs 20 Not Estab. %   Monocytes 8 Not Estab. %   Eos 1 Not Estab. %   Basos 1 Not Estab. %   Neutrophils Absolute 6.4 1.4 - 7.0 x10E3/uL   Lymphocytes Absolute 1.9 0.7 - 3.1 x10E3/uL   Monocytes Absolute 0.8 0.1 - 0.9 x10E3/uL   EOS (ABSOLUTE) 0.1 0.0 - 0.4 x10E3/uL   Basophils Absolute 0.1 0.0 - 0.2 x10E3/uL   Immature Granulocytes 0 Not Estab. %   Immature Grans (Abs) 0.0 0.0 - 0.1 x10E3/uL  Comprehensive metabolic panel  Result Value Ref Range   Glucose 84 65 - 99 mg/dL   BUN 11 6 - 20 mg/dL   Creatinine, Ser 0.71 0.57 - 1.00 mg/dL   eGFR 112 >59 mL/min/1.73   BUN/Creatinine Ratio 15 9 - 23    Sodium 140 134 - 144 mmol/L   Potassium 3.9 3.5 - 5.2 mmol/L   Chloride 97 96 - 106 mmol/L   CO2 21 20 - 29 mmol/L   Calcium 8.9 8.7 - 10.2 mg/dL   Total Protein 7.0 6.0 - 8.5 g/dL   Albumin 4.6 3.8 - 4.8 g/dL   Globulin, Total 2.4 1.5 - 4.5 g/dL  Albumin/Globulin Ratio 1.9 1.2 - 2.2   Bilirubin Total 0.4 0.0 - 1.2 mg/dL   Alkaline Phosphatase 55 44 - 121 IU/L   AST 22 0 - 40 IU/L   ALT 17 0 - 32 IU/L  Lipid Panel w/o Chol/HDL Ratio  Result Value Ref Range   Cholesterol, Total 171 100 - 199 mg/dL   Triglycerides 74 0 - 149 mg/dL   HDL 80 >39 mg/dL   VLDL Cholesterol Cal 14 5 - 40 mg/dL   LDL Chol Calc (NIH) 77 0 - 99 mg/dL  Urinalysis, Routine w reflex microscopic  Result Value Ref Range   Specific Gravity, UA 1.010 1.005 - 1.030   pH, UA 6.0 5.0 - 7.5   Color, UA Yellow Yellow   Appearance Ur Clear Clear   Leukocytes,UA Negative Negative   Protein,UA Negative Negative/Trace   Glucose, UA Negative Negative   Ketones, UA Negative Negative   RBC, UA 1+ (A) Negative   Bilirubin, UA Negative Negative   Urobilinogen, Ur 0.2 0.2 - 1.0 mg/dL   Nitrite, UA Negative Negative   Microscopic Examination See below:   TSH  Result Value Ref Range   TSH 0.922 0.450 - 4.500 uIU/mL  [id number] 11+Oxyco+Alc+Crt-Bund  Result Value Ref Range   Ethanol Negative Cutoff=0.020 %   Amphetamines, Urine Negative Cutoff=1000 ng/mL   Barbiturate Negative Cutoff=200 ng/mL   BENZODIAZ UR QL Negative Cutoff=200 ng/mL   Cannabinoid Quant, Ur Negative Cutoff=50 ng/mL   Cocaine (Metabolite) Negative Cutoff=300 ng/mL   OPIATE SCREEN URINE Negative Cutoff=300 ng/mL   Oxycodone/Oxymorphone, Urine Negative Cutoff=300 ng/mL   Phencyclidine Negative Cutoff=25 ng/mL   Methadone Screen, Urine Negative Cutoff=300 ng/mL   Propoxyphene Negative Cutoff=300 ng/mL   Meperidine Negative Cutoff=200 ng/mL   Tramadol Negative Cutoff=200 ng/mL   Creatinine 65.2 20.0 - 300.0 mg/dL   pH, Urine 5.8 4.5 - 8.9       Assessment & Plan:   Problem List Items Addressed This Visit       Nervous and Auditory   Chronic midline low back pain with right-sided sciatica - Primary    Under good control on current regimen. Continue current regimen. Continue to monitor. Call with any concerns. Refills given. Rx should last 3+ months.        Relevant Medications   ibuprofen (ADVIL) 800 MG tablet   sertraline (ZOLOFT) 50 MG tablet   oxyCODONE-acetaminophen (PERCOCET/ROXICET) 5-325 MG tablet   cyclobenzaprine (FLEXERIL) 10 MG tablet     Other   Adjustment disorder with depressed mood    Will start sertraline. Recheck 1 month      Other Visit Diagnoses     Concentration deficit       Would like to do neuropsych testing. Referral placed today.   Relevant Orders   Ambulatory referral to Neuropsychology        Follow up plan: Return in about 4 weeks (around 07/17/2021), or follow up concentration.    This visit was completed via video visit through MyChart due to the restrictions of the COVID-19 pandemic. All issues as above were discussed and addressed. Physical exam was done as above through visual confirmation on video through MyChart. If it was felt that the patient should be evaluated in the office, they were directed there. The patient verbally consented to this visit. Location of the patient: home Location of the provider: work Those involved with this call:  Provider: Park Liter, DO CMA: Louanna Raw, [place] Desk/Registration: Myrlene Broker  Time spent on call:  25 minutes with patient face to face via video conference. More than 50% of this time was spent in counseling and coordination of care. 40 minutes total spent in review of patient's record and preparation of their chart.

## 2021-06-19 NOTE — Progress Notes (Signed)
Appointment scheduled.

## 2021-06-19 NOTE — Assessment & Plan Note (Signed)
Will start sertraline. Recheck 1 month

## 2021-07-07 ENCOUNTER — Encounter: Payer: Self-pay | Admitting: Psychology

## 2021-07-24 ENCOUNTER — Other Ambulatory Visit: Payer: Self-pay

## 2021-07-24 ENCOUNTER — Encounter: Payer: Self-pay | Admitting: Family Medicine

## 2021-07-24 ENCOUNTER — Ambulatory Visit: Payer: Managed Care, Other (non HMO) | Admitting: Family Medicine

## 2021-07-24 VITALS — BP 97/64 | HR 68 | Temp 99.0°F | Wt 153.8 lb

## 2021-07-24 DIAGNOSIS — R41840 Attention and concentration deficit: Secondary | ICD-10-CM

## 2021-07-24 NOTE — Progress Notes (Signed)
BP 97/64    Pulse 68    Temp 99 F (37.2 C)    Wt 153 lb 12.8 oz (69.8 kg)    BMI 26.40 kg/m    Subjective:    Patient ID: Theresa Roberts, female    DOB: [birth year], 40 y.o.   MRN: [id number]  HPI: Theresa Roberts is a 40 y.o. female  Chief Complaint  Patient presents with   concentration    Patient states it has gotten worse since last visit   ANXIETY/Concentration Deficit Duration:exacerbated Anxious mood: no  Excessive worrying: no Irritability: yes  Sweating: no Nausea: no Palpitations:no Hyperventilation: no Panic attacks: no Agoraphobia: no  Obscessions/compulsions: no Depressed mood: yes Depression screen [facility] 2/9 07/24/2021 06/19/2021 03/19/2021 08/12/2020 02/29/2020  Decreased Interest 1 1 0 1 1  Down, Depressed, Hopeless 1 0 0 0 1  PHQ - 2 Score 2 1 0 1 2  Altered sleeping 1 0 _0 Tired, decreased energy 1 0 _1 Change in appetite 0 0 0 1 0  Feeling bad or failure about yourself  0 0 0 0 0  Trouble concentrating _2 Moving slowly or fidgety/restless 2 0 0 0 0  Suicidal thoughts 1 0 0 0 0  PHQ-9 Score _3 Difficult doing work/chores - - Somewhat difficult Extremely dIfficult Not difficult at all   GAD 7 : Generalized Anxiety Score 07/24/2021 06/19/2021 08/12/2020 02/29/2020  Nervous, Anxious, on Edge _4 Control/stop worrying 1 0 1 0  Worry too much - different things _5 0  Trouble relaxing 2 0 1 2  Restless 2 3 0 1  Easily annoyed or irritable _6 0  Afraid - awful might happen 0 0 0 0  Total GAD 7 Score _7 Anxiety Difficulty Very difficult - Somewhat difficult Not difficult at all   Anhedonia: no Weight changes: no Insomnia: no   Hypersomnia: no Fatigue/loss of energy: no Feelings of worthlessness: no Feelings of guilt: no Impaired concentration/indecisiveness: yes Suicidal ideations: no  Crying spells: no Recent Stressors/Life Changes: yes  Relevant past medical, surgical, family and social history  reviewed and updated as indicated. Interim medical history since our last visit reviewed. Allergies and medications reviewed and updated.  Review of Systems  Constitutional: Negative.   Respiratory: Negative.    Cardiovascular: Negative.   Gastrointestinal: Negative.   Musculoskeletal: Negative.   Neurological: Negative.   Psychiatric/Behavioral:  Positive for decreased concentration and dysphoric mood. Negative for agitation, behavioral problems, confusion, hallucinations, self-injury, sleep disturbance and suicidal ideas. The patient is nervous/anxious and is hyperactive.    Per HPI unless specifically indicated above     Objective:    BP 97/64    Pulse 68    Temp 99 F (37.2 C)    Wt 153 lb 12.8 oz (69.8 kg)    BMI 26.40 kg/m   Wt Readings from Last 3 Encounters:  07/24/21 153 lb 12.8 oz (69.8 kg)  03/19/21 150 lb (68 kg)  12/22/20 151 lb 3.2 oz (68.6 kg)    Physical Exam Vitals and nursing note reviewed.  Constitutional:      General: She is not in acute distress.    Appearance: Normal appearance. She is not ill-appearing, toxic-appearing or diaphoretic.  HENT:     Head: Normocephalic and atraumatic.     Right Ear: External ear normal.  Left Ear: External ear normal.     Nose: Nose normal.     Mouth/Throat:     Mouth: Mucous membranes are moist.     Pharynx: Oropharynx is clear.  Eyes:     General: No scleral icterus.       Right eye: No discharge.        Left eye: No discharge.     Extraocular Movements: Extraocular movements intact.     Conjunctiva/sclera: Conjunctivae normal.     Pupils: Pupils are equal, round, and reactive to light.  Cardiovascular:     Rate and Rhythm: Normal rate and regular rhythm.     Pulses: Normal pulses.     Heart sounds: Normal heart sounds. No murmur heard.   No friction rub. No gallop.  Pulmonary:     Effort: Pulmonary effort is normal. No respiratory distress.     Breath sounds: Normal breath sounds. No stridor. No wheezing,  rhonchi or rales.  Chest:     Chest wall: No tenderness.  Musculoskeletal:        General: Normal range of motion.     Cervical back: Normal range of motion and neck supple.  Skin:    General: Skin is warm and dry.     Capillary Refill: Capillary refill takes less than 2 seconds.     Coloration: Skin is not jaundiced or pale.     Findings: No bruising, erythema, lesion or rash.  Neurological:     General: No focal deficit present.     Mental Status: She is alert and oriented to person, place, and time. Mental status is at baseline.  Psychiatric:        Mood and Affect: Mood normal.        Behavior: Behavior normal.        Thought Content: Thought content normal.        Judgment: Judgment normal.    Results for orders placed or performed in visit on 10/17/20  WET PREP FOR [place], YEAST, CLUE   Specimen: Sterile Swab   Sterile Swab  Result Value Ref Range   Trichomonas Exam Negative Negative   Yeast Exam Negative Negative   Clue Cell Exam Positive (A) Negative  Microscopic Examination   Urine  Result Value Ref Range   WBC, UA None seen 0 - 5 /hpf   RBC 3-10 (A) 0 - 2 /hpf   Epithelial Cells (non renal) 0-10 0 - 10 /hpf   Bacteria, UA None seen None seen/Few  CBC with Differential/Platelet  Result Value Ref Range   WBC 9.3 3.4 - 10.8 x10E3/uL   RBC 4.07 3.77 - 5.28 x10E6/uL   Hemoglobin 13.0 11.1 - 15.9 g/dL   Hematocrit 38.0 34.0 - 46.6 %   MCV 93 79 - 97 fL   MCH 31.9 26.6 - 33.0 pg   MCHC 34.2 31.5 - 35.7 g/dL   RDW 11.8 11.7 - 15.4 %   Platelets 302 150 - 450 x10E3/uL   Neutrophils 70 Not Estab. %   Lymphs 20 Not Estab. %   Monocytes 8 Not Estab. %   Eos 1 Not Estab. %   Basos 1 Not Estab. %   Neutrophils Absolute 6.4 1.4 - 7.0 x10E3/uL   Lymphocytes Absolute 1.9 0.7 - 3.1 x10E3/uL   Monocytes Absolute 0.8 0.1 - 0.9 x10E3/uL   EOS (ABSOLUTE) 0.1 0.0 - 0.4 x10E3/uL   Basophils Absolute 0.1 0.0 - 0.2 x10E3/uL   Immature Granulocytes 0 Not Estab. %  Immature  Grans (Abs) 0.0 0.0 - 0.1 x10E3/uL  Comprehensive metabolic panel  Result Value Ref Range   Glucose 84 65 - 99 mg/dL   BUN 11 6 - 20 mg/dL   Creatinine, Ser 0.71 0.57 - 1.00 mg/dL   eGFR 112 >59 mL/min/1.73   BUN/Creatinine Ratio 15 9 - 23   Sodium 140 134 - 144 mmol/L   Potassium 3.9 3.5 - 5.2 mmol/L   Chloride 97 96 - 106 mmol/L   CO2 21 20 - 29 mmol/L   Calcium 8.9 8.7 - 10.2 mg/dL   Total Protein 7.0 6.0 - 8.5 g/dL   Albumin 4.6 3.8 - 4.8 g/dL   Globulin, Total 2.4 1.5 - 4.5 g/dL   Albumin/Globulin Ratio 1.9 1.2 - 2.2   Bilirubin Total 0.4 0.0 - 1.2 mg/dL   Alkaline Phosphatase 55 44 - 121 IU/L   AST 22 0 - 40 IU/L   ALT 17 0 - 32 IU/L  Lipid Panel w/o Chol/HDL Ratio  Result Value Ref Range   Cholesterol, Total 171 100 - 199 mg/dL   Triglycerides 74 0 - 149 mg/dL   HDL 80 >39 mg/dL   VLDL Cholesterol Cal 14 5 - 40 mg/dL   LDL Chol Calc (NIH) 77 0 - 99 mg/dL  Urinalysis, Routine w reflex microscopic  Result Value Ref Range   Specific Gravity, UA 1.010 1.005 - 1.030   pH, UA 6.0 5.0 - 7.5   Color, UA Yellow Yellow   Appearance Ur Clear Clear   Leukocytes,UA Negative Negative   Protein,UA Negative Negative/Trace   Glucose, UA Negative Negative   Ketones, UA Negative Negative   RBC, UA 1+ (A) Negative   Bilirubin, UA Negative Negative   Urobilinogen, Ur 0.2 0.2 - 1.0 mg/dL   Nitrite, UA Negative Negative   Microscopic Examination See below:   TSH  Result Value Ref Range   TSH 0.922 0.450 - 4.500 uIU/mL  [id number] 11+Oxyco+Alc+Crt-Bund  Result Value Ref Range   Ethanol Negative Cutoff=0.020 %   Amphetamines, Urine Negative Cutoff=1000 ng/mL   Barbiturate Negative Cutoff=200 ng/mL   BENZODIAZ UR QL Negative Cutoff=200 ng/mL   Cannabinoid Quant, Ur Negative Cutoff=50 ng/mL   Cocaine (Metabolite) Negative Cutoff=300 ng/mL   OPIATE SCREEN URINE Negative Cutoff=300 ng/mL   Oxycodone/Oxymorphone, Urine Negative Cutoff=300 ng/mL   Phencyclidine Negative Cutoff=25 ng/mL    Methadone Screen, Urine Negative Cutoff=300 ng/mL   Propoxyphene Negative Cutoff=300 ng/mL   Meperidine Negative Cutoff=200 ng/mL   Tramadol Negative Cutoff=200 ng/mL   Creatinine 65.2 20.0 - 300.0 mg/dL   pH, Urine 5.8 4.5 - 8.9      Assessment & Plan:   Problem List Items Addressed This Visit   None Visit Diagnoses     Concentration deficit    -  Primary   Will check on labs and await neuropsych testing. Follow up 1 month.    Relevant Orders   Comprehensive metabolic panel   CBC with Differential/Platelet   TSH   VITAMIN D 25 Hydroxy (Vit-D Deficiency, Fractures)        Follow up plan: Return in about 4 weeks (around 08/21/2021) for follow up mood.

## 2021-08-11 ENCOUNTER — Other Ambulatory Visit: Payer: Self-pay

## 2021-08-11 ENCOUNTER — Encounter: Payer: Managed Care, Other (non HMO) | Attending: Psychology | Admitting: Psychology

## 2021-08-11 DIAGNOSIS — F4321 Adjustment disorder with depressed mood: Secondary | ICD-10-CM | POA: Diagnosis not present

## 2021-08-11 DIAGNOSIS — R41840 Attention and concentration deficit: Secondary | ICD-10-CM | POA: Insufficient documentation

## 2021-08-13 ENCOUNTER — Other Ambulatory Visit: Payer: Self-pay

## 2021-08-13 DIAGNOSIS — R41840 Attention and concentration deficit: Secondary | ICD-10-CM

## 2021-08-13 NOTE — Progress Notes (Signed)
° °  Behavioral Observation  The patient appeared well-groomed and appropriately dressed. Her manners were polite and appropriate to the situation. The patient expressed some frustration with the test and was occasionally distracted. She complied with all testing instructions and maintained a positive attitude toward testing.   The mixed discriminate reaction time test was repeated due to confusion about the instructions; both attempts are recorded in testing data.  Neuropsychology Note  Theresa Roberts completed 90 minutes of neuropsychological testing with technician, Dina Rich, BA, under the supervision of Ilean Skill, PsyD., Clinical Neuropsychologist. The patient did not appear overtly distressed by the testing session, per behavioral observation or via self-report to the technician. Rest breaks were offered.   Clinical Decision Making: In considering the patient's current level of functioning, level of presumed impairment, nature of symptoms, emotional and behavioral responses during clinical interview, level of literacy, and observed level of motivation/effort, a battery of tests was selected by Dr. Sima Matas during initial consultation on 08/11/2021. This was communicated to the technician. Communication between the neuropsychologist and technician was ongoing throughout the testing session and changes were made as deemed necessary based on patient performance on testing, technician observations and additional pertinent factors such as those listed above.  Tests Administered: Comprehensive Attention Battery (CAB) Continuous Performance Test (CPT)  Results: Will be included in final report   Feedback to Patient: Theresa Roberts will return on 01/05/2022 for an interactive feedback session with Dr. Sima Matas at which time her test performances, clinical impressions and treatment recommendations will be reviewed in detail. The patient understands she can contact our office should  she require our assistance before this time.  90 minutes spent face-to-face with patient administering standardized tests, 30 minutes spent scoring Environmental education officer). [CPT [id number], [id number]]  Full report to follow.

## 2021-08-21 ENCOUNTER — Ambulatory Visit: Payer: Managed Care, Other (non HMO) | Admitting: Family Medicine

## 2021-09-01 ENCOUNTER — Encounter: Payer: Self-pay | Admitting: Family Medicine

## 2021-09-01 ENCOUNTER — Ambulatory Visit (INDEPENDENT_AMBULATORY_CARE_PROVIDER_SITE_OTHER): Payer: Managed Care, Other (non HMO) | Admitting: Family Medicine

## 2021-09-01 ENCOUNTER — Other Ambulatory Visit: Payer: Self-pay

## 2021-09-01 VITALS — BP 117/77 | HR 88 | Temp 98.3°F | Wt 152.2 lb

## 2021-09-01 DIAGNOSIS — R41840 Attention and concentration deficit: Secondary | ICD-10-CM | POA: Diagnosis not present

## 2021-09-01 DIAGNOSIS — F4321 Adjustment disorder with depressed mood: Secondary | ICD-10-CM

## 2021-09-01 MED ORDER — SERTRALINE HCL 50 MG PO TABS: ORAL_TABLET | ORAL | 1 refills | Status: DC

## 2021-09-01 NOTE — Progress Notes (Signed)
BP 117/77    Pulse 88    Temp 98.3 F (36.8 C) (Oral)    Wt 152 lb 3.2 oz (69 kg)    LMP  (LMP Unknown)    SpO2 98%    BMI 26.13 kg/m    Subjective:    Patient ID: Theresa Roberts, female    DOB: 1980-11-28, 41 y.o.   MRN: [id number]  HPI: Theresa Roberts is a 41 y.o. female  Chief Complaint  Patient presents with   Concentration    Anxiety   ANXIETY/Concentration Deficit- has had her neuropsych testing done on 08/17/21. Does not have results available yet. She has a follow up with them on 5/30 Duration:stable Anxious mood: yes  Excessive worrying: no Irritability: yes  Sweating: no Nausea: no Palpitations:yes Hyperventilation: no Panic attacks: no Agoraphobia: no  Obscessions/compulsions: no Depressed mood: yes Depression screen [facility] 2/9 09/01/2021 07/24/2021 06/19/2021 03/19/2021 08/12/2020  Decreased Interest 1 1 1  0 1  Down, Depressed, Hopeless 0 1 0 0 0  PHQ - 2 Score 1 2 1  0 1  Altered sleeping 0 1 0 1 1  Tired, decreased energy 1 1 0 1 1  Change in appetite 0 0 0 0 1  Feeling bad or failure about yourself  0 0 0 0 0  Trouble concentrating 3 3 3 3 3   Moving slowly or fidgety/restless 1 2 0 0 0  Suicidal thoughts 0 1 0 0 0  PHQ-9 Score 6 10 4 5 7   Difficult doing work/chores Somewhat difficult - - Somewhat difficult Extremely dIfficult  Some recent data might be hidden   GAD 7 : Generalized Anxiety Score 09/01/2021 07/24/2021 06/19/2021 08/12/2020  Nervous, Anxious, on Edge 1 1 1 1   Control/stop worrying 1 1 0 1  Worry too much - different things 1 1 3 1   Trouble relaxing 1 2 0 1  Restless 1 2 3  0  Easily annoyed or irritable 1 2 3 1   Afraid - awful might happen 0 0 0 0  Total GAD 7 Score 6 9 10 5   Anxiety Difficulty Somewhat difficult Very difficult - Somewhat difficult    Anhedonia: no Weight changes: no Insomnia: no   Hypersomnia: no Fatigue/loss of energy: yes Feelings of worthlessness: no Feelings of guilt: no Impaired concentration/indecisiveness:  yes Suicidal ideations: no  Crying spells: no Recent Stressors/Life Changes: yes   Relationship problems: no   Family stress: no     Financial stress: no    Job stress: no    Recent death/loss: no  Relevant past medical, surgical, family and social history reviewed and updated as indicated. Interim medical history since our last visit reviewed. Allergies and medications reviewed and updated.  Review of Systems  Constitutional: Negative.   Respiratory: Negative.    Cardiovascular: Negative.   Musculoskeletal: Negative.   Psychiatric/Behavioral:  Positive for decreased concentration and dysphoric mood. Negative for agitation, behavioral problems, confusion, hallucinations, self-injury, sleep disturbance and suicidal ideas. The patient is nervous/anxious and is hyperactive.    Per HPI unless specifically indicated above     Objective:    BP 117/77    Pulse 88    Temp 98.3 F (36.8 C) (Oral)    Wt 152 lb 3.2 oz (69 kg)    LMP  (LMP Unknown)    SpO2 98%    BMI 26.13 kg/m   Wt Readings from Last 3 Encounters:  09/01/21 152 lb 3.2 oz (69 kg)  07/24/21 153 lb 12.8 oz (69.8  kg)  03/19/21 150 lb (68 kg)    Physical Exam Vitals and nursing note reviewed.  Constitutional:      General: She is not in acute distress.    Appearance: Normal appearance. She is not ill-appearing, toxic-appearing or diaphoretic.  HENT:     Head: Normocephalic and atraumatic.     Right Ear: External ear normal.     Left Ear: External ear normal.     Nose: Nose normal.     Mouth/Throat:     Mouth: Mucous membranes are moist.     Pharynx: Oropharynx is clear.  Eyes:     General: No scleral icterus.       Right eye: No discharge.        Left eye: No discharge.     Extraocular Movements: Extraocular movements intact.     Conjunctiva/sclera: Conjunctivae normal.     Pupils: Pupils are equal, round, and reactive to light.  Cardiovascular:     Rate and Rhythm: Normal rate and regular rhythm.     Pulses:  Normal pulses.     Heart sounds: Normal heart sounds. No murmur heard.   No friction rub. No gallop.  Pulmonary:     Effort: Pulmonary effort is normal. No respiratory distress.     Breath sounds: Normal breath sounds. No stridor. No wheezing, rhonchi or rales.  Chest:     Chest wall: No tenderness.  Musculoskeletal:        General: Normal range of motion.     Cervical back: Normal range of motion and neck supple.  Skin:    General: Skin is warm and dry.     Capillary Refill: Capillary refill takes less than 2 seconds.     Coloration: Skin is not jaundiced or pale.     Findings: No bruising, erythema, lesion or rash.  Neurological:     General: No focal deficit present.     Mental Status: She is alert and oriented to person, place, and time. Mental status is at baseline.  Psychiatric:        Mood and Affect: Mood normal.        Behavior: Behavior normal.        Thought Content: Thought content normal.        Judgment: Judgment normal.    Results for orders placed or performed in visit on 10/17/20  WET PREP FOR [place], YEAST, CLUE   Specimen: Sterile Swab   Sterile Swab  Result Value Ref Range   Trichomonas Exam Negative Negative   Yeast Exam Negative Negative   Clue Cell Exam Positive (A) Negative  Microscopic Examination   Urine  Result Value Ref Range   WBC, UA None seen 0 - 5 /hpf   RBC 3-10 (A) 0 - 2 /hpf   Epithelial Cells (non renal) 0-10 0 - 10 /hpf   Bacteria, UA None seen None seen/Few  CBC with Differential/Platelet  Result Value Ref Range   WBC 9.3 3.4 - 10.8 x10E3/uL   RBC 4.07 3.77 - 5.28 x10E6/uL   Hemoglobin 13.0 11.1 - 15.9 g/dL   Hematocrit 38.0 34.0 - 46.6 %   MCV 93 79 - 97 fL   MCH 31.9 26.6 - 33.0 pg   MCHC 34.2 31.5 - 35.7 g/dL   RDW 11.8 11.7 - 15.4 %   Platelets 302 150 - 450 x10E3/uL   Neutrophils 70 Not Estab. %   Lymphs 20 Not Estab. %   Monocytes 8 Not Estab. %  Eos 1 Not Estab. %   Basos 1 Not Estab. %   Neutrophils Absolute 6.4  1.4 - 7.0 x10E3/uL   Lymphocytes Absolute 1.9 0.7 - 3.1 x10E3/uL   Monocytes Absolute 0.8 0.1 - 0.9 x10E3/uL   EOS (ABSOLUTE) 0.1 0.0 - 0.4 x10E3/uL   Basophils Absolute 0.1 0.0 - 0.2 x10E3/uL   Immature Granulocytes 0 Not Estab. %   Immature Grans (Abs) 0.0 0.0 - 0.1 x10E3/uL  Comprehensive metabolic panel  Result Value Ref Range   Glucose 84 65 - 99 mg/dL   BUN 11 6 - 20 mg/dL   Creatinine, Ser 0.71 0.57 - 1.00 mg/dL   eGFR 112 >59 mL/min/1.73   BUN/Creatinine Ratio 15 9 - 23   Sodium 140 134 - 144 mmol/L   Potassium 3.9 3.5 - 5.2 mmol/L   Chloride 97 96 - 106 mmol/L   CO2 21 20 - 29 mmol/L   Calcium 8.9 8.7 - 10.2 mg/dL   Total Protein 7.0 6.0 - 8.5 g/dL   Albumin 4.6 3.8 - 4.8 g/dL   Globulin, Total 2.4 1.5 - 4.5 g/dL   Albumin/Globulin Ratio 1.9 1.2 - 2.2   Bilirubin Total 0.4 0.0 - 1.2 mg/dL   Alkaline Phosphatase 55 44 - 121 IU/L   AST 22 0 - 40 IU/L   ALT 17 0 - 32 IU/L  Lipid Panel w/o Chol/HDL Ratio  Result Value Ref Range   Cholesterol, Total 171 100 - 199 mg/dL   Triglycerides 74 0 - 149 mg/dL   HDL 80 >39 mg/dL   VLDL Cholesterol Cal 14 5 - 40 mg/dL   LDL Chol Calc (NIH) 77 0 - 99 mg/dL  Urinalysis, Routine w reflex microscopic  Result Value Ref Range   Specific Gravity, UA 1.010 1.005 - 1.030   pH, UA 6.0 5.0 - 7.5   Color, UA Yellow Yellow   Appearance Ur Clear Clear   Leukocytes,UA Negative Negative   Protein,UA Negative Negative/Trace   Glucose, UA Negative Negative   Ketones, UA Negative Negative   RBC, UA 1+ (A) Negative   Bilirubin, UA Negative Negative   Urobilinogen, Ur 0.2 0.2 - 1.0 mg/dL   Nitrite, UA Negative Negative   Microscopic Examination See below:   TSH  Result Value Ref Range   TSH 0.922 0.450 - 4.500 uIU/mL  [id number] 11+Oxyco+Alc+Crt-Bund  Result Value Ref Range   Ethanol Negative Cutoff=0.020 %   Amphetamines, Urine Negative Cutoff=1000 ng/mL   Barbiturate Negative Cutoff=200 ng/mL   BENZODIAZ UR QL Negative Cutoff=200 ng/mL    Cannabinoid Quant, Ur Negative Cutoff=50 ng/mL   Cocaine (Metabolite) Negative Cutoff=300 ng/mL   OPIATE SCREEN URINE Negative Cutoff=300 ng/mL   Oxycodone/Oxymorphone, Urine Negative Cutoff=300 ng/mL   Phencyclidine Negative Cutoff=25 ng/mL   Methadone Screen, Urine Negative Cutoff=300 ng/mL   Propoxyphene Negative Cutoff=300 ng/mL   Meperidine Negative Cutoff=200 ng/mL   Tramadol Negative Cutoff=200 ng/mL   Creatinine 65.2 20.0 - 300.0 mg/dL   pH, Urine 5.8 4.5 - 8.9      Assessment & Plan:   Problem List Items Addressed This Visit       Other   Adjustment disorder with depressed mood    Under good control on current regimen. Continue current regimen. Continue to monitor. Call with any concerns. Refills given.        Other Visit Diagnoses     Concentration deficit    -  Primary   Awaiting results of neuropsych testing. Continue to monitor. Call with any concerns.  Follow up plan: Return in about 3 months (around 11/30/2021).

## 2021-09-01 NOTE — Assessment & Plan Note (Signed)
Under good control on current regimen. Continue current regimen. Continue to monitor. Call with any concerns. Refills given.   
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
## 2021-10-21 ENCOUNTER — Encounter: Payer: Self-pay | Admitting: Psychology

## 2021-10-21 ENCOUNTER — Encounter: Payer: Managed Care, Other (non HMO) | Attending: Psychology | Admitting: Psychology

## 2021-10-21 ENCOUNTER — Other Ambulatory Visit: Payer: Self-pay

## 2021-10-21 DIAGNOSIS — F4312 Post-traumatic stress disorder, chronic: Secondary | ICD-10-CM | POA: Diagnosis present

## 2021-10-21 DIAGNOSIS — R41840 Attention and concentration deficit: Secondary | ICD-10-CM | POA: Diagnosis present

## 2021-10-21 DIAGNOSIS — F411 Generalized anxiety disorder: Secondary | ICD-10-CM | POA: Diagnosis present
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
## 2021-11-29 ENCOUNTER — Other Ambulatory Visit: Payer: Self-pay | Admitting: Family Medicine

## 2021-11-30 ENCOUNTER — Encounter: Payer: Self-pay | Admitting: Family Medicine

## 2021-11-30 ENCOUNTER — Ambulatory Visit: Payer: Managed Care, Other (non HMO) | Admitting: Family Medicine

## 2021-11-30 VITALS — BP 96/61 | HR 65 | Temp 98.3°F | Wt 159.6 lb

## 2021-11-30 DIAGNOSIS — M5441 Lumbago with sciatica, right side: Secondary | ICD-10-CM

## 2021-11-30 DIAGNOSIS — R41840 Attention and concentration deficit: Secondary | ICD-10-CM

## 2021-11-30 DIAGNOSIS — F4312 Post-traumatic stress disorder, chronic: Secondary | ICD-10-CM | POA: Diagnosis not present

## 2021-11-30 DIAGNOSIS — G8929 Other chronic pain: Secondary | ICD-10-CM

## 2021-11-30 DIAGNOSIS — F411 Generalized anxiety disorder: Secondary | ICD-10-CM | POA: Diagnosis not present

## 2021-11-30 MED ORDER — FLUOXETINE HCL 20 MG PO CAPS: ORAL_CAPSULE | ORAL | 1 refills | Status: DC

## 2021-11-30 MED ORDER — OXYCODONE-ACETAMINOPHEN 5-325 MG PO TABS: 1.0000 | ORAL_TABLET | Freq: Two times a day (BID) | ORAL | 0 refills | Status: DC | PRN

## 2021-11-30 NOTE — Assessment & Plan Note (Signed)
Will try weekly dosing fluoxetine as she is very nervous about taking meds and has trouble taking them daily. Recheck 1 month. Call with any concerns. ?

## 2021-11-30 NOTE — Assessment & Plan Note (Signed)
Under good control on current regimen. Continue current regimen. Continue to monitor. Call with any concerns. Refills given. 60 pills should last about 3-6 months.   ? ?

## 2021-11-30 NOTE — Progress Notes (Signed)
? ?BP 96/61   Pulse 65   Temp 98.3 ?F (36.8 ?C)   Wt 159 lb 9.6 oz (72.4 kg)   SpO2 97%   BMI 27.40 kg/m?   ? ?Subjective:  ? ? Patient ID: Theresa Roberts, female    DOB: 1981-03-11, 41 y.o.   MRN: [id number] ? ?HPI: ?Theresa Roberts is a 41 y.o. female ? ?Chief Complaint  ?Patient presents with  ? Concentration deficit  ? Adjustment disorder with depressed mood  ? ?ANXIETY/STRESS- seeing a therapist. Co-pay is more expensive to see a specialist and she is concerned about that. The counselor started her on something that she doesn't remember what it was, but she stopped it. She went back on the buspar. She is very nervous about taking a medicine daily and she is very concerned about taking something  ?Duration: chronic ?Status:uncontrolled ?Anxious mood: yes  ?Excessive worrying: yes ?Irritability: no  ?Sweating: no ?Nausea: no ?Palpitations:no ?Hyperventilation: no ?Panic attacks: no ?Agoraphobia: no  ?Obscessions/compulsions: yes ?Depressed mood: no ? ?  11/30/2021  ?  4:17 PM 09/01/2021  ? 11:07 AM 07/24/2021  ?  2:01 PM 06/19/2021  ?  1:15 PM 03/19/2021  ?  2:03 PM  ?Depression screen PHQ 2/9  ?Decreased Interest 0 1 1 1  0  ?Down, Depressed, Hopeless 0 0 1 0 0  ?PHQ - 2 Score 0 1 2 1  0  ?Altered sleeping 1 0 1 0 1  ?Tired, decreased energy 1 1 1  0 1  ?Change in appetite 0 0 0 0 0  ?Feeling bad or failure about yourself  0 0 0 0 0  ?Trouble concentrating 3 3 3 3 3   ?Moving slowly or fidgety/restless 2 1 2  0 0  ?Suicidal thoughts 0 0 1 0 0  ?PHQ-9 Score 7 6 10 4 5   ?Difficult doing work/chores  Somewhat difficult   Somewhat difficult  ? ? ?  11/30/2021  ?  4:17 PM 09/01/2021  ? 11:08 AM 07/24/2021  ?  2:01 PM 06/19/2021  ?  1:17 PM  ?GAD 7 : Generalized Anxiety Score  ?Nervous, Anxious, on Edge 2 1 1 1   ?Control/stop worrying 1 1 1  0  ?Worry too much - different things 1 1 1 3   ?Trouble relaxing 1 1 2  0  ?Restless 2 1 2 3   ?Easily annoyed or irritable 1 1 2 3   ?Afraid - awful might happen 0 0 0 0  ?Total GAD 7  Score 8 6 9 10   ?Anxiety Difficulty Extremely difficult Somewhat difficult Very difficult   ? ?Anhedonia: no ?Weight changes: no ?Insomnia: no   ?Hypersomnia: no ?Fatigue/loss of energy: yes ?Feelings of worthlessness: no ?Feelings of guilt: no ?Impaired concentration/indecisiveness: no ?Suicidal ideations: no  ?Crying spells: no ?Recent Stressors/Life Changes: yes ?  Relationship problems: no ?  Family stress: no   ?  Financial stress: no  ?  Job stress: no  ?  Recent death/loss: no ? ?CHRONIC PAIN  ?Present dose:  less than 15 Morphine equivalents ?Pain control status: better ?Duration: chronic ?Location: low back ?Quality: aching and sore ?Current Pain Level: moderate ?Previous Pain Level: severe ?Breakthrough pain: no ?Benefit from narcotic medications: yes ?What Activities task can be accomplished with current medication? Able to work and do her ADLs ?Interested in weaning off narcotics:yes   ?Stool softners/OTC fiber: no  ?Previous pain specialty evaluation: no ?Non-narcotic analgesic meds: no ?Narcotic contract: yes ? ?Relevant past medical, surgical, family and social history reviewed and updated as  indicated. Interim medical history since our last visit reviewed. ?Allergies and medications reviewed and updated. ? ?Review of Systems  ?Constitutional: Negative.   ?Respiratory: Negative.    ?Cardiovascular: Negative.   ?Musculoskeletal: Negative.   ?Skin: Negative.   ?Neurological: Negative.   ?Psychiatric/Behavioral:  Positive for decreased concentration. Negative for agitation, behavioral problems, confusion, dysphoric mood, hallucinations, self-injury, sleep disturbance and suicidal ideas. The patient is nervous/anxious. The patient is not hyperactive.   ? ?Per HPI unless specifically indicated above ? ?   ?Objective:  ?  ?BP 96/61   Pulse 65   Temp 98.3 ?F (36.8 ?C)   Wt 159 lb 9.6 oz (72.4 kg)   SpO2 97%   BMI 27.40 kg/m?   ?Wt Readings from Last 3 Encounters:  ?11/30/21 159 lb 9.6 oz (72.4 kg)   ?09/01/21 152 lb 3.2 oz (69 kg)  ?07/24/21 153 lb 12.8 oz (69.8 kg)  ?  ?Physical Exam ?Vitals and nursing note reviewed.  ?Constitutional:   ?   General: She is not in acute distress. ?   Appearance: Normal appearance. She is not ill-appearing, toxic-appearing or diaphoretic.  ?HENT:  ?   Head: Normocephalic and atraumatic.  ?   Right Ear: External ear normal.  ?   Left Ear: External ear normal.  ?   Nose: Nose normal.  ?   Mouth/Throat:  ?   Mouth: Mucous membranes are moist.  ?   Pharynx: Oropharynx is clear.  ?Eyes:  ?   General: No scleral icterus.    ?   Right eye: No discharge.     ?   Left eye: No discharge.  ?   Extraocular Movements: Extraocular movements intact.  ?   Conjunctiva/sclera: Conjunctivae normal.  ?   Pupils: Pupils are equal, round, and reactive to light.  ?Cardiovascular:  ?   Rate and Rhythm: Normal rate and regular rhythm.  ?   Pulses: Normal pulses.  ?   Heart sounds: Normal heart sounds. No murmur heard. ?  No friction rub. No gallop.  ?Pulmonary:  ?   Effort: Pulmonary effort is normal. No respiratory distress.  ?   Breath sounds: Normal breath sounds. No stridor. No wheezing, rhonchi or rales.  ?Chest:  ?   Chest wall: No tenderness.  ?Musculoskeletal:     ?   General: Normal range of motion.  ?   Cervical back: Normal range of motion and neck supple.  ?Skin: ?   General: Skin is warm and dry.  ?   Capillary Refill: Capillary refill takes less than 2 seconds.  ?   Coloration: Skin is not jaundiced or pale.  ?   Findings: No bruising, erythema, lesion or rash.  ?Neurological:  ?   General: No focal deficit present.  ?   Mental Status: She is alert and oriented to person, place, and time. Mental status is at baseline.  ?Psychiatric:     ?   Mood and Affect: Mood normal.     ?   Behavior: Behavior normal.     ?   Thought Content: Thought content normal.     ?   Judgment: Judgment normal.  ? ? ?Results for orders placed or performed in visit on 10/17/20  ?WET PREP FOR [place], YEAST, CLUE  ?  Specimen: Sterile Swab  ? Sterile Swab  ?Result Value Ref Range  ? Trichomonas Exam Negative Negative  ? Yeast Exam Negative Negative  ? Clue Cell Exam Positive (A) Negative  ?Microscopic Examination  ?  Urine  ?Result Value Ref Range  ? WBC, UA None seen 0 - 5 /hpf  ? RBC 3-10 (A) 0 - 2 /hpf  ? Epithelial Cells (non renal) 0-10 0 - 10 /hpf  ? Bacteria, UA None seen None seen/Few  ?CBC with Differential/Platelet  ?Result Value Ref Range  ? WBC 9.3 3.4 - 10.8 x10E3/uL  ? RBC 4.07 3.77 - 5.28 x10E6/uL  ? Hemoglobin 13.0 11.1 - 15.9 g/dL  ? Hematocrit 38.0 34.0 - 46.6 %  ? MCV 93 79 - 97 fL  ? MCH 31.9 26.6 - 33.0 pg  ? MCHC 34.2 31.5 - 35.7 g/dL  ? RDW 11.8 11.7 - 15.4 %  ? Platelets 302 150 - 450 x10E3/uL  ? Neutrophils 70 Not Estab. %  ? Lymphs 20 Not Estab. %  ? Monocytes 8 Not Estab. %  ? Eos 1 Not Estab. %  ? Basos 1 Not Estab. %  ? Neutrophils Absolute 6.4 1.4 - 7.0 x10E3/uL  ? Lymphocytes Absolute 1.9 0.7 - 3.1 x10E3/uL  ? Monocytes Absolute 0.8 0.1 - 0.9 x10E3/uL  ? EOS (ABSOLUTE) 0.1 0.0 - 0.4 x10E3/uL  ? Basophils Absolute 0.1 0.0 - 0.2 x10E3/uL  ? Immature Granulocytes 0 Not Estab. %  ? Immature Grans (Abs) 0.0 0.0 - 0.1 x10E3/uL  ?Comprehensive metabolic panel  ?Result Value Ref Range  ? Glucose 84 65 - 99 mg/dL  ? BUN 11 6 - 20 mg/dL  ? Creatinine, Ser 0.71 0.57 - 1.00 mg/dL  ? eGFR 112 >59 mL/min/1.73  ? BUN/Creatinine Ratio 15 9 - 23  ? Sodium 140 134 - 144 mmol/L  ? Potassium 3.9 3.5 - 5.2 mmol/L  ? Chloride 97 96 - 106 mmol/L  ? CO2 21 20 - 29 mmol/L  ? Calcium 8.9 8.7 - 10.2 mg/dL  ? Total Protein 7.0 6.0 - 8.5 g/dL  ? Albumin 4.6 3.8 - 4.8 g/dL  ? Globulin, Total 2.4 1.5 - 4.5 g/dL  ? Albumin/Globulin Ratio 1.9 1.2 - 2.2  ? Bilirubin Total 0.4 0.0 - 1.2 mg/dL  ? Alkaline Phosphatase 55 44 - 121 IU/L  ? AST 22 0 - 40 IU/L  ? ALT 17 0 - 32 IU/L  ?Lipid Panel w/o Chol/HDL Ratio  ?Result Value Ref Range  ? Cholesterol, Total 171 100 - 199 mg/dL  ? Triglycerides 74 0 - 149 mg/dL  ? HDL 80 >39 mg/dL  ?  VLDL Cholesterol Cal 14 5 - 40 mg/dL  ? LDL Chol Calc (NIH) 77 0 - 99 mg/dL  ?Urinalysis, Routine w reflex microscopic  ?Result Value Ref Range  ? Specific Gravity, UA 1.010 1.005 - 1.030  ? pH, UA 6.0 5.0 - 7.5  ? C

## 2021-12-01 NOTE — Telephone Encounter (Signed)
Requested Prescriptions  ?Pending Prescriptions Disp Refills  ?? acyclovir (ZOVIRAX) 800 MG tablet [Pharmacy Med Name: ACYCLOVIR '800MG'$  TABLETS] 30 tablet 12  ?  Sig: TAKE 1 TABLET(800 MG) BY MOUTH TWICE DAILY  ?  ? Antimicrobials:  Antiviral Agents - Anti-Herpetic Passed - 11/29/2021 12:01 PM  ?  ?  Passed - Valid encounter within last 12 months  ?  Recent Outpatient Visits   ?      ? Yesterday GAD (generalized anxiety disorder)  ? [place], Megan P, DO  ? 3 months ago Concentration deficit  ? [place], Megan P, DO  ? 4 months ago Concentration deficit  ? [place], [place] P, DO  ? 5 months ago Chronic midline low back pain with right-sided sciatica  ? [place] P, DO  ? 8 months ago Acute pain of left knee  ? [place], [place] P, DO  ?  ?  ?Future Appointments   ?        ? In 3 weeks Wynetta Emery, Barb Merino, DO [facility]  ?  ? ?  ?  ?  ? ?

## 2021-12-02 ENCOUNTER — Telehealth: Payer: Self-pay

## 2021-12-02 NOTE — Telephone Encounter (Signed)
PA initiated via [facility] via telephone call for oxycodone/acetaminophen 5-325 mg ?Approved through 06/03/2022 ?Patient is aware.  ?

## 2021-12-24 ENCOUNTER — Encounter: Payer: Self-pay | Admitting: Obstetrics and Gynecology

## 2021-12-24 ENCOUNTER — Ambulatory Visit: Payer: Managed Care, Other (non HMO) | Admitting: Obstetrics and Gynecology

## 2021-12-24 VITALS — BP 120/66 | Ht 65.0 in | Wt 159.0 lb

## 2021-12-24 DIAGNOSIS — Z3046 Encounter for surveillance of implantable subdermal contraceptive: Secondary | ICD-10-CM | POA: Diagnosis not present

## 2021-12-24 MED ORDER — ETONOGESTREL 68 MG ~~LOC~~ IMPL: 1.0000 | DRUG_IMPLANT | Freq: Once | SUBCUTANEOUS | 0 refills | Status: DC

## 2021-12-24 NOTE — Progress Notes (Signed)
    Chief Complaint  Patient presents with   Contraception    Removal and Insertion     History of Present Illness:  Theresa Roberts is a 41 y.o. that had a 3rd nexplanon placed 12/19. Since that time, she has done well. Has monthly light bleeding for 3-5 days. Headaches and back dysmen significantly improved with nexplanon; has noticed increased sx since nexplanon now expired. Wants another one. She was sex active with female partner in past; no recent sexual activity.   Paps with PCP; neg pap/neg HPV DNA 4/19  Nexplanon removal Procedure note - The Nexplanon was noted in the patient's arm and the end was identified. The skin was cleansed with a Betadine solution. A small injection of subcutaneous lidocaine with epinephrine was given over the end of the implant. An incision was made at the end of the implant. The rod was noted in the incision and grasped with a hemostat. It was noted to be intact.  Steri-Strip was placed approximating the incision. Hemostasis was noted.  BP 120/66   Ht '5\' 5"'$  (1.651 m)   Wt 159 lb (72.1 kg)   BMI 26.46 kg/m    Nexplanon Insertion  Patient given informed consent, signed copy in the chart, time out was performed.  Appropriate time out taken.  Patient's LEFT arm was prepped and draped in the usual sterile fashion. The ruler used to measure and mark insertion area.  Pt was prepped with betadine swab and then injected with 1.0 cc of 2% lidocaine with epinephrine. Nexplanon removed form packaging,  Device confirmed in needle, then inserted full length of needle and withdrawn per handbook instructions.  Pt insertion site covered with steri-strip and a bandage.   Minimal blood loss.  Pt tolerated the procedure welL.  Assessment: Encounter for removal and reinsertion of Nexplanon - Plan: etonogestrel (NEXPLANON) 68 MG IMPL implant   Meds ordered this encounter  Medications   etonogestrel (NEXPLANON) 68 MG IMPL implant    Sig: 1 each (68 mg total) by  Subdermal route once for 1 dose.    Dispense:  1 each    Refill:  0    Order Specific Question:   Supervising Provider    Answer:   CONSTANT, Shield[place]     Plan:   She was told to remove the dressing in 12-24 hours, to keep the incision area dry for 24 hours and to remove the Steristrip in 2-3  days.  Notify us if any signs of tenderness, redness, pain, or fevers develop.   Chenita Ruda B. Willy Pinkerton, PA-C 12/24/2021 12:10 PM

## 2021-12-24 NOTE — Patient Instructions (Signed)
I value your feedback and you entrusting us with your care. If you get a [facility] patient survey, I would appreciate you taking the time to let us know about your experience today. Thank you!  Remove the dressing in 24 hours,  keep the incision area dry for 24 hours and remove the Steristrip in 2-3  days.  Notify us if any signs of tenderness, redness, pain, or fevers develop.   

## 2021-12-28 ENCOUNTER — Telehealth (INDEPENDENT_AMBULATORY_CARE_PROVIDER_SITE_OTHER): Payer: Managed Care, Other (non HMO) | Admitting: Family Medicine

## 2021-12-28 ENCOUNTER — Encounter: Payer: Self-pay | Admitting: Family Medicine

## 2021-12-28 DIAGNOSIS — W57XXXA Bitten or stung by nonvenomous insect and other nonvenomous arthropods, initial encounter: Secondary | ICD-10-CM

## 2021-12-28 DIAGNOSIS — F411 Generalized anxiety disorder: Secondary | ICD-10-CM | POA: Diagnosis not present

## 2021-12-28 MED ORDER — FLUOXETINE HCL 20 MG PO CAPS: 60.0000 mg | ORAL_CAPSULE | ORAL | 0 refills | Status: DC

## 2021-12-28 NOTE — Progress Notes (Signed)
There were no vitals taken for this visit.   Subjective:    Patient ID: Theresa Roberts, female    DOB: [birth year], 41 y.o.   MRN: [id number]  HPI: Theresa Roberts is a 41 y.o. female  Chief Complaint  Patient presents with   Anxiety   Post-Traumatic Stress Disorder   ANXIETY/STRESS Duration: chronic Status:uncontrolled Anxious mood: no  Excessive worrying: no Irritability: yes  Sweating: no Nausea: no Palpitations:no Hyperventilation: no Panic attacks: no Agoraphobia: no  Obscessions/compulsions: no Depressed mood: no    12/28/2021    3:23 PM 11/30/2021    4:17 PM 09/01/2021   11:07 AM 07/24/2021    2:01 PM 06/19/2021    1:15 PM  Depression screen PHQ 2/9  Decreased Interest 0 0 _0 Down, Depressed, Hopeless 0 0 0 1 0  PHQ - 2 Score 0 0 _1 Altered sleeping 0 1 0 1 0  Tired, decreased energy _2 0  Change in appetite 0 0 0 0 0  Feeling bad or failure about yourself  0 0 0 0 0  Trouble concentrating _3 Moving slowly or fidgety/restless _4 0  Suicidal thoughts 0 0 0 1 0  PHQ-9 Score _5 Difficult doing work/chores   Somewhat difficult        12/28/2021    3:24 PM 11/30/2021    4:17 PM 09/01/2021   11:08 AM 07/24/2021    2:01 PM  GAD 7 : Generalized Anxiety Score  Nervous, Anxious, on Edge 0 _6 Control/stop worrying 0 _7 Worry too much - different things 0 _8 Trouble relaxing _9 Restless 0 _10 Easily annoyed or irritable 0 _11 Afraid - awful might happen 0 0 0 0  Total GAD 7 Score _12 Anxiety Difficulty  Extremely difficult Somewhat difficult Very difficult   Anhedonia: no Weight changes: no Insomnia: no   Hypersomnia: no Fatigue/loss of energy: no Feelings of worthlessness: no Feelings of guilt: no Impaired concentration/indecisiveness: yes Suicidal ideations: no  Crying spells: no Recent Stressors/Life Changes: no   Relationship problems: no   Family stress: no     Financial  stress: no    Job stress: no    Recent death/loss: no  Relevant past medical, surgical, family and social history reviewed and updated as indicated. Interim medical history since our last visit reviewed. Allergies and medications reviewed and updated.  Review of Systems  Constitutional: Negative.   Respiratory: Negative.    Cardiovascular: Negative.   Gastrointestinal: Negative.   Skin: Negative.   Neurological: Negative.   Psychiatric/Behavioral:  Positive for decreased concentration. Negative for agitation, behavioral problems, confusion, dysphoric mood, hallucinations, self-injury, sleep disturbance and suicidal ideas. The patient is not nervous/anxious and is not hyperactive.    Per HPI unless specifically indicated above     Objective:    There were no vitals taken for this visit.  Wt Readings from Last 3 Encounters:  12/24/21 159 lb (72.1 kg)  11/30/21 159 lb 9.6 oz (72.4 kg)  09/01/21 152 lb 3.2 oz (69 kg)    Physical Exam Vitals and nursing note reviewed.  Constitutional:      General: She is not in acute distress.    Appearance: Normal appearance. She is not ill-appearing, toxic-appearing or diaphoretic.  HENT:     Head: Normocephalic and atraumatic.     Right Ear: External ear normal.     Left Ear: External ear normal.     Nose: Nose normal.     Mouth/Throat:     Mouth: Mucous membranes are moist.     Pharynx: Oropharynx is clear.  Eyes:     General: No scleral icterus.       Right eye: No discharge.        Left eye: No discharge.     Conjunctiva/sclera: Conjunctivae normal.     Pupils: Pupils are equal, round, and reactive to light.  Pulmonary:     Effort: Pulmonary effort is normal. No respiratory distress.     Comments: Speaking in full sentences Musculoskeletal:        General: Normal range of motion.     Cervical back: Normal range of motion.  Skin:    Coloration: Skin is not jaundiced or pale.     Findings: No bruising, erythema, lesion or rash.   Neurological:     Mental Status: She is alert and oriented to person, place, and time. Mental status is at baseline.  Psychiatric:        Mood and Affect: Mood normal.        Behavior: Behavior normal.        Thought Content: Thought content normal.        Judgment: Judgment normal.    Results for orders placed or performed in visit on 10/17/20  WET PREP FOR [place], YEAST, CLUE   Specimen: Sterile Swab   Sterile Swab  Result Value Ref Range   Trichomonas Exam Negative Negative   Yeast Exam Negative Negative   Clue Cell Exam Positive (A) Negative  Microscopic Examination   Urine  Result Value Ref Range   WBC, UA None seen 0 - 5 /hpf   RBC 3-10 (A) 0 - 2 /hpf   Epithelial Cells (non renal) 0-10 0 - 10 /hpf   Bacteria, UA None seen None seen/Few  CBC with Differential/Platelet  Result Value Ref Range   WBC 9.3 3.4 - 10.8 x10E3/uL   RBC 4.07 3.77 - 5.28 x10E6/uL   Hemoglobin 13.0 11.1 - 15.9 g/dL   Hematocrit 38.0 34.0 - 46.6 %   MCV 93 79 - 97 fL   MCH 31.9 26.6 - 33.0 pg   MCHC 34.2 31.5 - 35.7 g/dL   RDW 11.8 11.7 - 15.4 %   Platelets 302 150 - 450 x10E3/uL   Neutrophils 70 Not Estab. %   Lymphs 20 Not Estab. %   Monocytes 8 Not Estab. %   Eos 1 Not Estab. %   Basos 1 Not Estab. %   Neutrophils Absolute 6.4 1.4 - 7.0 x10E3/uL   Lymphocytes Absolute 1.9 0.7 - 3.1 x10E3/uL   Monocytes Absolute 0.8 0.1 - 0.9 x10E3/uL   EOS (ABSOLUTE) 0.1 0.0 - 0.4 x10E3/uL   Basophils Absolute 0.1 0.0 - 0.2 x10E3/uL   Immature Granulocytes 0 Not Estab. %   Immature Grans (Abs) 0.0 0.0 - 0.1 x10E3/uL  Comprehensive metabolic panel  Result Value Ref Range   Glucose 84 65 - 99 mg/dL   BUN 11 6 - 20 mg/dL   Creatinine, Ser 0.71 0.57 - 1.00 mg/dL   eGFR 112 >59 mL/min/1.73   BUN/Creatinine Ratio 15 9 - 23   Sodium 140 134 - 144 mmol/L   Potassium 3.9 3.5 - 5.2 mmol/L   Chloride 97 96 - 106 mmol/L  CO2 21 20 - 29 mmol/L   Calcium 8.9 8.7 - 10.2 mg/dL   Total Protein 7.0 6.0 - 8.5  g/dL   Albumin 4.6 3.8 - 4.8 g/dL   Globulin, Total 2.4 1.5 - 4.5 g/dL   Albumin/Globulin Ratio 1.9 1.2 - 2.2   Bilirubin Total 0.4 0.0 - 1.2 mg/dL   Alkaline Phosphatase 55 44 - 121 IU/L   AST 22 0 - 40 IU/L   ALT 17 0 - 32 IU/L  Lipid Panel w/o Chol/HDL Ratio  Result Value Ref Range   Cholesterol, Total 171 100 - 199 mg/dL   Triglycerides 74 0 - 149 mg/dL   HDL 80 >39 mg/dL   VLDL Cholesterol Cal 14 5 - 40 mg/dL   LDL Chol Calc (NIH) 77 0 - 99 mg/dL  Urinalysis, Routine w reflex microscopic  Result Value Ref Range   Specific Gravity, UA 1.010 1.005 - 1.030   pH, UA 6.0 5.0 - 7.5   Color, UA Yellow Yellow   Appearance Ur Clear Clear   Leukocytes,UA Negative Negative   Protein,UA Negative Negative/Trace   Glucose, UA Negative Negative   Ketones, UA Negative Negative   RBC, UA 1+ (A) Negative   Bilirubin, UA Negative Negative   Urobilinogen, Ur 0.2 0.2 - 1.0 mg/dL   Nitrite, UA Negative Negative   Microscopic Examination See below:   TSH  Result Value Ref Range   TSH 0.922 0.450 - 4.500 uIU/mL  [id number] 11+Oxyco+Alc+Crt-Bund  Result Value Ref Range   Ethanol Negative Cutoff=0.020 %   Amphetamines, Urine Negative Cutoff=1000 ng/mL   Barbiturate Negative Cutoff=200 ng/mL   BENZODIAZ UR QL Negative Cutoff=200 ng/mL   Cannabinoid Quant, Ur Negative Cutoff=50 ng/mL   Cocaine (Metabolite) Negative Cutoff=300 ng/mL   OPIATE SCREEN URINE Negative Cutoff=300 ng/mL   Oxycodone/Oxymorphone, Urine Negative Cutoff=300 ng/mL   Phencyclidine Negative Cutoff=25 ng/mL   Methadone Screen, Urine Negative Cutoff=300 ng/mL   Propoxyphene Negative Cutoff=300 ng/mL   Meperidine Negative Cutoff=200 ng/mL   Tramadol Negative Cutoff=200 ng/mL   Creatinine 65.2 20.0 - 300.0 mg/dL   pH, Urine 5.8 4.5 - 8.9      Assessment & Plan:   Problem List Items Addressed This Visit       Other   GAD (generalized anxiety disorder) - Primary    Doing better, but still feeling jittery. Will get her  into a different counselor to consider CBT. Continue to titrate up weekly prozac as tolerated. Call with any concerns. Continue to monitor.        Relevant Medications   FLUoxetine (PROZAC) 20 MG capsule   Other Visit Diagnoses     Tick bite, unspecified site, initial encounter       Just happened yesterday. Call if she starts having symptoms        Follow up plan: Return in about 2 months (around 02/27/2022).    This visit was completed via video visit through MyChart due to the restrictions of the COVID-19 pandemic. All issues as above were discussed and addressed. Physical exam was done as above through visual confirmation on video through MyChart. If it was felt that the patient should be evaluated in the office, they were directed there. The patient verbally consented to this visit. Location of the patient: home Location of the provider: work Those involved with this call:  Provider: Park Liter, DO CMA: Louanna Raw, CMA Front Desk/Registration: [employer]  Time spent on call:  15 minutes with patient face to face via video  conference. More than 50% of this time was spent in counseling and coordination of care. 23 minutes total spent in review of patient's record and preparation of their chart.

## 2021-12-28 NOTE — Assessment & Plan Note (Addendum)
Doing better, but still feeling jittery. Will get her into a different counselor to consider CBT. Continue to titrate up weekly prozac as tolerated. Call with any concerns. Continue to monitor.

## 2021-12-30 NOTE — Progress Notes (Signed)
Patient scheduled.

## 2021-12-31 ENCOUNTER — Ambulatory Visit: Payer: Managed Care, Other (non HMO) | Admitting: Psychology

## 2022-01-05 ENCOUNTER — Encounter: Payer: Managed Care, Other (non HMO) | Attending: Psychology | Admitting: Psychology

## 2022-01-05 ENCOUNTER — Encounter: Payer: Self-pay | Admitting: Psychology

## 2022-01-05 DIAGNOSIS — R41840 Attention and concentration deficit: Secondary | ICD-10-CM | POA: Insufficient documentation

## 2022-01-05 DIAGNOSIS — F411 Generalized anxiety disorder: Secondary | ICD-10-CM | POA: Diagnosis present

## 2022-01-05 DIAGNOSIS — F4312 Post-traumatic stress disorder, chronic: Secondary | ICD-10-CM | POA: Insufficient documentation
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
## 2022-01-28 ENCOUNTER — Ambulatory Visit: Payer: Managed Care, Other (non HMO) | Admitting: Family Medicine

## 2022-01-28 ENCOUNTER — Encounter: Payer: Self-pay | Admitting: Family Medicine

## 2022-01-28 VITALS — BP 88/58 | HR 76 | Temp 98.3°F | Wt 152.4 lb

## 2022-01-28 DIAGNOSIS — N760 Acute vaginitis: Secondary | ICD-10-CM | POA: Diagnosis not present

## 2022-01-28 DIAGNOSIS — B3731 Acute candidiasis of vulva and vagina: Secondary | ICD-10-CM

## 2022-01-28 DIAGNOSIS — N898 Other specified noninflammatory disorders of vagina: Secondary | ICD-10-CM

## 2022-01-28 DIAGNOSIS — B9689 Other specified bacterial agents as the cause of diseases classified elsewhere: Secondary | ICD-10-CM | POA: Diagnosis not present

## 2022-01-28 LAB — MICROSCOPIC EXAMINATION: RBC, Urine: NONE SEEN /hpf (ref 0–2)

## 2022-01-28 LAB — WET PREP FOR TRICH, YEAST, CLUE
Clue Cell Exam: POSITIVE — AB
Trichomonas Exam: NEGATIVE
Yeast Exam: POSITIVE — AB

## 2022-01-28 LAB — URINALYSIS, ROUTINE W REFLEX MICROSCOPIC
Bilirubin, UA: NEGATIVE
Glucose, UA: NEGATIVE
Ketones, UA: NEGATIVE
Nitrite, UA: NEGATIVE
Protein,UA: NEGATIVE
RBC, UA: NEGATIVE
Specific Gravity, UA: 1.025 (ref 1.005–1.030)
Urobilinogen, Ur: 0.2 mg/dL (ref 0.2–1.0)
pH, UA: 5.5 (ref 5.0–7.5)

## 2022-01-28 MED ORDER — METRONIDAZOLE 500 MG PO TABS: 500.0000 mg | ORAL_TABLET | Freq: Two times a day (BID) | ORAL | 0 refills | Status: DC

## 2022-01-28 MED ORDER — FLUCONAZOLE 150 MG PO TABS: 150.0000 mg | ORAL_TABLET | Freq: Every day | ORAL | 0 refills | Status: DC

## 2022-01-28 NOTE — Progress Notes (Signed)
BP (!) 88/58   Pulse 76   Temp 98.3 F (36.8 C)   Wt 152 lb 6.4 oz (69.1 kg)   SpO2 98%   BMI 25.36 kg/m    Subjective:    Patient ID: Theresa Roberts, female    DOB: 08-Jun-1981, 41 y.o.   MRN: [id number]  HPI: Theresa Roberts is a 41 y.o. female  Chief Complaint  Patient presents with   Vaginal Discharge    Patient states she has vaginal discharge and vaginal discomfort for about a week    VAGINAL DISCHARGE Duration: days Discharge description: white  Pruritus: yes Dysuria: yes Malodorous: yes Urinary frequency: no Fevers: no Abdominal pain: no  History of sexually transmitted diseases: no Recent antibiotic use: no Context: worse  Treatments attempted:  probiotic  Relevant past medical, surgical, family and social history reviewed and updated as indicated. Interim medical history since our last visit reviewed. Allergies and medications reviewed and updated.  Review of Systems  Constitutional: Negative.   Respiratory: Negative.    Cardiovascular: Negative.   Gastrointestinal: Negative.   Genitourinary:  Positive for vaginal discharge. Negative for decreased urine volume, difficulty urinating, dyspareunia, dysuria, enuresis, flank pain, frequency, genital sores, hematuria, menstrual problem, pelvic pain, urgency, vaginal bleeding and vaginal pain.  Musculoskeletal: Negative.   Neurological: Negative.   Psychiatric/Behavioral: Negative.      Per HPI unless specifically indicated above     Objective:    BP (!) 88/58   Pulse 76   Temp 98.3 F (36.8 C)   Wt 152 lb 6.4 oz (69.1 kg)   SpO2 98%   BMI 25.36 kg/m   Wt Readings from Last 3 Encounters:  01/28/22 152 lb 6.4 oz (69.1 kg)  12/24/21 159 lb (72.1 kg)  11/30/21 159 lb 9.6 oz (72.4 kg)    Physical Exam Vitals and nursing note reviewed.  Constitutional:      General: She is not in acute distress.    Appearance: Normal appearance. She is normal weight. She is not ill-appearing, toxic-appearing  or diaphoretic.  HENT:     Head: Normocephalic and atraumatic.     Right Ear: External ear normal.     Left Ear: External ear normal.     Nose: Nose normal.     Mouth/Throat:     Mouth: Mucous membranes are moist.     Pharynx: Oropharynx is clear.  Eyes:     General: No scleral icterus.       Right eye: No discharge.        Left eye: No discharge.     Extraocular Movements: Extraocular movements intact.     Conjunctiva/sclera: Conjunctivae normal.     Pupils: Pupils are equal, round, and reactive to light.  Cardiovascular:     Rate and Rhythm: Normal rate and regular rhythm.     Pulses: Normal pulses.     Heart sounds: Normal heart sounds. No murmur heard.    No friction rub. No gallop.  Pulmonary:     Effort: Pulmonary effort is normal. No respiratory distress.     Breath sounds: Normal breath sounds. No stridor. No wheezing, rhonchi or rales.  Chest:     Chest wall: No tenderness.  Musculoskeletal:        General: Normal range of motion.     Cervical back: Normal range of motion and neck supple.  Skin:    General: Skin is warm and dry.     Capillary Refill: Capillary refill takes less than  2 seconds.     Coloration: Skin is not jaundiced or pale.     Findings: No bruising, erythema, lesion or rash.  Neurological:     General: No focal deficit present.     Mental Status: She is alert and oriented to person, place, and time. Mental status is at baseline.  Psychiatric:        Mood and Affect: Mood normal.        Behavior: Behavior normal.        Thought Content: Thought content normal.        Judgment: Judgment normal.     Results for orders placed or performed in visit on 10/17/20  WET PREP FOR [place], YEAST, CLUE   Specimen: Sterile Swab   Sterile Swab  Result Value Ref Range   Trichomonas Exam Negative Negative   Yeast Exam Negative Negative   Clue Cell Exam Positive (A) Negative  Microscopic Examination   Urine  Result Value Ref Range   WBC, UA None seen 0 -  5 /hpf   RBC 3-10 (A) 0 - 2 /hpf   Epithelial Cells (non renal) 0-10 0 - 10 /hpf   Bacteria, UA None seen None seen/Few  CBC with Differential/Platelet  Result Value Ref Range   WBC 9.3 3.4 - 10.8 x10E3/uL   RBC 4.07 3.77 - 5.28 x10E6/uL   Hemoglobin 13.0 11.1 - 15.9 g/dL   Hematocrit 38.0 34.0 - 46.6 %   MCV 93 79 - 97 fL   MCH 31.9 26.6 - 33.0 pg   MCHC 34.2 31.5 - 35.7 g/dL   RDW 11.8 11.7 - 15.4 %   Platelets 302 150 - 450 x10E3/uL   Neutrophils 70 Not Estab. %   Lymphs 20 Not Estab. %   Monocytes 8 Not Estab. %   Eos 1 Not Estab. %   Basos 1 Not Estab. %   Neutrophils Absolute 6.4 1.4 - 7.0 x10E3/uL   Lymphocytes Absolute 1.9 0.7 - 3.1 x10E3/uL   Monocytes Absolute 0.8 0.1 - 0.9 x10E3/uL   EOS (ABSOLUTE) 0.1 0.0 - 0.4 x10E3/uL   Basophils Absolute 0.1 0.0 - 0.2 x10E3/uL   Immature Granulocytes 0 Not Estab. %   Immature Grans (Abs) 0.0 0.0 - 0.1 x10E3/uL  Comprehensive metabolic panel  Result Value Ref Range   Glucose 84 65 - 99 mg/dL   BUN 11 6 - 20 mg/dL   Creatinine, Ser 0.71 0.57 - 1.00 mg/dL   eGFR 112 >59 mL/min/1.73   BUN/Creatinine Ratio 15 9 - 23   Sodium 140 134 - 144 mmol/L   Potassium 3.9 3.5 - 5.2 mmol/L   Chloride 97 96 - 106 mmol/L   CO2 21 20 - 29 mmol/L   Calcium 8.9 8.7 - 10.2 mg/dL   Total Protein 7.0 6.0 - 8.5 g/dL   Albumin 4.6 3.8 - 4.8 g/dL   Globulin, Total 2.4 1.5 - 4.5 g/dL   Albumin/Globulin Ratio 1.9 1.2 - 2.2   Bilirubin Total 0.4 0.0 - 1.2 mg/dL   Alkaline Phosphatase 55 44 - 121 IU/L   AST 22 0 - 40 IU/L   ALT 17 0 - 32 IU/L  Lipid Panel w/o Chol/HDL Ratio  Result Value Ref Range   Cholesterol, Total 171 100 - 199 mg/dL   Triglycerides 74 0 - 149 mg/dL   HDL 80 >39 mg/dL   VLDL Cholesterol Cal 14 5 - 40 mg/dL   LDL Chol Calc (NIH) 77 0 - 99 mg/dL  Urinalysis, Routine  w reflex microscopic  Result Value Ref Range   Specific Gravity, UA 1.010 1.005 - 1.030   pH, UA 6.0 5.0 - 7.5   Color, UA Yellow Yellow   Appearance Ur Clear  Clear   Leukocytes,UA Negative Negative   Protein,UA Negative Negative/Trace   Glucose, UA Negative Negative   Ketones, UA Negative Negative   RBC, UA 1+ (A) Negative   Bilirubin, UA Negative Negative   Urobilinogen, Ur 0.2 0.2 - 1.0 mg/dL   Nitrite, UA Negative Negative   Microscopic Examination See below:   TSH  Result Value Ref Range   TSH 0.922 0.450 - 4.500 uIU/mL  [id number] 11+Oxyco+Alc+Crt-Bund  Result Value Ref Range   Ethanol Negative Cutoff=0.020 %   Amphetamines, Urine Negative Cutoff=1000 ng/mL   Barbiturate Negative Cutoff=200 ng/mL   BENZODIAZ UR QL Negative Cutoff=200 ng/mL   Cannabinoid Quant, Ur Negative Cutoff=50 ng/mL   Cocaine (Metabolite) Negative Cutoff=300 ng/mL   OPIATE SCREEN URINE Negative Cutoff=300 ng/mL   Oxycodone/Oxymorphone, Urine Negative Cutoff=300 ng/mL   Phencyclidine Negative Cutoff=25 ng/mL   Methadone Screen, Urine Negative Cutoff=300 ng/mL   Propoxyphene Negative Cutoff=300 ng/mL   Meperidine Negative Cutoff=200 ng/mL   Tramadol Negative Cutoff=200 ng/mL   Creatinine 65.2 20.0 - 300.0 mg/dL   pH, Urine 5.8 4.5 - 8.9      Assessment & Plan:   Problem List Items Addressed This Visit   None Visit Diagnoses     BV (bacterial vaginosis)    -  Primary   Will treat with flagyl. Call with any concerns.    Relevant Medications   metroNIDAZOLE (FLAGYL) 500 MG tablet   fluconazole (DIFLUCAN) 150 MG tablet   Yeast vaginitis       Will treat with diflucan. Call with any concerns.    Relevant Medications   metroNIDAZOLE (FLAGYL) 500 MG tablet   fluconazole (DIFLUCAN) 150 MG tablet   Vaginal discharge       + BV and yeast   Relevant Orders   WET PREP FOR [place], YEAST, CLUE   Urinalysis, Routine w reflex microscopic        Follow up plan: Return if symptoms worsen or fail to improve.

## 2022-02-11 ENCOUNTER — Encounter: Payer: Self-pay | Admitting: Family Medicine

## 2022-02-12 ENCOUNTER — Encounter: Payer: Self-pay | Admitting: Family Medicine

## 2022-02-26 ENCOUNTER — Encounter: Payer: Self-pay | Admitting: Family Medicine

## 2022-02-26 ENCOUNTER — Ambulatory Visit: Payer: Managed Care, Other (non HMO) | Admitting: Family Medicine

## 2022-02-26 VITALS — BP 95/59 | HR 77 | Temp 98.9°F | Wt 155.7 lb

## 2022-02-26 DIAGNOSIS — M5441 Lumbago with sciatica, right side: Secondary | ICD-10-CM

## 2022-02-26 DIAGNOSIS — N898 Other specified noninflammatory disorders of vagina: Secondary | ICD-10-CM

## 2022-02-26 DIAGNOSIS — G8929 Other chronic pain: Secondary | ICD-10-CM | POA: Diagnosis not present

## 2022-02-26 DIAGNOSIS — R109 Unspecified abdominal pain: Secondary | ICD-10-CM

## 2022-02-26 LAB — URINALYSIS, ROUTINE W REFLEX MICROSCOPIC
Bilirubin, UA: NEGATIVE
Glucose, UA: NEGATIVE
Ketones, UA: NEGATIVE
Nitrite, UA: NEGATIVE
Protein,UA: NEGATIVE
RBC, UA: NEGATIVE
Specific Gravity, UA: 1.01 (ref 1.005–1.030)
Urobilinogen, Ur: 0.2 mg/dL (ref 0.2–1.0)
pH, UA: 6 (ref 5.0–7.5)

## 2022-02-26 LAB — MICROSCOPIC EXAMINATION: RBC, Urine: NONE SEEN /hpf (ref 0–2)

## 2022-02-26 LAB — WET PREP FOR TRICH, YEAST, CLUE
Clue Cell Exam: NEGATIVE
Trichomonas Exam: NEGATIVE
Yeast Exam: NEGATIVE

## 2022-02-26 MED ORDER — OXYCODONE-ACETAMINOPHEN 5-325 MG PO TABS: 1.0000 | ORAL_TABLET | Freq: Two times a day (BID) | ORAL | 0 refills | Status: DC | PRN

## 2022-02-26 MED ORDER — CYCLOBENZAPRINE HCL 10 MG PO TABS
10.0000 mg | ORAL_TABLET | Freq: Every day | ORAL | 1 refills | Status: DC
Start: 2022-02-26 — End: 2022-11-30

## 2022-02-26 NOTE — Progress Notes (Signed)
BP (!) 95/59   Pulse 77   Temp 98.9 F (37.2 C)   Wt 155 lb 11.2 oz (70.6 kg)   SpO2 98%   BMI 25.91 kg/m    Subjective:    Patient ID: Theresa Roberts, female    DOB: Aug 14, 1980, 41 y.o.   MRN: [id number]  HPI: Theresa Roberts is a 41 y.o. female  Chief Complaint  Patient presents with   Back Pain   vaginal concern    Patient states since last visit she still feels like something is off. Patient has noticed some discharge.    CHRONIC PAIN  Pain control status: stable Duration: chronic Location: low back Quality: aching and sore Current Pain Level: mild Previous Pain Level: moderate Breakthrough pain: no Benefit from narcotic medications: yes What Activities task can be accomplished with current medication? Able to work and do her ADLs Interested in weaning off narcotics:no   Stool softners/OTC fiber: no  Previous pain specialty evaluation: yes Non-narcotic analgesic meds: no Narcotic contract: yes  VAGINAL DISCHARGE Duration: about a month Discharge description: clear  Pruritus: no Dysuria: yes Malodorous: no Urinary frequency: no Fevers: no Abdominal pain: no  Sexual activity: practicing safe sex History of sexually transmitted diseases: no Recent antibiotic use: no Context: better  Treatments attempted: flagyl and diflucan  Relevant past medical, surgical, family and social history reviewed and updated as indicated. Interim medical history since our last visit reviewed. Allergies and medications reviewed and updated.  Review of Systems  Constitutional: Negative.   Respiratory: Negative.    Cardiovascular: Negative.   Gastrointestinal: Negative.   Musculoskeletal:  Positive for back pain and myalgias. Negative for arthralgias, gait problem, joint swelling, neck pain and neck stiffness.       Different back pain on the L in the AM  Skin: Negative.   Psychiatric/Behavioral: Negative.      Per HPI unless specifically indicated above      Objective:    BP (!) 95/59   Pulse 77   Temp 98.9 F (37.2 C)   Wt 155 lb 11.2 oz (70.6 kg)   SpO2 98%   BMI 25.91 kg/m   Wt Readings from Last 3 Encounters:  02/26/22 155 lb 11.2 oz (70.6 kg)  01/28/22 152 lb 6.4 oz (69.1 kg)  12/24/21 159 lb (72.1 kg)    Physical Exam Vitals and nursing note reviewed.  Constitutional:      General: She is not in acute distress.    Appearance: Normal appearance. She is not ill-appearing, toxic-appearing or diaphoretic.  HENT:     Head: Normocephalic and atraumatic.     Right Ear: External ear normal.     Left Ear: External ear normal.     Nose: Nose normal.     Mouth/Throat:     Mouth: Mucous membranes are moist.     Pharynx: Oropharynx is clear.  Eyes:     General: No scleral icterus.       Right eye: No discharge.        Left eye: No discharge.     Extraocular Movements: Extraocular movements intact.     Conjunctiva/sclera: Conjunctivae normal.     Pupils: Pupils are equal, round, and reactive to light.  Cardiovascular:     Rate and Rhythm: Normal rate and regular rhythm.     Pulses: Normal pulses.     Heart sounds: Normal heart sounds. No murmur heard.    No friction rub. No gallop.  Pulmonary:  Effort: Pulmonary effort is normal. No respiratory distress.     Breath sounds: Normal breath sounds. No stridor. No wheezing, rhonchi or rales.  Chest:     Chest wall: No tenderness.  Musculoskeletal:        General: Tenderness (L paraspinals) present. Normal range of motion.     Cervical back: Normal range of motion and neck supple.  Skin:    General: Skin is warm and dry.     Capillary Refill: Capillary refill takes less than 2 seconds.     Coloration: Skin is not jaundiced or pale.     Findings: No bruising, erythema, lesion or rash.  Neurological:     General: No focal deficit present.     Mental Status: She is alert and oriented to person, place, and time. Mental status is at baseline.  Psychiatric:        Mood and  Affect: Mood normal.        Behavior: Behavior normal.        Thought Content: Thought content normal.        Judgment: Judgment normal.     Results for orders placed or performed in visit on 01/28/22  Microscopic Examination  Result Value Ref Range   WBC, UA 6-10 (A) 0 - 5 /hpf   RBC, Urine None seen 0 - 2 /hpf   Epithelial Cells (non renal) 0-10 0 - 10 /hpf   Mucus, UA Present (A) Not Estab.   Bacteria, UA Moderate (A) None seen/Few   Yeast, UA Present (A) None seen  WET PREP FOR TRICH, YEAST, CLUE   Urine  Result Value Ref Range   Trichomonas Exam Negative Negative   Yeast Exam Positive (A) Negative   Clue Cell Exam Positive (A) Negative  Urinalysis, Routine w reflex microscopic  Result Value Ref Range   Specific Gravity, UA 1.025 1.005 - 1.030   pH, UA 5.5 5.0 - 7.5   Color, UA Yellow Yellow   Appearance Ur Cloudy (A) Clear   Leukocytes,UA 1+ (A) Negative   Protein,UA Negative Negative/Trace   Glucose, UA Negative Negative   Ketones, UA Negative Negative   RBC, UA Negative Negative   Bilirubin, UA Negative Negative   Urobilinogen, Ur 0.2 0.2 - 1.0 mg/dL   Nitrite, UA Negative Negative   Microscopic Examination See below:       Assessment & Plan:   Problem List Items Addressed This Visit       Nervous and Auditory   Chronic midline low back pain with right-sided sciatica - Primary    Under good control on current regimen. Continue current regimen. Continue to monitor. Call with any concerns. Refills given. Rx should last 3+ months.        Relevant Medications   cyclobenzaprine (FLEXERIL) 10 MG tablet   oxyCODONE-acetaminophen (PERCOCET/ROXICET) 5-325 MG tablet   Other Visit Diagnoses     Left flank pain       Likely muscular. Await results of urine culture and BMP. Call with any concerns.    Relevant Orders   Basic metabolic panel   Urine Culture   Vaginal discharge       Negative yeast and BV. Will check urine culture. Await results.    Relevant  Orders   Urinalysis, Routine w reflex microscopic   WET PREP FOR TRICH, YEAST, CLUE        Follow up plan: Return in about 3 months (around 05/29/2022).

## 2022-02-26 NOTE — Assessment & Plan Note (Signed)
Under good control on current regimen. Continue current regimen. Continue to monitor. Call with any concerns. Refills given. Rx should last 3+ months.

## 2022-02-27 LAB — BASIC METABOLIC PANEL
BUN/Creatinine Ratio: 16 (ref 9–23)
BUN: 13 mg/dL (ref 6–20)
CO2: 24 mmol/L (ref 20–29)
Calcium: 9.2 mg/dL (ref 8.7–10.2)
Chloride: 100 mmol/L (ref 96–106)
Creatinine, Ser: 0.8 mg/dL (ref 0.57–1.00)
Glucose: 98 mg/dL (ref 70–99)
Potassium: 3.7 mmol/L (ref 3.5–5.2)
Sodium: 139 mmol/L (ref 134–144)
eGFR: 97 mL/min/{1.73_m2} (ref >59)

## 2022-02-28 LAB — URINE CULTURE

## 2022-02-28 NOTE — Progress Notes (Signed)
Contacted via MyChart   Good afternoon Theresa Roberts, your urine has returned and no infection is noted. Have a great day!!

## 2022-03-01 ENCOUNTER — Telehealth: Payer: Managed Care, Other (non HMO) | Admitting: Family Medicine

## 2022-03-01 ENCOUNTER — Telehealth: Payer: Self-pay | Admitting: Family Medicine

## 2022-03-01 NOTE — Telephone Encounter (Signed)
Copied from [place] [id number]. Topic: General - Other >> Mar 01, 2022 11:27 AM Everette C wrote: Reason for CRM: The patient has called to request a latter from their PCP that states that they are a non-smoker  The patient shares that they've never smoked a cigarette before and are being required to provide documentation from their PCP that states expressly that they are a non smoker for their insurance   Please contact further when possible

## 2022-03-02 ENCOUNTER — Encounter: Payer: Self-pay | Admitting: Family Medicine

## 2022-03-02 NOTE — Telephone Encounter (Signed)
Letter sent via mychart, if she needs it printed, please do.

## 2022-03-02 NOTE — Telephone Encounter (Signed)
Patient is aware of letter, no further questions.

## 2022-03-03 ENCOUNTER — Other Ambulatory Visit: Payer: Self-pay | Admitting: Family Medicine

## 2022-03-04 NOTE — Telephone Encounter (Signed)
D/C 11/30/21. Requested Prescriptions  Refused Prescriptions Disp Refills  . sertraline (ZOLOFT) 50 MG tablet [Pharmacy Med Name: SERTRALINE '50MG'$  TABLETS] 90 tablet 1    Sig: TAKE 1 TABLET BY MOUTH DAILY     Psychiatry:  Antidepressants - SSRI - sertraline Failed - 03/03/2022 10:10 AM      Failed - AST in normal range and within 360 days    AST  Date Value Ref Range Status  10/17/2020 22 0 - 40 IU/L Final         Failed - ALT in normal range and within 360 days    ALT  Date Value Ref Range Status  10/17/2020 17 0 - 32 IU/L Final         Passed - Completed PHQ-2 or PHQ-9 in the last 360 days      Passed - Valid encounter within last 6 months    Recent Outpatient Visits          6 days ago Chronic midline low back pain with right-sided sciatica   Martinez, Megan P, DO   1 month ago BV (bacterial vaginosis)   [place], Megan P, DO   2 months ago GAD (generalized anxiety disorder)   [place], Megan P, DO   3 months ago GAD (generalized anxiety disorder)   [facility] [place], [place], DO   6 months ago Concentration deficit   [employer], Zebulon, DO      Future Appointments            In 2 months Johnson, Megan P, DO [employer]

## 2022-05-31 ENCOUNTER — Ambulatory Visit: Payer: Managed Care, Other (non HMO) | Admitting: Family Medicine

## 2022-05-31 ENCOUNTER — Encounter: Payer: Self-pay | Admitting: Family Medicine

## 2022-05-31 VITALS — BP 95/59 | HR 66 | Temp 98.1°F | Ht 64.0 in | Wt 162.8 lb

## 2022-05-31 DIAGNOSIS — N898 Other specified noninflammatory disorders of vagina: Secondary | ICD-10-CM | POA: Diagnosis not present

## 2022-05-31 DIAGNOSIS — Z0000 Encounter for general adult medical examination without abnormal findings: Secondary | ICD-10-CM | POA: Diagnosis not present

## 2022-05-31 DIAGNOSIS — Z79899 Other long term (current) drug therapy: Secondary | ICD-10-CM

## 2022-05-31 DIAGNOSIS — G8929 Other chronic pain: Secondary | ICD-10-CM

## 2022-05-31 DIAGNOSIS — F411 Generalized anxiety disorder: Secondary | ICD-10-CM | POA: Diagnosis not present

## 2022-05-31 DIAGNOSIS — M792 Neuralgia and neuritis, unspecified: Secondary | ICD-10-CM | POA: Diagnosis not present

## 2022-05-31 DIAGNOSIS — M5441 Lumbago with sciatica, right side: Secondary | ICD-10-CM

## 2022-05-31 MED ORDER — OXYCODONE-ACETAMINOPHEN 5-325 MG PO TABS: 1.0000 | ORAL_TABLET | Freq: Two times a day (BID) | ORAL | 0 refills | Status: DC | PRN

## 2022-05-31 MED ORDER — TRIAMCINOLONE ACETONIDE 0.5 % EX OINT: 1.0000 | TOPICAL_OINTMENT | Freq: Two times a day (BID) | CUTANEOUS | 3 refills | Status: DC

## 2022-05-31 NOTE — Assessment & Plan Note (Signed)
Under good control on current regimen. Continue current regimen. Continue to monitor. Call with any concerns. Refills given.   

## 2022-05-31 NOTE — Progress Notes (Signed)
BP (!) 95/59   Pulse 66   Temp 98.1 F (36.7 C)   Ht 5' 4"  (1.626 m)   Wt 162 lb 12.8 oz (73.8 kg)   SpO2 98%   BMI 27.94 kg/m    Subjective:    Patient ID: Theresa Roberts, female    DOB: 1981-05-17, 41 y.o.   MRN: [id number]  HPI: Theresa Roberts is a 41 y.o. female presenting on 05/31/2022 for comprehensive medical examination. Current medical complaints include:  ANXIETY/STRESS Duration: chronic Status:stable Anxious mood: yes  Excessive worrying: yes Irritability: yes  Sweating: no Nausea: no Palpitations:no Hyperventilation: no Panic attacks: no Agoraphobia: no  Obscessions/compulsions: no Depressed mood: yes    05/31/2022    4:07 PM 02/26/2022    4:23 PM 01/28/2022    3:11 PM 12/28/2021    3:23 PM 11/30/2021    4:17 PM  Depression screen PHQ 2/9  Decreased Interest 1 0 0 0 0  Down, Depressed, Hopeless 0 0 0 0 0  PHQ - 2 Score 1 0 0 0 0  Altered sleeping 0 1 0 0 1  Tired, decreased energy 1 1 1 1 1   Change in appetite 0 0 0 0 0  Feeling bad or failure about yourself  0 0 0 0 0  Trouble concentrating 2 1 2 1 3   Moving slowly or fidgety/restless 1 1 2 1 2   Suicidal thoughts 0 0 0 0 0  PHQ-9 Score 5 4 5 3 7   Difficult doing work/chores Somewhat difficult Somewhat difficult Somewhat difficult     Anhedonia: no Weight changes: no Insomnia: no   Hypersomnia: no Fatigue/loss of energy: yes Feelings of worthlessness: no Feelings of guilt: no Impaired concentration/indecisiveness: no Suicidal ideations: no  Crying spells: no Recent Stressors/Life Changes: no   Relationship problems: no   Family stress: no     Financial stress: no    Job stress: yes    Recent death/loss: no  CHRONIC PAIN  Present dose:  15 Morphine equivalents Pain control status: stable Duration: chronic Location: low back Quality: aching, shooting Current Pain Level: mild Previous Pain Level: moderate Breakthrough pain: no Benefit from narcotic medications: yes What  Activities task can be accomplished with current medication? Able to do adls and work Interested in weaning off narcotics:yes   Stool softners/OTC fiber: no  Previous pain specialty evaluation: no Non-narcotic analgesic meds: no Narcotic contract: yes  She currently lives with: kids Menopausal Symptoms: no  Depression Screen done today and results listed below:     05/31/2022    4:07 PM 02/26/2022    4:23 PM 01/28/2022    3:11 PM 12/28/2021    3:23 PM 11/30/2021    4:17 PM  Depression screen PHQ 2/9  Decreased Interest 1 0 0 0 0  Down, Depressed, Hopeless 0 0 0 0 0  PHQ - 2 Score 1 0 0 0 0  Altered sleeping 0 1 0 0 1  Tired, decreased energy 1 1 1 1 1   Change in appetite 0 0 0 0 0  Feeling bad or failure about yourself  0 0 0 0 0  Trouble concentrating 2 1 2 1 3   Moving slowly or fidgety/restless 1 1 2 1 2   Suicidal thoughts 0 0 0 0 0  PHQ-9 Score 5 4 5 3 7   Difficult doing work/chores Somewhat difficult Somewhat difficult Somewhat difficult      Past Medical History:  Past Medical History:  Diagnosis Date   Scoliosis  Surgical History:  Past Surgical History:  Procedure Laterality Date   CHOLECYSTECTOMY     PLACEMENT OF BREAST IMPLANTS      Medications:  Current Outpatient Medications on File Prior to Visit  Medication Sig   acetaminophen (TYLENOL) 650 MG CR tablet Take 650 mg by mouth every 8 (eight) hours as needed for pain.   acyclovir (ZOVIRAX) 800 MG tablet TAKE 1 TABLET(800 MG) BY MOUTH TWICE DAILY   busPIRone (BUSPAR) 7.5 MG tablet Take 7.5 mg by mouth 3 (three) times daily.   cyclobenzaprine (FLEXERIL) 10 MG tablet Take 1 tablet (10 mg total) by mouth at bedtime.   etonogestrel (NEXPLANON) 68 MG IMPL implant 1 each (68 mg total) by Subdermal route once for 1 dose.   No current facility-administered medications on file prior to visit.    Allergies:  Allergies  Allergen Reactions   Wellbutrin [Bupropion] Other (See Comments)    irritability     Social History:  Social History   Socioeconomic History   Marital status: Single    Spouse name: Not on file   Number of children: Not on file   Years of education: Not on file   Highest education level: Not on file  Occupational History   Not on file  Tobacco Use   Smoking status: Never   Smokeless tobacco: Never  Vaping Use   Vaping Use: Never used  Substance and Sexual Activity   Alcohol use: No    Alcohol/week: 0.0 standard drinks of alcohol   Drug use: No   Sexual activity: Yes    Birth control/protection: Implant  Other Topics Concern   Not on file  Social History Narrative   Not on file   Social Determinants of Health   Financial Resource Strain: Not on file  Food Insecurity: Not on file  Transportation Needs: Not on file  Physical Activity: Not on file  Stress: Not on file  Social Connections: Not on file  Intimate Partner Violence: Not on file   Social History   Tobacco Use  Smoking Status Never  Smokeless Tobacco Never   Social History   Substance and Sexual Activity  Alcohol Use No   Alcohol/week: 0.0 standard drinks of alcohol    Family History:  Family History  Problem Relation Age of Onset   Asthma Son    Cancer Maternal Grandmother        breast, malignant   Heart attack Maternal Grandfather    Diabetes Maternal Grandfather        type 2    Past medical history, surgical history, medications, allergies, family history and social history reviewed with patient today and changes made to appropriate areas of the chart.   Review of Systems  Constitutional: Negative.   HENT: Negative.    Eyes: Negative.   Respiratory: Negative.    Cardiovascular:  Positive for palpitations. Negative for chest pain, orthopnea, claudication, leg swelling and PND.  Gastrointestinal: Negative.   Genitourinary: Negative.   Musculoskeletal:  Positive for back pain and myalgias. Negative for falls, joint pain and neck pain.  Skin: Negative.    Neurological:  Positive for dizziness and tingling. Negative for tremors, sensory change, speech change, focal weakness, seizures, loss of consciousness, weakness and headaches.  Endo/Heme/Allergies:  Positive for environmental allergies. Negative for polydipsia. Does not bruise/bleed easily.  Psychiatric/Behavioral: Negative.     All other ROS negative except what is listed above and in the HPI.      Objective:    BP Marland Kitchen)  95/59   Pulse 66   Temp 98.1 F (36.7 C)   Ht 5' 4"  (1.626 m)   Wt 162 lb 12.8 oz (73.8 kg)   SpO2 98%   BMI 27.94 kg/m   Wt Readings from Last 3 Encounters:  05/31/22 162 lb 12.8 oz (73.8 kg)  02/26/22 155 lb 11.2 oz (70.6 kg)  01/28/22 152 lb 6.4 oz (69.1 kg)    Physical Exam Vitals and nursing note reviewed.  Constitutional:      General: She is not in acute distress.    Appearance: Normal appearance. She is normal weight. She is not ill-appearing, toxic-appearing or diaphoretic.  HENT:     Head: Normocephalic and atraumatic.     Right Ear: Tympanic membrane, ear canal and external ear normal. There is no impacted cerumen.     Left Ear: Tympanic membrane, ear canal and external ear normal. There is no impacted cerumen.     Nose: Nose normal. No congestion or rhinorrhea.     Mouth/Throat:     Mouth: Mucous membranes are moist.     Pharynx: Oropharynx is clear. No oropharyngeal exudate or posterior oropharyngeal erythema.  Eyes:     General: No scleral icterus.       Right eye: No discharge.        Left eye: No discharge.     Extraocular Movements: Extraocular movements intact.     Conjunctiva/sclera: Conjunctivae normal.     Pupils: Pupils are equal, round, and reactive to light.  Neck:     Vascular: No carotid bruit.  Cardiovascular:     Rate and Rhythm: Normal rate and regular rhythm.     Pulses: Normal pulses.     Heart sounds: No murmur heard.    No friction rub. No gallop.  Pulmonary:     Effort: Pulmonary effort is normal. No respiratory  distress.     Breath sounds: Normal breath sounds. No stridor. No wheezing, rhonchi or rales.  Chest:     Chest wall: No tenderness.  Abdominal:     General: Abdomen is flat. Bowel sounds are normal. There is no distension.     Palpations: Abdomen is soft. There is no mass.     Tenderness: There is no abdominal tenderness. There is no right CVA tenderness, left CVA tenderness, guarding or rebound.     Hernia: No hernia is present.  Genitourinary:    Comments: Breast and pelvic exams deferred with shared decision making Musculoskeletal:        General: No swelling, tenderness, deformity or signs of injury.     Cervical back: Normal range of motion and neck supple. No rigidity. No muscular tenderness.     Right lower leg: No edema.     Left lower leg: No edema.  Lymphadenopathy:     Cervical: No cervical adenopathy.  Skin:    General: Skin is warm and dry.     Capillary Refill: Capillary refill takes less than 2 seconds.     Coloration: Skin is not jaundiced or pale.     Findings: No bruising, erythema, lesion or rash.  Neurological:     General: No focal deficit present.     Mental Status: She is alert and oriented to person, place, and time. Mental status is at baseline.     Cranial Nerves: No cranial nerve deficit.     Sensory: No sensory deficit.     Motor: No weakness.     Coordination: Coordination normal.  Gait: Gait normal.     Deep Tendon Reflexes: Reflexes normal.  Psychiatric:        Mood and Affect: Mood normal.        Behavior: Behavior normal.        Thought Content: Thought content normal.        Judgment: Judgment normal.     Results for orders placed or performed in visit on 02/26/22  Urine Culture   Specimen: Urine   UR  Result Value Ref Range   Urine Culture, Routine Final report    Organism ID, Bacteria Comment   Microscopic Examination  Result Value Ref Range   WBC, UA 0-5 0 - 5 /hpf   RBC, Urine None seen 0 - 2 /hpf   Epithelial Cells (non  renal) 0-10 0 - 10 /hpf   Mucus, UA Present (A) Not Estab.   Bacteria, UA Moderate (A) None seen/Few  WET PREP FOR TRICH, YEAST, CLUE   Urine  Result Value Ref Range   Trichomonas Exam Negative Negative   Yeast Exam Negative Negative   Clue Cell Exam Negative Negative  Urinalysis, Routine w reflex microscopic  Result Value Ref Range   Specific Gravity, UA 1.010 1.005 - 1.030   pH, UA 6.0 5.0 - 7.5   Color, UA Yellow Yellow   Appearance Ur Cloudy (A) Clear   Leukocytes,UA 1+ (A) Negative   Protein,UA Negative Negative/Trace   Glucose, UA Negative Negative   Ketones, UA Negative Negative   RBC, UA Negative Negative   Bilirubin, UA Negative Negative   Urobilinogen, Ur 0.2 0.2 - 1.0 mg/dL   Nitrite, UA Negative Negative   Microscopic Examination See below:   Basic metabolic panel  Result Value Ref Range   Glucose 98 70 - 99 mg/dL   BUN 13 6 - 20 mg/dL   Creatinine, Ser 0.80 0.57 - 1.00 mg/dL   eGFR 97 >59 mL/min/1.73   BUN/Creatinine Ratio 16 9 - 23   Sodium 139 134 - 144 mmol/L   Potassium 3.7 3.5 - 5.2 mmol/L   Chloride 100 96 - 106 mmol/L   CO2 24 20 - 29 mmol/L   Calcium 9.2 8.7 - 10.2 mg/dL      Assessment & Plan:   Problem List Items Addressed This Visit       Nervous and Auditory   Chronic midline low back pain with right-sided sciatica    Under good control on current regimen. Continue current regimen. Continue to monitor. Call with any concerns. Refills given. Rx should last 3+ months. Follow up 3 months.        Relevant Medications   oxyCODONE-acetaminophen (PERCOCET/ROXICET) 5-325 MG tablet     Other   GAD (generalized anxiety disorder)    Under good control on current regimen. Continue current regimen. Continue to monitor. Call with any concerns. Refills given.        Other Visit Diagnoses     Routine general medical examination at a health care facility    -  Primary   Vaccines up to date/declined. Screening labs checked today. Pap up to date.  Continue diet and exercise. Call with any concerns.    Relevant Orders   CBC with Differential/Platelet   Comprehensive metabolic panel   Lipid Panel w/o Chol/HDL Ratio   Urinalysis, Routine w reflex microscopic   TSH   Vaginal discharge       Wet prep negative today.    Relevant Orders   WET PREP FOR [place],  CLUE   Peripheral neuralgia       Will check labs today. Await results. Treat as needed.    Relevant Orders   B12   Long-term use of high-risk medication       Utox today. Await results.    Relevant Orders   [id number] 11+Oxyco+Alc+Crt-Bund        Follow up plan: Return in about 3 months (around 08/31/2022).   LABORATORY TESTING:  - Pap smear: up to date  IMMUNIZATIONS:   - Tdap: Tetanus vaccination status reviewed: last tetanus booster within 10 years. - Influenza: Refused - Pneumovax: Not applicable - Prevnar: Not applicable - COVID: Refused - HPV: Refused - Shingrix vaccine: Not applicable  PATIENT COUNSELING:   Advised to take 1 mg of folate supplement per day if capable of pregnancy.   Sexuality: Discussed sexually transmitted diseases, partner selection, use of condoms, avoidance of unintended pregnancy  and contraceptive alternatives.   Advised to avoid cigarette smoking.  I discussed with the patient that most people either abstain from alcohol or drink within safe limits (<=14/week and <=4 drinks/occasion for males, <=7/weeks and <= 3 drinks/occasion for females) and that the risk for alcohol disorders and other health effects rises proportionally with the number of drinks per week and how often a drinker exceeds daily limits.  Discussed cessation/primary prevention of drug use and availability of treatment for abuse.   Diet: Encouraged to adjust caloric intake to maintain  or achieve ideal body weight, to reduce intake of dietary saturated fat and total fat, to limit sodium intake by avoiding high sodium foods and not adding table salt, and to  maintain adequate dietary potassium and calcium preferably from fresh fruits, vegetables, and low-fat dairy products.    stressed the importance of regular exercise  Injury prevention: Discussed safety belts, safety helmets, smoke detector, smoking near bedding or upholstery.   Dental health: Discussed importance of regular tooth brushing, flossing, and dental visits.    NEXT PREVENTATIVE PHYSICAL DUE IN 1 YEAR. Return in about 3 months (around 08/31/2022).

## 2022-05-31 NOTE — Assessment & Plan Note (Signed)
Under good control on current regimen. Continue current regimen. Continue to monitor. Call with any concerns. Refills given. Rx should last 3+ months. Follow up 3 months.

## 2022-06-01 LAB — URINALYSIS, ROUTINE W REFLEX MICROSCOPIC
Bilirubin, UA: NEGATIVE
Glucose, UA: NEGATIVE
Ketones, UA: NEGATIVE
Nitrite, UA: NEGATIVE
Protein,UA: NEGATIVE
RBC, UA: NEGATIVE
Specific Gravity, UA: 1.02 (ref 1.005–1.030)
Urobilinogen, Ur: 1 mg/dL (ref 0.2–1.0)
pH, UA: 7 (ref 5.0–7.5)

## 2022-06-01 LAB — CBC WITH DIFFERENTIAL/PLATELET
Basophils Absolute: 0.1 10*3/uL (ref 0.0–0.2)
Basos: 1 %
EOS (ABSOLUTE): 0.1 10*3/uL (ref 0.0–0.4)
Eos: 2 %
Hematocrit: 39.7 % (ref 34.0–46.6)
Hemoglobin: 13.1 g/dL (ref 11.1–15.9)
Immature Grans (Abs): 0 10*3/uL (ref 0.0–0.1)
Immature Granulocytes: 0 %
Lymphocytes Absolute: 2.2 10*3/uL (ref 0.7–3.1)
Lymphs: 33 %
MCH: 31.4 pg (ref 26.6–33.0)
MCHC: 33 g/dL (ref 31.5–35.7)
MCV: 95 fL (ref 79–97)
Monocytes Absolute: 0.7 10*3/uL (ref 0.1–0.9)
Monocytes: 10 %
Neutrophils Absolute: 3.7 10*3/uL (ref 1.4–7.0)
Neutrophils: 54 %
Platelets: 279 10*3/uL (ref 150–450)
RBC: 4.17 x10E6/uL (ref 3.77–5.28)
RDW: 11.7 % (ref 11.7–15.4)
WBC: 6.8 10*3/uL (ref 3.4–10.8)

## 2022-06-01 LAB — LIPID PANEL W/O CHOL/HDL RATIO
Cholesterol, Total: 174 mg/dL (ref 100–199)
HDL: 86 mg/dL (ref >39)
LDL Chol Calc (NIH): 73 mg/dL (ref 0–99)
Triglycerides: 85 mg/dL (ref 0–149)
VLDL Cholesterol Cal: 15 mg/dL (ref 5–40)

## 2022-06-01 LAB — TSH: TSH: 1.02 u[IU]/mL (ref 0.450–4.500)

## 2022-06-01 LAB — COMPREHENSIVE METABOLIC PANEL
ALT: 18 IU/L (ref 0–32)
AST: 16 IU/L (ref 0–40)
Albumin/Globulin Ratio: 2.2 (ref 1.2–2.2)
Albumin: 4.9 g/dL (ref 3.9–4.9)
Alkaline Phosphatase: 58 IU/L (ref 44–121)
BUN/Creatinine Ratio: 20 (ref 9–23)
BUN: 18 mg/dL (ref 6–20)
Bilirubin Total: <0.2 mg/dL (ref 0.0–1.2)
CO2: 21 mmol/L (ref 20–29)
Calcium: 9.5 mg/dL (ref 8.7–10.2)
Chloride: 100 mmol/L (ref 96–106)
Creatinine, Ser: 0.88 mg/dL (ref 0.57–1.00)
Globulin, Total: 2.2 g/dL (ref 1.5–4.5)
Glucose: 90 mg/dL (ref 70–99)
Potassium: 3.8 mmol/L (ref 3.5–5.2)
Sodium: 139 mmol/L (ref 134–144)
Total Protein: 7.1 g/dL (ref 6.0–8.5)
eGFR: 86 mL/min/{1.73_m2} (ref >59)

## 2022-06-01 LAB — MICROSCOPIC EXAMINATION: RBC, Urine: NONE SEEN /hpf (ref 0–2)

## 2022-06-01 LAB — DRUG SCREEN 764883 11+OXYCO+ALC+CRT-BUND
Amphetamines, Urine: NEGATIVE ng/mL
BENZODIAZ UR QL: NEGATIVE ng/mL
Barbiturate: NEGATIVE ng/mL
Cannabinoid Quant, Ur: NEGATIVE ng/mL
Cocaine (Metabolite): NEGATIVE ng/mL
Creatinine: 101.6 mg/dL (ref 20.0–300.0)
Ethanol: NEGATIVE %
Meperidine: NEGATIVE ng/mL
Methadone Screen, Urine: NEGATIVE ng/mL
OPIATE SCREEN URINE: NEGATIVE ng/mL
Oxycodone/Oxymorphone, Urine: NEGATIVE ng/mL
Phencyclidine: NEGATIVE ng/mL
Propoxyphene: NEGATIVE ng/mL
Tramadol: NEGATIVE ng/mL
pH, Urine: 7.1 (ref 4.5–8.9)

## 2022-06-01 LAB — WET PREP FOR TRICH, YEAST, CLUE
Clue Cell Exam: NEGATIVE
Trichomonas Exam: NEGATIVE
Yeast Exam: NEGATIVE

## 2022-06-01 LAB — VITAMIN B12: Vitamin B-12: 476 pg/mL (ref 232–1245)

## 2022-08-31 ENCOUNTER — Ambulatory Visit: Payer: Managed Care, Other (non HMO) | Admitting: Family Medicine

## 2022-08-31 ENCOUNTER — Encounter: Payer: Self-pay | Admitting: Family Medicine

## 2022-08-31 VITALS — BP 96/63 | HR 72 | Temp 98.4°F | Ht 64.0 in | Wt 159.5 lb

## 2022-08-31 DIAGNOSIS — G8929 Other chronic pain: Secondary | ICD-10-CM | POA: Diagnosis not present

## 2022-08-31 DIAGNOSIS — M5441 Lumbago with sciatica, right side: Secondary | ICD-10-CM

## 2022-08-31 MED ORDER — OXYCODONE-ACETAMINOPHEN 5-325 MG PO TABS: 1.0000 | ORAL_TABLET | Freq: Two times a day (BID) | ORAL | 0 refills | Status: DC | PRN

## 2022-08-31 NOTE — Progress Notes (Signed)
BP 96/63   Pulse 72   Temp 98.4 F (36.9 C) (Oral)   Ht '5\' 4"'$  (1.626 m)   Wt 159 lb 8 oz (72.3 kg)   SpO2 100%   BMI 27.38 kg/m    Subjective:    Patient ID: Theresa Roberts, female    DOB: [birth year], 42 y.o.   MRN: [id number]  HPI: Theresa Roberts is a 42 y.o. female  Chief Complaint  Patient presents with   Pain   CHRONIC PAIN  Present dose: 15 Morphine equivalents Pain control status: controlled Duration: chronic Location: low back Quality: aching and sore and shooting Current Pain Level: moderate Previous Pain Level: mild Breakthrough pain: no Benefit from narcotic medications: yes What Activities task can be accomplished with current medication? Able to do adls and work  Interested in Careers adviser off narcotics:yes   Stool softners/OTC fiber: no  Previous pain specialty evaluation: no Non-narcotic analgesic meds: no Narcotic contract: yes  Relevant past medical, surgical, family and social history reviewed and updated as indicated. Interim medical history since our last visit reviewed. Allergies and medications reviewed and updated.  Review of Systems  Respiratory: Negative.    Cardiovascular: Negative.   Gastrointestinal: Negative.   Musculoskeletal:  Positive for back pain and myalgias. Negative for arthralgias, gait problem, joint swelling, neck pain and neck stiffness.  Skin: Negative.   Neurological: Negative.   Psychiatric/Behavioral: Negative.      Per HPI unless specifically indicated above     Objective:    BP 96/63   Pulse 72   Temp 98.4 F (36.9 C) (Oral)   Ht '5\' 4"'$  (1.626 m)   Wt 159 lb 8 oz (72.3 kg)   SpO2 100%   BMI 27.38 kg/m   Wt Readings from Last 3 Encounters:  08/31/22 159 lb 8 oz (72.3 kg)  05/31/22 162 lb 12.8 oz (73.8 kg)  02/26/22 155 lb 11.2 oz (70.6 kg)    Physical Exam Vitals and nursing note reviewed.  Constitutional:      General: She is not in acute distress.    Appearance: Normal appearance. She is normal  weight. She is not ill-appearing, toxic-appearing or diaphoretic.  HENT:     Head: Normocephalic and atraumatic.     Right Ear: External ear normal.     Left Ear: External ear normal.     Nose: Nose normal.     Mouth/Throat:     Mouth: Mucous membranes are moist.     Pharynx: Oropharynx is clear.  Eyes:     General: No scleral icterus.       Right eye: No discharge.        Left eye: No discharge.     Extraocular Movements: Extraocular movements intact.     Conjunctiva/sclera: Conjunctivae normal.     Pupils: Pupils are equal, round, and reactive to light.  Cardiovascular:     Rate and Rhythm: Normal rate and regular rhythm.     Pulses: Normal pulses.     Heart sounds: Normal heart sounds. No murmur heard.    No friction rub. No gallop.  Pulmonary:     Effort: Pulmonary effort is normal. No respiratory distress.     Breath sounds: Normal breath sounds. No stridor. No wheezing, rhonchi or rales.  Chest:     Chest wall: No tenderness.  Musculoskeletal:        General: Normal range of motion.     Cervical back: Normal range of motion and neck supple.  Skin:    General: Skin is warm and dry.     Capillary Refill: Capillary refill takes less than 2 seconds.     Coloration: Skin is not jaundiced or pale.     Findings: No bruising, erythema, lesion or rash.  Neurological:     General: No focal deficit present.     Mental Status: She is alert and oriented to person, place, and time. Mental status is at baseline.  Psychiatric:        Mood and Affect: Mood normal.        Behavior: Behavior normal.        Thought Content: Thought content normal.        Judgment: Judgment normal.     Results for orders placed or performed in visit on 05/31/22  WET PREP FOR [place], YEAST, CLUE   Specimen: Urine   Urine  Result Value Ref Range   Trichomonas Exam Negative Negative   Yeast Exam Negative Negative   Clue Cell Exam Negative Negative  Microscopic Examination   Urine  Result Value Ref  Range   WBC, UA 0-5 0 - 5 /hpf   RBC, Urine None seen 0 - 2 /hpf   Epithelial Cells (non renal) 0-10 0 - 10 /hpf   Bacteria, UA Many (A) None seen/Few  CBC with Differential/Platelet  Result Value Ref Range   WBC 6.8 3.4 - 10.8 x10E3/uL   RBC 4.17 3.77 - 5.28 x10E6/uL   Hemoglobin 13.1 11.1 - 15.9 g/dL   Hematocrit 39.7 34.0 - 46.6 %   MCV 95 79 - 97 fL   MCH 31.4 26.6 - 33.0 pg   MCHC 33.0 31.5 - 35.7 g/dL   RDW 11.7 11.7 - 15.4 %   Platelets 279 150 - 450 x10E3/uL   Neutrophils 54 Not Estab. %   Lymphs 33 Not Estab. %   Monocytes 10 Not Estab. %   Eos 2 Not Estab. %   Basos 1 Not Estab. %   Neutrophils Absolute 3.7 1.4 - 7.0 x10E3/uL   Lymphocytes Absolute 2.2 0.7 - 3.1 x10E3/uL   Monocytes Absolute 0.7 0.1 - 0.9 x10E3/uL   EOS (ABSOLUTE) 0.1 0.0 - 0.4 x10E3/uL   Basophils Absolute 0.1 0.0 - 0.2 x10E3/uL   Immature Granulocytes 0 Not Estab. %   Immature Grans (Abs) 0.0 0.0 - 0.1 x10E3/uL  Comprehensive metabolic panel  Result Value Ref Range   Glucose 90 70 - 99 mg/dL   BUN 18 6 - 20 mg/dL   Creatinine, Ser 0.88 0.57 - 1.00 mg/dL   eGFR 86 >59 mL/min/1.73   BUN/Creatinine Ratio 20 9 - 23   Sodium 139 134 - 144 mmol/L   Potassium 3.8 3.5 - 5.2 mmol/L   Chloride 100 96 - 106 mmol/L   CO2 21 20 - 29 mmol/L   Calcium 9.5 8.7 - 10.2 mg/dL   Total Protein 7.1 6.0 - 8.5 g/dL   Albumin 4.9 3.9 - 4.9 g/dL   Globulin, Total 2.2 1.5 - 4.5 g/dL   Albumin/Globulin Ratio 2.2 1.2 - 2.2   Bilirubin Total <0.2 0.0 - 1.2 mg/dL   Alkaline Phosphatase 58 44 - 121 IU/L   AST 16 0 - 40 IU/L   ALT 18 0 - 32 IU/L  Lipid Panel w/o Chol/HDL Ratio  Result Value Ref Range   Cholesterol, Total 174 100 - 199 mg/dL   Triglycerides 85 0 - 149 mg/dL   HDL 86 >39 mg/dL   VLDL Cholesterol Cal 15  5 - 40 mg/dL   LDL Chol Calc (NIH) 73 0 - 99 mg/dL  Urinalysis, Routine w reflex microscopic  Result Value Ref Range   Specific Gravity, UA 1.020 1.005 - 1.030   pH, UA 7.0 5.0 - 7.5   Color, UA  Yellow Yellow   Appearance Ur Clear Clear   Leukocytes,UA 2+ (A) Negative   Protein,UA Negative Negative/Trace   Glucose, UA Negative Negative   Ketones, UA Negative Negative   RBC, UA Negative Negative   Bilirubin, UA Negative Negative   Urobilinogen, Ur 1.0 0.2 - 1.0 mg/dL   Nitrite, UA Negative Negative   Microscopic Examination See below:   TSH  Result Value Ref Range   TSH 1.020 0.450 - 4.500 uIU/mL  [id number] 11+Oxyco+Alc+Crt-Bund  Result Value Ref Range   Ethanol Negative Cutoff=0.020 %   Amphetamines, Urine Negative Cutoff=1000 ng/mL   Barbiturate Negative Cutoff=200 ng/mL   BENZODIAZ UR QL Negative Cutoff=200 ng/mL   Cannabinoid Quant, Ur Negative Cutoff=50 ng/mL   Cocaine (Metabolite) Negative Cutoff=300 ng/mL   OPIATE SCREEN URINE Negative Cutoff=300 ng/mL   Oxycodone/Oxymorphone, Urine Negative Cutoff=300 ng/mL   Phencyclidine Negative Cutoff=25 ng/mL   Methadone Screen, Urine Negative Cutoff=300 ng/mL   Propoxyphene Negative Cutoff=300 ng/mL   Meperidine Negative Cutoff=200 ng/mL   Tramadol Negative Cutoff=200 ng/mL   Creatinine 101.6 20.0 - 300.0 mg/dL   pH, Urine 7.1 4.5 - 8.9  B12  Result Value Ref Range   Vitamin B-12 476 232 - 1,245 pg/mL      Assessment & Plan:   Problem List Items Addressed This Visit       Nervous and Auditory   Chronic midline low back pain with right-sided sciatica - Primary    Under good control on current regimen. Continue current regimen. Continue to monitor. Call with any concerns. Refills given for 3-4 months. Follow up 3-4 months.        Relevant Medications   oxyCODONE-acetaminophen (PERCOCET/ROXICET) 5-325 MG tablet     Follow up plan: Return 3-4 months.

## 2022-08-31 NOTE — Assessment & Plan Note (Signed)
Under good control on current regimen. Continue current regimen. Continue to monitor. Call with any concerns. Refills given for 3-4 months. Follow up 3-4 months.

## 2022-09-07 ENCOUNTER — Encounter: Payer: Self-pay | Admitting: Family Medicine

## 2022-09-07 ENCOUNTER — Ambulatory Visit: Payer: Managed Care, Other (non HMO) | Admitting: Family Medicine

## 2022-09-07 VITALS — BP 95/67 | HR 71 | Temp 98.7°F | Ht 64.0 in | Wt 159.5 lb

## 2022-09-07 DIAGNOSIS — N3001 Acute cystitis with hematuria: Secondary | ICD-10-CM

## 2022-09-07 DIAGNOSIS — R300 Dysuria: Secondary | ICD-10-CM | POA: Diagnosis not present

## 2022-09-07 LAB — URINALYSIS, ROUTINE W REFLEX MICROSCOPIC
Bilirubin, UA: NEGATIVE
Glucose, UA: NEGATIVE
Ketones, UA: NEGATIVE
Nitrite, UA: NEGATIVE
Protein,UA: NEGATIVE
RBC, UA: NEGATIVE
Specific Gravity, UA: 1.01 (ref 1.005–1.030)
Urobilinogen, Ur: 0.2 mg/dL (ref 0.2–1.0)
pH, UA: 6 (ref 5.0–7.5)

## 2022-09-07 LAB — MICROSCOPIC EXAMINATION: Bacteria, UA: NONE SEEN

## 2022-09-07 MED ORDER — NITROFURANTOIN MONOHYD MACRO 100 MG PO CAPS: 100.0000 mg | ORAL_CAPSULE | Freq: Two times a day (BID) | ORAL | 0 refills | Status: DC

## 2022-09-07 NOTE — Progress Notes (Signed)
BP 95/67   Pulse 71   Temp 98.7 F (37.1 C) (Oral)   Ht '5\' 4"'$  (1.626 m)   Wt 159 lb 8 oz (72.3 kg)   SpO2 100%   BMI 27.38 kg/m    Subjective:    Patient ID: Theresa Roberts, female    DOB: December 02, 1980, 42 y.o.   MRN: [id number]  HPI: Theresa Roberts is a 42 y.o. female  Chief Complaint  Patient presents with   Urinary Tract Infection    Patient says she first noticed symptoms yesterday afternoon. Patient says she has been drinking Cranberry juice and tried AZO treatment.    Urinary Frequency   Abdominal Pain   Urinary Odor   URINARY SYMPTOMS Duration: yesterday Dysuria: yes Urinary frequency: yes Urgency: yes Small volume voids: no Symptom severity: no Urinary incontinence: no Foul odor: yes Hematuria: no Abdominal pain: yes Back pain: no Suprapubic pain/pressure: yes Flank pain: no Fever:  no Vomiting: no Relief with cranberry juice: no Relief with pyridium: yes Status: worse Previous urinary tract infection: yes Recurrent urinary tract infection: no History of sexually transmitted disease: no Vaginal discharge: no Treatments attempted: pyridium, cranberry, and increasing fluids   Relevant past medical, surgical, family and social history reviewed and updated as indicated. Interim medical history since our last visit reviewed. Allergies and medications reviewed and updated.  Review of Systems  Constitutional: Negative.   Respiratory: Negative.    Cardiovascular: Negative.   Gastrointestinal: Negative.   Genitourinary:  Positive for dysuria, frequency, hematuria and urgency. Negative for decreased urine volume, difficulty urinating, dyspareunia, enuresis, flank pain, genital sores, menstrual problem, pelvic pain, vaginal bleeding, vaginal discharge and vaginal pain.  Musculoskeletal: Negative.   Psychiatric/Behavioral: Negative.      Per HPI unless specifically indicated above     Objective:    BP 95/67   Pulse 71   Temp 98.7 F (37.1 C)  (Oral)   Ht '5\' 4"'$  (1.626 m)   Wt 159 lb 8 oz (72.3 kg)   SpO2 100%   BMI 27.38 kg/m   Wt Readings from Last 3 Encounters:  09/07/22 159 lb 8 oz (72.3 kg)  08/31/22 159 lb 8 oz (72.3 kg)  05/31/22 162 lb 12.8 oz (73.8 kg)    Physical Exam Vitals and nursing note reviewed.  Constitutional:      General: She is not in acute distress.    Appearance: Normal appearance. She is well-developed.  HENT:     Head: Normocephalic and atraumatic.     Right Ear: Hearing and external ear normal.     Left Ear: Hearing and external ear normal.     Nose: Nose normal.     Mouth/Throat:     Mouth: Mucous membranes are moist.     Pharynx: Oropharynx is clear.  Eyes:     General: Lids are normal. No scleral icterus.       Right eye: No discharge.        Left eye: No discharge.     Conjunctiva/sclera: Conjunctivae normal.  Pulmonary:     Effort: Pulmonary effort is normal. No respiratory distress.  Musculoskeletal:        General: Normal range of motion.  Skin:    Coloration: Skin is not jaundiced or pale.     Findings: No bruising, erythema, lesion or rash.  Neurological:     Mental Status: She is alert. Mental status is at baseline. She is disoriented.  Psychiatric:  Mood and Affect: Mood normal.        Speech: Speech normal.        Behavior: Behavior normal.        Thought Content: Thought content normal.        Judgment: Judgment normal.     Results for orders placed or performed in visit on 05/31/22  WET PREP FOR [place], YEAST, CLUE   Specimen: Urine   Urine  Result Value Ref Range   Trichomonas Exam Negative Negative   Yeast Exam Negative Negative   Clue Cell Exam Negative Negative  Microscopic Examination   Urine  Result Value Ref Range   WBC, UA 0-5 0 - 5 /hpf   RBC, Urine None seen 0 - 2 /hpf   Epithelial Cells (non renal) 0-10 0 - 10 /hpf   Bacteria, UA Many (A) None seen/Few  CBC with Differential/Platelet  Result Value Ref Range   WBC 6.8 3.4 - 10.8 x10E3/uL    RBC 4.17 3.77 - 5.28 x10E6/uL   Hemoglobin 13.1 11.1 - 15.9 g/dL   Hematocrit 39.7 34.0 - 46.6 %   MCV 95 79 - 97 fL   MCH 31.4 26.6 - 33.0 pg   MCHC 33.0 31.5 - 35.7 g/dL   RDW 11.7 11.7 - 15.4 %   Platelets 279 150 - 450 x10E3/uL   Neutrophils 54 Not Estab. %   Lymphs 33 Not Estab. %   Monocytes 10 Not Estab. %   Eos 2 Not Estab. %   Basos 1 Not Estab. %   Neutrophils Absolute 3.7 1.4 - 7.0 x10E3/uL   Lymphocytes Absolute 2.2 0.7 - 3.1 x10E3/uL   Monocytes Absolute 0.7 0.1 - 0.9 x10E3/uL   EOS (ABSOLUTE) 0.1 0.0 - 0.4 x10E3/uL   Basophils Absolute 0.1 0.0 - 0.2 x10E3/uL   Immature Granulocytes 0 Not Estab. %   Immature Grans (Abs) 0.0 0.0 - 0.1 x10E3/uL  Comprehensive metabolic panel  Result Value Ref Range   Glucose 90 70 - 99 mg/dL   BUN 18 6 - 20 mg/dL   Creatinine, Ser 0.88 0.57 - 1.00 mg/dL   eGFR 86 >59 mL/min/1.73   BUN/Creatinine Ratio 20 9 - 23   Sodium 139 134 - 144 mmol/L   Potassium 3.8 3.5 - 5.2 mmol/L   Chloride 100 96 - 106 mmol/L   CO2 21 20 - 29 mmol/L   Calcium 9.5 8.7 - 10.2 mg/dL   Total Protein 7.1 6.0 - 8.5 g/dL   Albumin 4.9 3.9 - 4.9 g/dL   Globulin, Total 2.2 1.5 - 4.5 g/dL   Albumin/Globulin Ratio 2.2 1.2 - 2.2   Bilirubin Total <0.2 0.0 - 1.2 mg/dL   Alkaline Phosphatase 58 44 - 121 IU/L   AST 16 0 - 40 IU/L   ALT 18 0 - 32 IU/L  Lipid Panel w/o Chol/HDL Ratio  Result Value Ref Range   Cholesterol, Total 174 100 - 199 mg/dL   Triglycerides 85 0 - 149 mg/dL   HDL 86 >39 mg/dL   VLDL Cholesterol Cal 15 5 - 40 mg/dL   LDL Chol Calc (NIH) 73 0 - 99 mg/dL  Urinalysis, Routine w reflex microscopic  Result Value Ref Range   Specific Gravity, UA 1.020 1.005 - 1.030   pH, UA 7.0 5.0 - 7.5   Color, UA Yellow Yellow   Appearance Ur Clear Clear   Leukocytes,UA 2+ (A) Negative   Protein,UA Negative Negative/Trace   Glucose, UA Negative Negative   Ketones, UA  Negative Negative   RBC, UA Negative Negative   Bilirubin, UA Negative Negative    Urobilinogen, Ur 1.0 0.2 - 1.0 mg/dL   Nitrite, UA Negative Negative   Microscopic Examination See below:   TSH  Result Value Ref Range   TSH 1.020 0.450 - 4.500 uIU/mL  [id number] 11+Oxyco+Alc+Crt-Bund  Result Value Ref Range   Ethanol Negative Cutoff=0.020 %   Amphetamines, Urine Negative Cutoff=1000 ng/mL   Barbiturate Negative Cutoff=200 ng/mL   BENZODIAZ UR QL Negative Cutoff=200 ng/mL   Cannabinoid Quant, Ur Negative Cutoff=50 ng/mL   Cocaine (Metabolite) Negative Cutoff=300 ng/mL   OPIATE SCREEN URINE Negative Cutoff=300 ng/mL   Oxycodone/Oxymorphone, Urine Negative Cutoff=300 ng/mL   Phencyclidine Negative Cutoff=25 ng/mL   Methadone Screen, Urine Negative Cutoff=300 ng/mL   Propoxyphene Negative Cutoff=300 ng/mL   Meperidine Negative Cutoff=200 ng/mL   Tramadol Negative Cutoff=200 ng/mL   Creatinine 101.6 20.0 - 300.0 mg/dL   pH, Urine 7.1 4.5 - 8.9  B12  Result Value Ref Range   Vitamin B-12 476 232 - 1,245 pg/mL      Assessment & Plan:   Problem List Items Addressed This Visit   None Visit Diagnoses     Acute cystitis with hematuria    -  Primary   Will treat with nitrofurantoin. Call if not getting better or getting worse. Continue to monitor.   Relevant Orders   Urine Culture   Dysuria       Relevant Orders   Urinalysis, Routine w reflex microscopic        Follow up plan: Return if symptoms worsen or fail to improve.

## 2022-09-11 LAB — URINE CULTURE

## 2022-11-30 ENCOUNTER — Ambulatory Visit: Payer: Managed Care, Other (non HMO) | Admitting: Family Medicine

## 2022-11-30 ENCOUNTER — Encounter: Payer: Self-pay | Admitting: Family Medicine

## 2022-11-30 VITALS — BP 102/67 | HR 83 | Temp 99.2°F | Wt 152.9 lb

## 2022-11-30 DIAGNOSIS — G8929 Other chronic pain: Secondary | ICD-10-CM | POA: Diagnosis not present

## 2022-11-30 DIAGNOSIS — M5441 Lumbago with sciatica, right side: Secondary | ICD-10-CM

## 2022-11-30 DIAGNOSIS — F411 Generalized anxiety disorder: Secondary | ICD-10-CM

## 2022-11-30 MED ORDER — CYCLOBENZAPRINE HCL 10 MG PO TABS: 10.0000 mg | ORAL_TABLET | Freq: Every day | ORAL | 1 refills | Status: DC

## 2022-11-30 MED ORDER — ACYCLOVIR 800 MG PO TABS: ORAL_TABLET | ORAL | 12 refills | Status: DC

## 2022-11-30 MED ORDER — OXYCODONE-ACETAMINOPHEN 5-325 MG PO TABS: 1.0000 | ORAL_TABLET | Freq: Two times a day (BID) | ORAL | 0 refills | Status: DC | PRN

## 2022-11-30 MED ORDER — BUSPIRONE HCL 7.5 MG PO TABS: 7.5000 mg | ORAL_TABLET | Freq: Three times a day (TID) | ORAL | 1 refills | Status: DC

## 2022-11-30 NOTE — Assessment & Plan Note (Signed)
Under good control on current regimen. Continue current regimen. Continue to monitor. Call with any concerns. Refills given for 3 months. Follow up 3 months.    

## 2022-11-30 NOTE — Assessment & Plan Note (Signed)
Under good control on current regimen. Continue current regimen. Continue to monitor. Call with any concerns. Refills given.   

## 2022-11-30 NOTE — Progress Notes (Signed)
BP 102/67   Pulse 83   Temp 99.2 F (37.3 C) (Oral)   Wt 152 lb 14.4 oz (69.4 kg)   SpO2 99%   BMI 26.25 kg/m    Subjective:    Patient ID: Theresa Roberts, female    DOB: Aug 25, 1980, 42 y.o.   MRN: [id number]  HPI: [name] is a 42 y.o. female  Chief Complaint  Patient presents with   Back Pain   CHRONIC PAIN  Present dose: 15 Morphine equivalents Pain control status: controlled Duration: chronic Location: low back Quality: aching and sore Current Pain Level: mild Previous Pain Level: severe Breakthrough pain: no Benefit from narcotic medications: yes What Activities task can be accomplished with current medication? Able to do ADLs and work  Interested in weaning off narcotics:yes   Stool softners/OTC fiber: yes  Previous pain specialty evaluation: yes Non-narcotic analgesic meds: no Narcotic contract: yes  ANXIETY/STRESS Duration: chronic Status:stable Anxious mood: yes  Excessive worrying: no Irritability: yes  Sweating: no Nausea: no Palpitations:no Hyperventilation: no Panic attacks: no Agoraphobia: no  Obscessions/compulsions: no Depressed mood: no    11/30/2022    4:19 PM 09/07/2022   11:21 AM 08/31/2022    3:46 PM 05/31/2022    4:07 PM 02/26/2022    4:23 PM  Depression screen PHQ 2/9  Decreased Interest 0 0 0 1 0  Down, Depressed, Hopeless 0 0 0 0 0  PHQ - 2 Score 0 0 0 1 0  Altered sleeping 0 0 0 0 1  Tired, decreased energy 0 0 Change in appetite 0 0 0 0 0  Feeling bad or failure about yourself  0 0 0 0 0  Trouble concentrating Moving slowly or fidgety/restless Suicidal thoughts 0 0 0 0 0  PHQ-9 Score Difficult doing work/chores Somewhat difficult Somewhat difficult Somewhat difficult Somewhat difficult Somewhat difficult   Anhedonia: no Weight changes: no Insomnia: no   Hypersomnia: no Fatigue/loss of energy: no Feelings of worthlessness: no Feelings of guilt: no Impaired  concentration/indecisiveness: no Suicidal ideations: no  Crying spells: no Recent Stressors/Life Changes: no   Relationship problems: no   Family stress: no     Financial stress: no    Job stress: no    Recent death/loss: no   Relevant past medical, surgical, family and social history reviewed and updated as indicated. Interim medical history since our last visit reviewed. Allergies and medications reviewed and updated.  Review of Systems  Constitutional: Negative.   Respiratory: Negative.    Cardiovascular: Negative.   Gastrointestinal: Negative.   Musculoskeletal:  Positive for arthralgias, back pain and myalgias. Negative for gait problem, joint swelling, neck pain and neck stiffness.  Skin: Negative.   Neurological: Negative.   Psychiatric/Behavioral: Negative.      Per HPI unless specifically indicated above     Objective:    BP 102/67   Pulse 83   Temp 99.2 F (37.3 C) (Oral)   Wt 152 lb 14.4 oz (69.4 kg)   SpO2 99%   BMI 26.25 kg/m   Wt Readings from Last 3 Encounters:  11/30/22 152 lb 14.4 oz (69.4 kg)  09/07/22 159 lb 8 oz (72.3 kg)  08/31/22 159 lb 8 oz (72.3 kg)    Physical Exam Vitals and nursing note reviewed.  Constitutional:      General: She is not in acute distress.  Appearance: Normal appearance. She is not ill-appearing, toxic-appearing or diaphoretic.  HENT:     Head: Normocephalic and atraumatic.     Right Ear: External ear normal.     Left Ear: External ear normal.     Nose: Nose normal.     Mouth/Throat:     Mouth: Mucous membranes are moist.     Pharynx: Oropharynx is clear.  Eyes:     General: No scleral icterus.       Right eye: No discharge.        Left eye: No discharge.     Extraocular Movements: Extraocular movements intact.     Conjunctiva/sclera: Conjunctivae normal.     Pupils: Pupils are equal, round, and reactive to light.  Cardiovascular:     Rate and Rhythm: Normal rate and regular rhythm.     Pulses: Normal  pulses.     Heart sounds: Normal heart sounds. No murmur heard.    No friction rub. No gallop.  Pulmonary:     Effort: Pulmonary effort is normal. No respiratory distress.     Breath sounds: Normal breath sounds. No stridor. No wheezing, rhonchi or rales.  Chest:     Chest wall: No tenderness.  Musculoskeletal:        General: Normal range of motion.     Cervical back: Normal range of motion and neck supple.  Skin:    General: Skin is warm and dry.     Capillary Refill: Capillary refill takes less than 2 seconds.     Coloration: Skin is not jaundiced or pale.     Findings: No bruising, erythema, lesion or rash.  Neurological:     General: No focal deficit present.     Mental Status: She is alert and oriented to person, place, and time. Mental status is at baseline.  Psychiatric:        Mood and Affect: Mood normal.        Behavior: Behavior normal.        Thought Content: Thought content normal.        Judgment: Judgment normal.     Results for orders placed or performed in visit on 09/07/22  Urine Culture   Specimen: Urine   UR  Result Value Ref Range   Urine Culture, Routine Final report (A)    Organism ID, Bacteria Escherichia coli (A)    Antimicrobial Susceptibility Comment   Microscopic Examination   Urine  Result Value Ref Range   WBC, UA 0-5 0 - 5 /hpf   RBC, Urine 0-2 0 - 2 /hpf   Epithelial Cells (non renal) 0-10 0 - 10 /hpf   Bacteria, UA None seen None seen/Few  Urinalysis, Routine w reflex microscopic  Result Value Ref Range   Specific Gravity, UA 1.010 1.005 - 1.030   pH, UA 6.0 5.0 - 7.5   Color, UA Yellow Yellow   Appearance Ur Cloudy (A) Clear   Leukocytes,UA 1+ (A) Negative   Protein,UA Negative Negative/Trace   Glucose, UA Negative Negative   Ketones, UA Negative Negative   RBC, UA Negative Negative   Bilirubin, UA Negative Negative   Urobilinogen, Ur 0.2 0.2 - 1.0 mg/dL   Nitrite, UA Negative Negative   Microscopic Examination See below:        Assessment & Plan:   Problem List Items Addressed This Visit       Nervous and Auditory   Chronic midline low back pain with right-sided sciatica - Primary  Under good control on current regimen. Continue current regimen. Continue to monitor. Call with any concerns. Refills given for 3 months. Follow up 3 months.        Relevant Medications   cyclobenzaprine (FLEXERIL) 10 MG tablet   oxyCODONE-acetaminophen (PERCOCET/ROXICET) 5-325 MG tablet   busPIRone (BUSPAR) 7.5 MG tablet     Other   GAD (generalized anxiety disorder)    Under good control on current regimen. Continue current regimen. Continue to monitor. Call with any concerns. Refills given.        Relevant Medications   busPIRone (BUSPAR) 7.5 MG tablet     Follow up plan: Return in about 3 months (around 03/01/2023) for virtual OK.

## 2022-12-13 ENCOUNTER — Other Ambulatory Visit: Payer: Self-pay | Admitting: Family Medicine

## 2022-12-14 NOTE — Telephone Encounter (Signed)
Unable to refill per protocol, Rx request last refilled 11/30/22 for 30 days and 12 refills, duplicate request.  Requested Prescriptions  Pending Prescriptions Disp Refills   acyclovir (ZOVIRAX) 800 MG tablet [Pharmacy Med Name: ACYCLOVIR 800MG  TABLETS] 30 tablet     Sig: TAKE 1 TABLET(800 MG) BY MOUTH TWICE DAILY     Antimicrobials:  Antiviral Agents - Anti-Herpetic Passed - 12/13/2022 12:34 PM      Passed - Valid encounter within last 12 months    Recent Outpatient Visits           2 weeks ago Chronic midline low back pain with right-sided sciatica   [place] [facility] [place], Megan P, DO   3 months ago Acute cystitis with hematuria   [place] [facility] [place], Megan P, DO   3 months ago Chronic midline low back pain with right-sided sciatica   [place] [facility] [place], Megan P, DO   6 months ago Routine general medical examination at a health care facility   [facility] [place] P, DO   9 months ago Chronic midline low back pain with right-sided sciatica   [place] [facility] Dorcas [place], DO       Future Appointments             In 2 months La[place] Benes, Oralia Rud, DO [place] [facility]

## 2022-12-15 ENCOUNTER — Encounter: Payer: Self-pay | Admitting: Family Medicine

## 2023-01-12 ENCOUNTER — Ambulatory Visit: Payer: Managed Care, Other (non HMO) | Admitting: Obstetrics and Gynecology

## 2023-01-12 ENCOUNTER — Encounter: Payer: Self-pay | Admitting: Obstetrics and Gynecology

## 2023-01-12 VITALS — BP 95/51 | HR 66 | Resp 16 | Ht 65.0 in | Wt 155.0 lb

## 2023-01-12 DIAGNOSIS — Z30011 Encounter for initial prescription of contraceptive pills: Secondary | ICD-10-CM

## 2023-01-12 DIAGNOSIS — Z3046 Encounter for surveillance of implantable subdermal contraceptive: Secondary | ICD-10-CM

## 2023-01-12 MED ORDER — NORETHIN ACE-ETH ESTRAD-FE 1-20 MG-MCG(24) PO CAPS: 1.0000 | ORAL_CAPSULE | Freq: Every day | ORAL | 3 refills | Status: DC

## 2023-01-12 MED ORDER — LEVONORGEST-ETH ESTRADIOL-IRON 0.1-20 MG-MCG(21) PO TABS: 1.0000 | ORAL_TABLET | Freq: Every day | ORAL | 3 refills | Status: DC

## 2023-01-12 NOTE — Progress Notes (Signed)
      GYNECOLOGY OFFICE PROCEDURE NOTE  Theresa Roberts is a 42 y.o. [id number] here for Nexplanon removal. It was inserted on 12/24/2021.   She has used the Nexplanon for contraception for the past 14 years. She feels some pain and discomfort in her arm and she would just like to give her body a break from using it. She would like to trial OCP and condoms for contraception.  Last pap smear was on 12/02/2017 and was normal.    Nexplanon Removal Patient identified, informed consent performed, consent signed.   Appropriate time out taken. Nexplanon site identified.  Area prepped in usual sterile fashon. One ml of 1% lidocaine was used to anesthetize the area at the distal end of the implant. A small stab incision was made right beside the implant on the distal portion.  The Nexplanon rod was grasped using hemostats and removed without difficulty.  There was minimal blood loss. There were no complications.  3 ml of 1% lidocaine was injected around the incision for post-procedure analgesia.  Steri-strips were applied over the small incision.  A pressure bandage was applied to reduce any bruising.  The patient tolerated the procedure well and was given post procedure instructions.  Patient is planning to use OCP and condoms for contraception.  Will prescribe Balcoltra.  Patient to f/u in 2-3 months for annual exam.    Hildred Laser, MD [facility]

## 2023-02-08 ENCOUNTER — Ambulatory Visit: Payer: Managed Care, Other (non HMO) | Admitting: Obstetrics and Gynecology

## 2023-03-08 ENCOUNTER — Encounter: Payer: Self-pay | Admitting: Family Medicine

## 2023-03-08 ENCOUNTER — Telehealth (INDEPENDENT_AMBULATORY_CARE_PROVIDER_SITE_OTHER): Payer: Managed Care, Other (non HMO) | Admitting: Family Medicine

## 2023-03-08 DIAGNOSIS — M5441 Lumbago with sciatica, right side: Secondary | ICD-10-CM | POA: Diagnosis not present

## 2023-03-08 DIAGNOSIS — G8929 Other chronic pain: Secondary | ICD-10-CM | POA: Diagnosis not present

## 2023-03-08 MED ORDER — OXYCODONE-ACETAMINOPHEN 5-325 MG PO TABS: 1.0000 | ORAL_TABLET | Freq: Two times a day (BID) | ORAL | 0 refills | Status: DC | PRN

## 2023-03-08 NOTE — Assessment & Plan Note (Signed)
Under good control on current regimen. Continue current regimen. Continue to monitor. Call with any concerns. Refills given. Refills should last 3 months.

## 2023-03-08 NOTE — Progress Notes (Signed)
There were no vitals taken for this visit.   Subjective:    Patient ID: Theresa Roberts, female    DOB: 1980-09-02, 42 y.o.   MRN: [id number]  HPI: Theresa Roberts is a 42 y.o. female  Chief Complaint  Patient presents with   Pain   CHRONIC PAIN  Present dose: 15 Morphine equivalents Pain control status: stable Duration: chronic Location: low back Quality: aching and sore Current Pain Level: mild Previous Pain Level: mild Breakthrough pain: yes Benefit from narcotic medications: yes What Activities task can be accomplished with current medication? Able to work and do her ADLs Interested in weaning off narcotics:no   Stool softners/OTC fiber: yes  Previous pain specialty evaluation: yes Non-narcotic analgesic meds: no Narcotic contract: yes  Relevant past medical, surgical, family and social history reviewed and updated as indicated. Interim medical history since our last visit reviewed. Allergies and medications reviewed and updated.  Review of Systems  Constitutional: Negative.   Respiratory: Negative.    Cardiovascular: Negative.   Gastrointestinal: Negative.   Musculoskeletal:  Positive for back pain and myalgias. Negative for arthralgias, gait problem, joint swelling, neck pain and neck stiffness.  Skin: Negative.   Neurological: Negative.   Psychiatric/Behavioral: Negative.      Per HPI unless specifically indicated above     Objective:    There were no vitals taken for this visit.  Wt Readings from Last 3 Encounters:  01/12/23 155 lb (70.3 kg)  11/30/22 152 lb 14.4 oz (69.4 kg)  09/07/22 159 lb 8 oz (72.3 kg)    Physical Exam Vitals and nursing note reviewed.  Constitutional:      General: She is not in acute distress.    Appearance: Normal appearance. She is not ill-appearing, toxic-appearing or diaphoretic.  HENT:     Head: Normocephalic and atraumatic.     Right Ear: External ear normal.     Left Ear: External ear normal.     Nose: Nose  normal.     Mouth/Throat:     Mouth: Mucous membranes are moist.     Pharynx: Oropharynx is clear.  Eyes:     General: No scleral icterus.       Right eye: No discharge.        Left eye: No discharge.     Conjunctiva/sclera: Conjunctivae normal.     Pupils: Pupils are equal, round, and reactive to light.  Pulmonary:     Effort: Pulmonary effort is normal. No respiratory distress.     Comments: Speaking in full sentences Musculoskeletal:        General: Normal range of motion.     Cervical back: Normal range of motion.  Skin:    Coloration: Skin is not jaundiced or pale.     Findings: No bruising, erythema, lesion or rash.  Neurological:     Mental Status: She is alert and oriented to person, place, and time. Mental status is at baseline.  Psychiatric:        Mood and Affect: Mood normal.        Behavior: Behavior normal.        Thought Content: Thought content normal.        Judgment: Judgment normal.     Results for orders placed or performed in visit on 09/07/22  Urine Culture   Specimen: Urine   UR  Result Value Ref Range   Urine Culture, Routine Final report (A)    Organism ID, Bacteria Escherichia coli (A)  Antimicrobial Susceptibility Comment   Microscopic Examination   Urine  Result Value Ref Range   WBC, UA 0-5 0 - 5 /hpf   RBC, Urine 0-2 0 - 2 /hpf   Epithelial Cells (non renal) 0-10 0 - 10 /hpf   Bacteria, UA None seen None seen/Few  Urinalysis, Routine w reflex microscopic  Result Value Ref Range   Specific Gravity, UA 1.010 1.005 - 1.030   pH, UA 6.0 5.0 - 7.5   Color, UA Yellow Yellow   Appearance Ur Cloudy (A) Clear   Leukocytes,UA 1+ (A) Negative   Protein,UA Negative Negative/Trace   Glucose, UA Negative Negative   Ketones, UA Negative Negative   RBC, UA Negative Negative   Bilirubin, UA Negative Negative   Urobilinogen, Ur 0.2 0.2 - 1.0 mg/dL   Nitrite, UA Negative Negative   Microscopic Examination See below:       Assessment & Plan:    Problem List Items Addressed This Visit       Nervous and Auditory   Chronic midline low back pain with right-sided sciatica - Primary    Under good control on current regimen. Continue current regimen. Continue to monitor. Call with any concerns. Refills given. Refills should last 3 months.        Relevant Medications   oxyCODONE-acetaminophen (PERCOCET/ROXICET) 5-325 MG tablet     Follow up plan: Return after 10/23 for physical.   This visit was completed via video visit through MyChart due to the restrictions of the COVID-19 pandemic. All issues as above were discussed and addressed. Physical exam was done as above through visual confirmation on video through MyChart. If it was felt that the patient should be evaluated in the office, they were directed there. The patient verbally consented to this visit. Location of the patient: home Location of the provider: work Those involved with this call:  Provider: Olevia Perches, DO CMA:  Maggie Font, CMA Front Desk/Registration:  Servando Snare   Time spent on call:  15 minutes with patient face to face via video conference. More than 50% of this time was spent in counseling and coordination of care. 23 minutes total spent in review of patient's record and preparation of their chart.

## 2023-04-22 ENCOUNTER — Ambulatory Visit: Payer: Managed Care, Other (non HMO) | Admitting: Family Medicine

## 2023-04-26 ENCOUNTER — Ambulatory Visit: Payer: Managed Care, Other (non HMO) | Admitting: Pediatrics

## 2023-04-26 ENCOUNTER — Encounter: Payer: Self-pay | Admitting: Pediatrics

## 2023-04-26 VITALS — BP 111/75 | HR 92 | Ht 65.0 in | Wt 152.2 lb

## 2023-04-26 DIAGNOSIS — R5383 Other fatigue: Secondary | ICD-10-CM

## 2023-04-26 NOTE — Progress Notes (Signed)
BP 111/75   Pulse 92   Ht 5\' 5"  (1.651 m)   Wt 152 lb 3.2 oz (69 kg)   SpO2 100%   BMI 25.33 kg/m    Subjective:    Patient ID: Theresa Roberts, female    DOB: 1980-12-22, 42 y.o.   MRN: [id number]  HPI: Theresa Roberts is a 42 y.o. female  Chief Complaint  Patient presents with   Lyme Disease   Fatigue    Patient says she has been extremely fatigue and has been nauseous. Patient says she says she is not sure if it is all related to her Lyme's Disease diagnosis. Patient says she is not sure how the flare ups with Lyme's Disease or not. Patient says she started being symptomatic about three weeks ago.    Anorexia   Fatigue 3 weeks ago started Has felt very tired lately, originally thought covid  Has increased her exercise regimen, no diet changes but has been more portion controlled. No vegan/veggie complete changes. Had nexplanon in place, had it removed a few months ago Heavier periods, variable; no form of birth control  Poor appetite (baseline) Sleeps well, feels well rested the next day Some shortness of breath compared to prior No cough, palpitations, chest pain, congestion No sick contacts Concerned she had similar sx with lyme diagnosis in 2016; completed treatment at that time Unsure of LMP, sexually active w female partner. Condoms and withdrawal method as birth control.  Relevant past medical, surgical, family and social history reviewed and updated as indicated. Interim medical history since our last visit reviewed. Allergies and medications reviewed and updated.  ROS per HPI unless specifically indicated above     Objective:    BP 111/75   Pulse 92   Ht 5\' 5"  (1.651 m)   Wt 152 lb 3.2 oz (69 kg)   SpO2 100%   BMI 25.33 kg/m   Wt Readings from Last 3 Encounters:  04/26/23 152 lb 3.2 oz (69 kg)  01/12/23 155 lb (70.3 kg)  11/30/22 152 lb 14.4 oz (69.4 kg)     Physical Exam Constitutional:      Appearance: Normal appearance.  HENT:     Head:  Normocephalic and atraumatic.  Eyes:     Pupils: Pupils are equal, round, and reactive to light.  Cardiovascular:     Rate and Rhythm: Normal rate and regular rhythm.     Pulses: Normal pulses.     Heart sounds: Normal heart sounds.  Pulmonary:     Effort: Pulmonary effort is normal.     Breath sounds: Normal breath sounds.  Abdominal:     General: Abdomen is flat.     Palpations: Abdomen is soft.  Musculoskeletal:        General: Normal range of motion.     Cervical back: Normal range of motion.  Skin:    General: Skin is warm and dry.     Capillary Refill: Capillary refill takes less than 2 seconds.  Neurological:     General: No focal deficit present.     Mental Status: She is alert. Mental status is at baseline.  Psychiatric:        Mood and Affect: Mood normal.        Behavior: Behavior normal.       04/26/2023    5:22 PM 03/08/2023    4:12 PM 11/30/2022    4:19 PM 09/07/2022   11:20 AM  GAD 7 : Generalized Anxiety Score  Nervous, Anxious, on Edge 1 0 1 0  Control/stop worrying 1 0 1 0  Worry too much - different things 1 0 1 1  Trouble relaxing 1 0 1 1  Restless 0 0 1 1  Easily annoyed or irritable 1 0 1 1  Afraid - awful might happen 0 0 0 0  Total GAD 7 Score 5 0 6 4  Anxiety Difficulty Somewhat difficult Not difficult at all Somewhat difficult Somewhat difficult       04/26/2023    5:22 PM 03/08/2023    4:12 PM 11/30/2022    4:19 PM 09/07/2022   11:21 AM 08/31/2022    3:46 PM  Depression screen PHQ 2/9  Decreased Interest 1 0 0 0 0  Down, Depressed, Hopeless 0 0 0 0 0  PHQ - 2 Score 1 0 0 0 0  Altered sleeping 1 0 0 0 0  Tired, decreased energy 1 0 0 0 1  Change in appetite 1 0 0 0 0  Feeling bad or failure about yourself  0 0 0 0 0  Trouble concentrating 1 0 1 1 2   Moving slowly or fidgety/restless 0 0 1 1 2   Suicidal thoughts 0 0 0 0 0  PHQ-9 Score 5 0 2 2 5   Difficult doing work/chores Somewhat difficult Not difficult at all Somewhat difficult  Somewhat difficult Somewhat difficult        Assessment & Plan:  Assessment & Plan   Other fatigue Unclear etiology though suspect multifactorial with lifestyle modifications and recent variable menses with some heavier periods. Intentional weight loss per patient, overdue for pap (do at follow up). May have vitamin deficiency and/or anemia based on hx. Will additionally r/o pregnancy given no form of BC being used right now. If negative labs, consider iron studies (not collected today) and lyme disease serologies given reporting similar symptoms. Follow up mood if no organic cause identified.       -     TSH -     CBC with Differential/Platelet -     Vitamin B12 -     Folate -     hCG, serum, qualitative -     Comprehensive metabolic panel  Follow up plan: Return if symptoms worsen or fail to improve.  Mercie Balsley Howell Pringle, MD

## 2023-04-26 NOTE — Patient Instructions (Signed)
Will let you know results, may add more labs

## 2023-04-27 ENCOUNTER — Other Ambulatory Visit: Payer: Self-pay | Admitting: Pediatrics

## 2023-04-27 DIAGNOSIS — R5383 Other fatigue: Secondary | ICD-10-CM

## 2023-04-27 NOTE — Progress Notes (Signed)
Added blood work for encounter from 9/17 for w/u fatigue. She is concerned for recurrent lyme disease, as she had similar symptoms. Added iron panel as well.

## 2023-05-06 ENCOUNTER — Ambulatory Visit: Payer: Managed Care, Other (non HMO) | Admitting: Pediatrics

## 2023-05-06 ENCOUNTER — Encounter: Payer: Self-pay | Admitting: Pediatrics

## 2023-05-06 VITALS — BP 99/64 | HR 72 | Temp 98.4°F | Wt 153.2 lb

## 2023-05-06 DIAGNOSIS — R5383 Other fatigue: Secondary | ICD-10-CM

## 2023-05-06 DIAGNOSIS — R300 Dysuria: Secondary | ICD-10-CM | POA: Diagnosis not present

## 2023-05-06 DIAGNOSIS — B9689 Other specified bacterial agents as the cause of diseases classified elsewhere: Secondary | ICD-10-CM | POA: Diagnosis not present

## 2023-05-06 DIAGNOSIS — N760 Acute vaginitis: Secondary | ICD-10-CM

## 2023-05-06 LAB — URINALYSIS, ROUTINE W REFLEX MICROSCOPIC
Bilirubin, UA: NEGATIVE
Glucose, UA: NEGATIVE
Ketones, UA: NEGATIVE
Nitrite, UA: NEGATIVE
Protein,UA: NEGATIVE
RBC, UA: NEGATIVE
Specific Gravity, UA: 1.025 (ref 1.005–1.030)
Urobilinogen, Ur: 0.2 mg/dL (ref 0.2–1.0)
pH, UA: 6 (ref 5.0–7.5)

## 2023-05-06 LAB — MICROSCOPIC EXAMINATION: Bacteria, UA: NONE SEEN

## 2023-05-06 LAB — WET PREP FOR TRICH, YEAST, CLUE
Clue Cell Exam: POSITIVE — AB
Trichomonas Exam: NEGATIVE
Yeast Exam: NEGATIVE

## 2023-05-06 MED ORDER — FLUCONAZOLE 150 MG PO TABS: 150.0000 mg | ORAL_TABLET | Freq: Once | ORAL | 0 refills | Status: DC

## 2023-05-06 MED ORDER — METRONIDAZOLE 500 MG PO TABS
500.0000 mg | ORAL_TABLET | Freq: Two times a day (BID) | ORAL | 0 refills | Status: AC
Start: 2023-05-06 — End: 2023-05-13

## 2023-05-06 NOTE — Progress Notes (Signed)
Acute Visit  BP 99/64   Pulse 72   Temp 98.4 F (36.9 C) (Oral)   Wt 153 lb 3.2 oz (69.5 kg)   SpO2 98%   BMI 25.49 kg/m    Subjective:    Patient ID: Theresa Roberts, female    DOB: Dec 01, 1980, 42 y.o.   MRN: [id number]  HPI: Theresa Roberts is a 42 y.o. female  Chief Complaint  Patient presents with   Urinary Tract Infection    Patient states she has been having an uncomfortable feeling in her vaginal area for the last few days. States she has been swimming lately and in a jacuzzi   Pt developed uncomfortable feeling in vaginal area Recently swam in [employer] she has new discharge Itchiness No rash No new sexual partners Felt some urinary frequency vs pain but is unsure No back pain no blood in urine Denies abdominal pain, fevers, chills, nausea, vomiting  Relevant past medical, surgical, family and social history reviewed and updated as indicated. Interim medical history since our last visit reviewed. Allergies and medications reviewed and updated.  ROS per HPI unless specifically indicated above     Objective:    BP 99/64   Pulse 72   Temp 98.4 F (36.9 C) (Oral)   Wt 153 lb 3.2 oz (69.5 kg)   SpO2 98%   BMI 25.49 kg/m   Wt Readings from Last 3 Encounters:  05/06/23 153 lb 3.2 oz (69.5 kg)  04/26/23 152 lb 3.2 oz (69 kg)  01/12/23 155 lb (70.3 kg)    Physical Exam Constitutional:      Appearance: Normal appearance.  Pulmonary:     Effort: Pulmonary effort is normal.  Abdominal:     General: Abdomen is flat. Bowel sounds are normal.     Palpations: Abdomen is soft.     Tenderness: There is no right CVA tenderness or left CVA tenderness.  Musculoskeletal:        General: Normal range of motion.  Skin:    Comments: Normal skin color  Neurological:     General: No focal deficit present.     Mental Status: She is alert. Mental status is at baseline.  Psychiatric:        Mood and Affect: Mood normal.        Behavior: Behavior normal.         Thought Content: Thought content normal.        Assessment & Plan:  Assessment & Plan   BV (bacterial vaginosis) Dysuria Patient u/a reassuring, culture pending. Positive for BV which suspect is cause of symptoms. Treatment sent w 7d flagyll with 1 dose diflucan to take after abx. Provided education on vaginal and vulvar care. -     WET PREP FOR TRICH, YEAST, CLUE -     Urinalysis, Routine w reflex microscopic -     Urine Culture -     metroNIDAZOLE; Take 1 tablet (500 mg total) by mouth 2 (two) times daily for 7 days.  Dispense: 14 tablet; Refill: 0 -     Fluconazole; Take 1 tablet (150 mg total) by mouth once for 1 dose.  Dispense: 1 tablet; Refill: 0  Other fatigue Did not discuss problem. Discussed at last visit, labs collected today under this encounter. -     Iron, TIBC and Ferritin Panel -     Lyme Disease Serology w/Reflex  Other orders -     Microscopic Examination  Follow up plan: Return if symptoms  worsen or fail to improve.  Furious Chiarelli Howell Pringle, MD

## 2023-05-06 NOTE — Patient Instructions (Signed)
General Vaginal and Vulvar Care  Get to know your body: The vulva includes all the external organs you can see outside your body. The vulva includes the mons pubis, labia majora (outer lips), labia minora (inner lips), clitoris, and the external openings of the urethra and vagina. People often confuse the vulva with the vagina. The vagina, also known as the birth canal, is inside your body. Only the opening of the vagina (introitus) can be seen from the outside.    Nothing is needed to clean the vagina, the vulva can be cleaned with fragrance free soap such as Dove fragrance-free bar soap, Cetaphil, Basis or Vanicream soap.  Use soft, white, unscented toilet paper.  Stick to the same brand of fragrance-free laundry detergent-- avoid bleach, fabric softeners, and dryer sheets. All Free & Clear is a good choice.  Avoid daily pantiliners; if that is not possible, use unscented brands. Try not to wear pads at home. We recommend Stayfree, Kotex, or organic pads. Avoid using baby wipes/ personal wipes, douches, perfumes, feminine hygiene products, witch hazel to vulvar tissue. If you must use a wipe, consider Water Wipes: 99% water.  Nail polish or acrylic nails can irritate the vulva.  Avoid ALL over the counter itch products (Vagisil, Lanocaine, Vagicaine, Benzocaine, etc.) Use all cotton underwear, not just cotton crotch. Avoid girdles, thongs, and try to keep underwear loose.  Avoid pantyhose-- buy knee or thigh-highs.  If you have difficulty cleaning after a bowel movement, try using Cetaphil body wash or mineral oil after a bowel movement to help remove feces. DON'T RUB! Albolene moisturizing cleanser is a makeup remover that also works well.  Avoid sitting in a wet bathing suit or remaining in sweaty exercise clothing. Vaseline or coconut oil are good lubricants to use after bathing.  For dry skin, CeraVe cream is a good choice.  For intercourse, try fragrance-free over the counter lubricants such  as KY, Replens, Astroglide. Avoid lubricants that will 'heat on contact'. Don't use oil-based lubricants with condoms.  Consider buying a plastic sitz bath from a pharmacy that can fit into your toilet and provide soothing comfort to the vulva; or use the tub. Plain warm water is best.  Follow a healthy diet, low sugar, limit alcohol and consider adding a daily probiotic supplement such as Nature's Way, Renew Life, Raw Probiotics Read about your health, The V Book by Zollie Beckers is an excellent resource.

## 2023-05-07 LAB — IRON,TIBC AND FERRITIN PANEL
Ferritin: 40 ng/mL (ref 15–150)
Iron Saturation: 16 % (ref 15–55)
Iron: 47 ug/dL (ref 27–159)
Total Iron Binding Capacity: 292 ug/dL (ref 250–450)
UIBC: 245 ug/dL (ref 131–425)

## 2023-05-08 LAB — URINE CULTURE

## 2023-05-09 LAB — LYME DISEASE SEROLOGY W/REFLEX: Lyme Total Antibody EIA: NEGATIVE

## 2023-06-14 ENCOUNTER — Other Ambulatory Visit: Payer: Self-pay | Admitting: Pediatrics

## 2023-06-14 DIAGNOSIS — B9689 Other specified bacterial agents as the cause of diseases classified elsewhere: Secondary | ICD-10-CM

## 2023-06-15 NOTE — Telephone Encounter (Signed)
Called pt asked her about sx. Sx mild itching, discharge. Pt is pregnant as of Monday. Pt asked if ok during pregnancy

## 2023-06-17 ENCOUNTER — Ambulatory Visit: Payer: Managed Care, Other (non HMO) | Admitting: Family Medicine

## 2023-06-17 ENCOUNTER — Encounter: Payer: Self-pay | Admitting: Family Medicine

## 2023-06-17 VITALS — BP 84/52 | HR 91 | Wt 153.2 lb

## 2023-06-17 DIAGNOSIS — N912 Amenorrhea, unspecified: Secondary | ICD-10-CM

## 2023-06-17 DIAGNOSIS — N898 Other specified noninflammatory disorders of vagina: Secondary | ICD-10-CM | POA: Diagnosis not present

## 2023-06-17 DIAGNOSIS — Z3200 Encounter for pregnancy test, result unknown: Secondary | ICD-10-CM

## 2023-06-17 DIAGNOSIS — Z3490 Encounter for supervision of normal pregnancy, unspecified, unspecified trimester: Secondary | ICD-10-CM

## 2023-06-17 LAB — WET PREP FOR TRICH, YEAST, CLUE
Clue Cell Exam: NEGATIVE
Trichomonas Exam: NEGATIVE
Yeast Exam: NEGATIVE

## 2023-06-17 LAB — PREGNANCY, URINE: Preg Test, Ur: POSITIVE — AB

## 2023-06-17 MED ORDER — PRENATAL 19 29-1 MG PO TABS: 1.0000 | ORAL_TABLET | Freq: Every day | ORAL | 12 refills | Status: DC

## 2023-06-17 NOTE — Progress Notes (Signed)
BP (!) 84/52   Pulse 91   Wt 153 lb 3.2 oz (69.5 kg)   LMP 05/07/2023 (Exact Date)   SpO2 100%   BMI 25.49 kg/m    Subjective:    Patient ID: Theresa Roberts, female    DOB: 1981/03/24, 43 y.o.   MRN: [id number]  HPI: Theresa Roberts is a 42 y.o. female  Chief Complaint  Patient presents with   Possible Pregnancy    Patient says has always been on Nexplanon and recently stopped it. Patient says she had two cycles in September and the last one lead over into October. Patient says she didn't have another cycle in October. Patient says she start having metallic taste, nauseous, and lower abdominal cramps.    PREGNANCY DIAGNOSIS Gravida/Para: G4P2 Home pregnancy test: Positive x2 Nausea: yes Vomiting: no Breast tenderness: yes Abdominal pain: yes Vaginal bleeding: no Pregnancy or labor complications: no Fetal abnormalities: no Contraception: none- was not preventing, but were not trying  Had 2 periods in September- 9/2-5, and 9/28-10/2, the 2nd one was early and shorter. Not sure if it was a real period.    Relevant past medical, surgical, family and social history reviewed and updated as indicated. Interim medical history since our last visit reviewed. Allergies and medications reviewed and updated.  Review of Systems  Constitutional: Negative.   Respiratory: Negative.    Cardiovascular: Negative.   Musculoskeletal: Negative.   Psychiatric/Behavioral: Negative.      Per HPI unless specifically indicated above     Objective:    BP (!) 84/52   Pulse 91   Wt 153 lb 3.2 oz (69.5 kg)   LMP 05/07/2023 (Exact Date)   SpO2 100%   BMI 25.49 kg/m   Wt Readings from Last 3 Encounters:  06/17/23 153 lb 3.2 oz (69.5 kg)  05/06/23 153 lb 3.2 oz (69.5 kg)  04/26/23 152 lb 3.2 oz (69 kg)    Physical Exam Vitals and nursing note reviewed.  Constitutional:      General: She is not in acute distress.    Appearance: Normal appearance. She is not ill-appearing,  toxic-appearing or diaphoretic.  HENT:     Head: Normocephalic and atraumatic.     Right Ear: External ear normal.     Left Ear: External ear normal.     Nose: Nose normal.     Mouth/Throat:     Mouth: Mucous membranes are moist.     Pharynx: Oropharynx is clear.  Eyes:     General: No scleral icterus.       Right eye: No discharge.        Left eye: No discharge.     Extraocular Movements: Extraocular movements intact.     Conjunctiva/sclera: Conjunctivae normal.     Pupils: Pupils are equal, round, and reactive to light.  Cardiovascular:     Rate and Rhythm: Normal rate and regular rhythm.     Pulses: Normal pulses.     Heart sounds: Normal heart sounds. No murmur heard.    No friction rub. No gallop.  Pulmonary:     Effort: Pulmonary effort is normal. No respiratory distress.     Breath sounds: Normal breath sounds. No stridor. No wheezing, rhonchi or rales.  Chest:     Chest wall: No tenderness.  Musculoskeletal:        General: Normal range of motion.     Cervical back: Normal range of motion and neck supple.  Skin:    General: Skin  is warm and dry.     Capillary Refill: Capillary refill takes less than 2 seconds.     Coloration: Skin is not jaundiced or pale.     Findings: No bruising, erythema, lesion or rash.  Neurological:     General: No focal deficit present.     Mental Status: She is alert and oriented to person, place, and time. Mental status is at baseline.  Psychiatric:        Mood and Affect: Mood normal.        Behavior: Behavior normal.        Thought Content: Thought content normal.        Judgment: Judgment normal.     Results for orders placed or performed in visit on 05/06/23  WET PREP FOR TRICH, YEAST, CLUE   Specimen: Sterile Swab   Sterile Swab  Result Value Ref Range   Trichomonas Exam Negative Negative   Yeast Exam Negative Negative   Clue Cell Exam Positive (A) Negative  Urine Culture   Specimen: Urine   UR  Result Value Ref Range    Urine Culture, Routine Final report    Organism ID, Bacteria Comment   Microscopic Examination   Urine  Result Value Ref Range   WBC, UA 0-5 0 - 5 /hpf   RBC, Urine 0-2 0 - 2 /hpf   Epithelial Cells (non renal) 0-10 0 - 10 /hpf   Bacteria, UA None seen None seen/Few  Urinalysis, Routine w reflex microscopic  Result Value Ref Range   Specific Gravity, UA 1.025 1.005 - 1.030   pH, UA 6.0 5.0 - 7.5   Color, UA Yellow Yellow   Appearance Ur Cloudy (A) Clear   Leukocytes,UA 1+ (A) Negative   Protein,UA Negative Negative/Trace   Glucose, UA Negative Negative   Ketones, UA Negative Negative   RBC, UA Negative Negative   Bilirubin, UA Negative Negative   Urobilinogen, Ur 0.2 0.2 - 1.0 mg/dL   Nitrite, UA Negative Negative   Microscopic Examination See below:   Iron, TIBC and Ferritin Panel  Result Value Ref Range   Total Iron Binding Capacity 292 250 - 450 ug/dL   UIBC 161 096 - 045 ug/dL   Iron 47 27 - 409 ug/dL   Iron Saturation 16 15 - 55 %   Ferritin 40 15 - 150 ng/mL  Lyme Disease Serology w/Reflex  Result Value Ref Range   Lyme Total Antibody EIA Negative Negative      Assessment & Plan:   Problem List Items Addressed This Visit   None Visit Diagnoses     Pregnancy, unspecified gestational age    -  Primary   Either 5 or [redacted] weeks pregnant due to ? implantation vs period at end of September. Will refer to OBGYN. Prenatal sent. Call with any concerns.   Relevant Orders   Ambulatory referral to Obstetrics / Gynecology   Possible pregnancy       + pregnancy   Relevant Orders   Pregnancy, urine   Vaginal discharge       Will do wet prep. Await results.   Relevant Orders   WET PREP FOR TRICH, YEAST, CLUE        Follow up plan: Return if symptoms worsen or fail to improve.

## 2023-06-21 ENCOUNTER — Telehealth: Payer: Self-pay

## 2023-06-21 NOTE — Telephone Encounter (Signed)
TRIAGE VOICEMAIL: Patient recently found out she is pregnant. She has a virtual visit next week and an ultrasound on Friday. Her youngest child is 44. She has some questions about cramping issues in her lower stomach. Sometimes it will hurt pretty bad and make her get hot, she starts sweating. She denies fever. Inquiring if this is normal. It's been so long, she can't remember.

## 2023-06-21 NOTE — Telephone Encounter (Signed)
Spoke with patient. She denies bleeding. She reports her bowel movements are regular. The pain she described is intermittent and not concentrated to one side it happens in different areas of lower abdomen, but subsides quickly. Advised mild cramping is normal, if cramping is constant,, not relieved w/Tylenol or doubling her over she should report to ED. Advised on diet of fresh fruits and vegetables and remaining well hydrated. Constipation is also normal in early pregnancy. Advised can use stool softeners if needed. She will monitor and report any increase in symptoms.

## 2023-06-28 ENCOUNTER — Ambulatory Visit (INDEPENDENT_AMBULATORY_CARE_PROVIDER_SITE_OTHER): Payer: Managed Care, Other (non HMO)

## 2023-06-28 DIAGNOSIS — Z3480 Encounter for supervision of other normal pregnancy, unspecified trimester: Secondary | ICD-10-CM

## 2023-06-28 DIAGNOSIS — Z3689 Encounter for other specified antenatal screening: Secondary | ICD-10-CM | POA: Diagnosis not present

## 2023-06-28 DIAGNOSIS — Z3687 Encounter for antenatal screening for uncertain dates: Secondary | ICD-10-CM

## 2023-06-28 HISTORY — DX: Encounter for supervision of other normal pregnancy, unspecified trimester: Z34.80

## 2023-06-28 NOTE — Progress Notes (Signed)
New OB Intake  I connected with  Theresa Roberts on 06/28/23 at 11:15 AM EST by telephone and verified that I am speaking with the correct person using two identifiers. Nurse is located at [facility] and pt is located at home.  I explained I am completing New OB Intake today. We discussed her EDD is unknown at this time due to irregular cycles after nexplanon removal. Pt is G3/P2. I reviewed her allergies, medications, Medical/Surgical/OB history, and appropriate screenings. There are no cats in the home. Based on history, this is a/an pregnancy uncomplicated .   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  Anatomy US Explained first scheduled [place] will be on 07/01/23. Anatomy US will be around 20 weeks of gestational age.  Labs Discussed genetic screening with patient. Patient desires genetic testing to be drawn at new OB visit. Discussed possible labs to be drawn at new OB appointment.  COVID Vaccine Patient has not had COVID vaccine.   Social Determinants of Health Food Insecurity: denies food insecurity Transportation: Patient denies transportation needs. Childcare: Discussed no children allowed at ultrasound appointments.   First visit review I reviewed new OB appt with pt. I explained she will have bloodwork and pap smear/pelvic exam if indicated. Explained pt will be seen by Carie Caddy, CNM at first visit; encounter routed to appropriate provider.   Donnetta Hail, [facility] 06/28/2023  10:31 AM

## 2023-06-28 NOTE — Patient Instructions (Signed)
First Trimester of Pregnancy  The first trimester of pregnancy starts on the first day of your last menstrual period until the end of week 12. This is months 1 through 3 of pregnancy. A week after a sperm fertilizes an egg, the egg will implant into the wall of the uterus and begin to develop into a baby. By the end of 12 weeks, all the baby's organs will be formed and the baby will be 2-3 inches in size. Body changes during your first trimester Your body goes through many changes during pregnancy. The changes vary and generally return to normal after your baby is born. Physical changes You may gain or lose weight. Your breasts may begin to grow larger and become tender. The tissue that surrounds your nipples (areola) may become darker. Dark spots or blotches (chloasma or mask of pregnancy) may develop on your face. You may have changes in your hair. These can include thickening or thinning of your hair or changes in texture. Health changes You may feel nauseous, and you may vomit. You may have heartburn. You may develop headaches. You may develop constipation. Your gums may bleed and may be sensitive to brushing and flossing. Other changes You may tire easily. You may urinate more often. Your menstrual periods will stop. You may have a loss of appetite. You may develop cravings for certain kinds of food. You may have changes in your emotions from day to day. You may have more vivid and strange dreams. Follow these instructions at home: Medicines Follow your health care provider's instructions regarding medicine use. Specific medicines may be either safe or unsafe to take during pregnancy. Do not take any medicines unless told to by your health care provider. Take a prenatal vitamin that contains at least 600 micrograms (mcg) of folic acid. Eating and drinking Eat a healthy diet that includes fresh fruits and vegetables, whole grains, good sources of protein such as meat, eggs, or tofu,  and low-fat dairy products. Avoid raw meat and unpasteurized juice, milk, and cheese. These carry germs that can harm you and your baby. If you feel nauseous or you vomit: Eat 4 or 5 small meals a day instead of 3 large meals. Try eating a few soda crackers. Drink liquids between meals instead of during meals. You may need to take these actions to prevent or treat constipation: Drink enough fluid to keep your urine pale yellow. Eat foods that are high in fiber, such as beans, whole grains, and fresh fruits and vegetables. Limit foods that are high in fat and processed sugars, such as fried or sweet foods. Activity Exercise only as directed by your health care provider. Most people can continue their usual exercise routine during pregnancy. Try to exercise for 30 minutes at least 5 days a week. Stop exercising if you develop pain or cramping in the lower abdomen or lower back. Avoid exercising if it is very hot or humid or if you are at high altitude. Avoid heavy lifting. If you choose to, you may have sex unless your health care provider tells you not to. Relieving pain and discomfort Wear a good support bra to relieve breast tenderness. Rest with your legs elevated if you have leg cramps or low back pain. If you develop bulging veins (varicose veins) in your legs: Wear support hose as told by your health care provider. Elevate your feet for 15 minutes, 3-4 times a day. Limit salt in your diet. Safety Wear your seat belt at all times when   driving or riding in a car. Talk with your health care provider if someone is verbally or physically abusive to you. Talk with your health care provider if you are feeling sad or have thoughts of hurting yourself. Lifestyle Do not use hot tubs, steam rooms, or saunas. Do not douche. Do not use tampons or scented sanitary pads. Do not use herbal remedies, alcohol, illegal drugs, or medicines that are not approved by your health care provider. Chemicals  in these products can harm your baby. Do not use any products that contain nicotine or tobacco, such as cigarettes, e-cigarettes, and chewing tobacco. If you need help quitting, ask your health care provider. Avoid cat litter boxes and soil used by cats. These carry germs that can cause birth defects in the baby and possibly loss of the unborn baby (fetus) by miscarriage or stillbirth. General instructions During routine prenatal visits in the first trimester, your health care provider will do a physical exam, perform necessary tests, and ask you how things are going. Keep all follow-up visits. This is important. Ask for help if you have counseling or nutritional needs during pregnancy. Your health care provider can offer advice or refer you to specialists for help with various needs. Schedule a dentist appointment. At home, brush your teeth with a soft toothbrush. Floss gently. Write down your questions. Take them to your prenatal visits. Where to find more information American Pregnancy Association: americanpregnancy.org [employer]: https://www.todd-brady.net/ Office on [employer] Health: MightyReward.co.nz Contact a health care provider if you have: Dizziness. A fever. Mild pelvic cramps, pelvic pressure, or nagging pain in the abdominal area. Nausea, vomiting, or diarrhea that lasts for 24 hours or longer. A bad-smelling vaginal discharge. Pain when you urinate. Known exposure to a contagious illness, such as chickenpox, measles, Zika virus, HIV, or hepatitis. Get help right away if you have: Spotting or bleeding from your vagina. Severe abdominal cramping or pain. Shortness of breath or chest pain. Any kind of trauma, such as from a fall or a car crash. New or increased pain, swelling, or redness in an arm or leg. Summary The first trimester of pregnancy starts on the first day of your last menstrual period until the end of week  12 (months 1 through 3). Eating 4 or 5 small meals a day rather than 3 large meals may help to relieve nausea and vomiting. Do not use any products that contain nicotine or tobacco, such as cigarettes, e-cigarettes, and chewing tobacco. If you need help quitting, ask your health care provider. Keep all follow-up visits. This is important. This information is not intended to replace advice given to you by your health care provider. Make sure you discuss any questions you have with your health care provider. Document Revised: 01/02/2020 Document Reviewed: 11/08/2019 Elsevier Patient Education  2024 Elsevier Inc.  Commonly Asked Questions During Pregnancy  Cats: A parasite can be excreted in cat feces.  To avoid exposure you need to have another person empty the little box.  If you must empty the litter box you will need to wear gloves.  Wash your hands after handling your cat.  This parasite can also be found in raw or undercooked meat so this should also be avoided.  Colds, Sore Throats, Flu: Please check your medication sheet to see what you can take for symptoms.  If your symptoms are unrelieved by these medications please call the office.  Dental Work: Most any dental work Agricultural consultant recommends is permitted.  X-rays should only be taken during the first trimester if absolutely necessary.  Your abdomen should be shielded with a lead apron during all x-rays.  Please notify your provider prior to receiving any x-rays.  Novocaine is fine; gas is not recommended.  If your dentist requires a note from [place] prior to dental work please call the office and we will provide one for you.  Exercise: Exercise is an important part of staying healthy during your pregnancy.  You may continue most exercises you were accustomed to prior to pregnancy.  Later in your pregnancy you will most likely notice you have difficulty with activities requiring balance like riding a bicycle.  It is important that you listen to  your body and avoid activities that put you at a higher risk of falling.  Adequate rest and staying well hydrated are a must!  If you have questions about the safety of specific activities ask your provider.    Exposure to Children with illness: Try to avoid obvious exposure; report any symptoms to [place] when noted,  If you have chicken pos, red measles or mumps, you should be immune to these diseases.   Please do not take any vaccines while pregnant unless you have checked with your OB provider.  Fetal Movement: After 28 weeks we recommend you do "kick counts" twice daily.  Lie or sit down in a calm quiet environment and count your baby movements "kicks".  You should feel your baby at least 10 times per hour.  If you have not felt 10 kicks within the first hour get up, walk around and have something sweet to eat or drink then repeat for an additional hour.  If count remains less than 10 per hour notify your provider.  Fumigating: Follow your pest control agent's advice as to how long to stay out of your home.  Ventilate the area well before re-entering.  Hemorrhoids:   Most over-the-counter preparations can be used during pregnancy.  Check your medication to see what is safe to use.  It is important to use a stool softener or fiber in your diet and to drink lots of liquids.  If hemorrhoids seem to be getting worse please call the office.   Hot Tubs:  Hot tubs Jacuzzis and saunas are not recommended while pregnant.  These increase your internal body temperature and should be avoided.  Intercourse:  Sexual intercourse is safe during pregnancy as long as you are comfortable, unless otherwise advised by your provider.  Spotting may occur after intercourse; report any bright red bleeding that is heavier than spotting.  Labor:  If you know that you are in labor, please go to the hospital.  If you are unsure, please call the office and let us help you decide what to do.  Lifting, straining, etc:  If your job  requires heavy lifting or straining please check with your provider for any limitations.  Generally, you should not lift items heavier than that you can lift simply with your hands and arms (no back muscles)  Painting:  Paint fumes do not harm your pregnancy, but may make you ill and should be avoided if possible.  Latex or water based paints have less odor than oils.  Use adequate ventilation while painting.  Permanents & Hair Color:  Chemicals in hair dyes are not recommended as they cause increase hair dryness which can increase hair loss during pregnancy.  " Highlighting" and permanents are allowed.  Dye may be absorbed differently and permanents  may not hold as well during pregnancy.  Sunbathing:  Use a sunscreen, as skin burns easily during pregnancy.  Drink plenty of fluids; avoid over heating.  Tanning Beds:  Because their possible side effects are still unknown, tanning beds are not recommended.  Ultrasound Scans:  Routine ultrasounds are performed at approximately 20 weeks.  You will be able to see your baby's general anatomy an if you would like to know the gender this can usually be determined as well.  If it is questionable when you conceived you may also receive an ultrasound early in your pregnancy for dating purposes.  Otherwise ultrasound exams are not routinely performed unless there is a medical necessity.  Although you can request a scan we ask that you pay for it when conducted because insurance does not cover " patient request" scans.  Work: If your pregnancy proceeds without complications you may work until your due date, unless your physician or employer advises otherwise.  Round Ligament Pain/Pelvic Discomfort:  Sharp, shooting pains not associated with bleeding are fairly common, usually occurring in the second trimester of pregnancy.  They tend to be worse when standing up or when you remain standing for long periods of time.  These are the result of pressure of certain  pelvic ligaments called "round ligaments".  Rest, Tylenol and heat seem to be the most effective relief.  As the womb and fetus grow, they rise out of the pelvis and the discomfort improves.  Please notify the office if your pain seems different than that described.  It may represent a more serious condition.  Common Medications Safe in Pregnancy  Acne:      Constipation:  Benzoyl Peroxide     Colace  Clindamycin      Dulcolax Suppository  Topica Erythromycin     Fibercon  Salicylic Acid      Metamucil         Miralax AVOID:        Senakot   Accutane    Cough:  Retin-A       Cough Drops  Tetracycline      Phenergan w/ Codeine if Rx  Minocycline      Robitussin (Plain & DM)  Antibiotics:     Crabs/Lice:  Ceclor       RID  Cephalosporins    AVOID:  E-Mycins      Kwell  Keflex  Macrobid/Macrodantin   Diarrhea:  Penicillin      Kao-Pectate  Zithromax      Imodium AD         PUSH FLUIDS AVOID:       Cipro     Fever:  Tetracycline      Tylenol (Regular or Extra  Minocycline       Strength)  Levaquin      Extra Strength-Do not          Exceed 8 tabs/24 hrs Caffeine:        200mg /day (equiv. To 1 cup of coffee or  approx. 3 12 oz sodas)         Gas: Cold/Hayfever:       Gas-X  Benadryl      Mylicon  Claritin       Phazyme  **Claritin-D        Chlor-Trimeton    Headaches:  Dimetapp      ASA-Free Excedrin  Drixoral-Non-Drowsy     Cold Compress  Mucinex (Guaifenasin)     Tylenol (Regular or Extra  Sudafed/Sudafed-12 Hour  Strength)  **Sudafed PE Pseudoephedrine   Tylenol Cold & Sinus     Vicks Vapor Rub  Zyrtec  **AVOID if Problems With Blood Pressure         Heartburn: Avoid lying down for at least 1 hour after meals  Aciphex      Maalox     Rash:  Milk of Magnesia     Benadryl    Mylanta       1% Hydrocortisone Cream  Pepcid  Pepcid Complete   Sleep Aids:  Prevacid      Ambien   Prilosec       Benadryl  Rolaids       Chamomile Tea  Tums (Limit  4/day)     Unisom         Tylenol PM         Warm milk-add vanilla or  Hemorrhoids:       Sugar for taste  Anusol/Anusol H.C.  (RX: Analapram 2.5%)  Sugar Substitutes:  Hydrocortisone OTC     Ok in moderation  Preparation H      Tucks        Vaseline lotion applied to tissue with wiping    Herpes:     Throat:  Acyclovir      Oragel  Famvir  Valtrex     Vaccines:         Flu Shot Leg Cramps:       *Gardasil  Benadryl      Hepatitis A         Hepatitis B Nasal Spray:       Pneumovax  Saline Nasal Spray     Polio Booster         Tetanus Nausea:       Tuberculosis test or PPD  Vitamin B6 25 mg TID   AVOID:    Dramamine      *Gardasil  Emetrol       Live Poliovirus  Ginger Root 250 mg QID    MMR (measles, mumps &  High Complex Carbs @ Bedtime    rebella)  Sea Bands-Accupressure    Varicella (Chickenpox)  Unisom 1/2 tab TID     *No known complications           If received before Pain:         Known pregnancy;   Darvocet       Resume series after  Lortab        Delivery  Percocet    Yeast:   Tramadol      Femstat  Tylenol 3      Gyne-lotrimin  Ultram       Monistat  Vicodin           MISC:         All Sunscreens           Hair Coloring/highlights          Insect Repellant's          (Including DEET)         Mystic Tans  

## 2023-07-01 ENCOUNTER — Ambulatory Visit: Payer: Managed Care, Other (non HMO)

## 2023-07-01 DIAGNOSIS — Z3687 Encounter for antenatal screening for uncertain dates: Secondary | ICD-10-CM

## 2023-07-01 DIAGNOSIS — Z3480 Encounter for supervision of other normal pregnancy, unspecified trimester: Secondary | ICD-10-CM

## 2023-07-04 ENCOUNTER — Encounter: Payer: Self-pay | Admitting: Nurse Practitioner

## 2023-07-04 ENCOUNTER — Ambulatory Visit: Payer: Managed Care, Other (non HMO) | Admitting: Nurse Practitioner

## 2023-07-04 VITALS — BP 110/65 | HR 95 | Temp 98.5°F | Ht 64.0 in | Wt 157.6 lb

## 2023-07-04 DIAGNOSIS — J029 Acute pharyngitis, unspecified: Secondary | ICD-10-CM

## 2023-07-04 DIAGNOSIS — J0110 Acute frontal sinusitis, unspecified: Secondary | ICD-10-CM

## 2023-07-04 DIAGNOSIS — R051 Acute cough: Secondary | ICD-10-CM | POA: Diagnosis not present

## 2023-07-04 MED ORDER — AMOXICILLIN 500 MG PO CAPS: 500.0000 mg | ORAL_CAPSULE | Freq: Two times a day (BID) | ORAL | 0 refills | Status: AC

## 2023-07-04 NOTE — Progress Notes (Signed)
BP 110/65 (BP Location: Left Arm, Patient Position: Sitting, Cuff Size: Normal)   Pulse 95   Temp 98.5 F (36.9 C) (Oral)   Ht 5\' 4"  (1.626 m)   Wt 157 lb 9.6 oz (71.5 kg)   LMP 05/07/2023 (Exact Date) Comment: shorter and earlier than usual, prior to that was 04/11/23  SpO2 98%   BMI 27.05 kg/m    Subjective:    Patient ID: Theresa Roberts, female    DOB: [birth year], 42 y.o.   MRN: [id number]  HPI: Theresa Roberts is a 42 y.o. female  Chief Complaint  Patient presents with   possible bronchitis     Took covid test:NEGATIVE   Sore Throat    Taking tylenol due to pregnancy    congested   UPPER RESPIRATORY TRACT INFECTION Worst symptom: symptoms started last Wednesday.  She took a COVID test and it was negative.   Fever: no Cough: yes Shortness of breath: yes Wheezing: yes Chest pain: yes, with cough Chest tightness: yes Chest congestion: yes Nasal congestion: yes Runny nose: yes Post nasal drip: yes Sneezing: no Sore throat: yes Swollen glands: yes Sinus pressure: yes Headache: yes Face pain: yes Toothache: no Ear pain: yes bilateral Ear pressure: yes bilateral Eyes red/itching:no Eye drainage/crusting: no  Vomiting: no Rash: no Fatigue: yes Sick contacts: no Strep contacts: no  Context: worse Recurrent sinusitis: no Relief with OTC cold/cough medications: yes  Treatments attempted:  tylenol    Relevant past medical, surgical, family and social history reviewed and updated as indicated. Interim medical history since our last visit reviewed. Allergies and medications reviewed and updated.  Review of Systems  Constitutional:  Positive for fatigue. Negative for fever.  HENT:  Positive for congestion, postnasal drip, rhinorrhea, sinus pressure, sinus pain, sneezing and sore throat. Negative for dental problem and ear pain.   Respiratory:  Positive for cough, shortness of breath and wheezing.   Cardiovascular:  Positive for chest pain.   Gastrointestinal:  Negative for vomiting.  Skin:  Negative for rash.  Neurological:  Positive for headaches.    Per HPI unless specifically indicated above     Objective:    BP 110/65 (BP Location: Left Arm, Patient Position: Sitting, Cuff Size: Normal)   Pulse 95   Temp 98.5 F (36.9 C) (Oral)   Ht 5\' 4"  (1.626 m)   Wt 157 lb 9.6 oz (71.5 kg)   LMP 05/07/2023 (Exact Date) Comment: shorter and earlier than usual, prior to that was 04/11/23  SpO2 98%   BMI 27.05 kg/m   Wt Readings from Last 3 Encounters:  07/04/23 157 lb 9.6 oz (71.5 kg)  06/17/23 153 lb 3.2 oz (69.5 kg)  05/06/23 153 lb 3.2 oz (69.5 kg)    Physical Exam Vitals and nursing note reviewed.  Constitutional:      General: She is not in acute distress.    Appearance: Normal appearance. She is normal weight. She is not ill-appearing, toxic-appearing or diaphoretic.  HENT:     Head: Normocephalic.     Right Ear: External ear normal. A middle ear effusion is present.     Left Ear: External ear normal. A middle ear effusion is present.     Nose: Congestion and rhinorrhea present.     Right Turbinates: Swollen.     Left Turbinates: Swollen.     Right Sinus: Maxillary sinus tenderness and frontal sinus tenderness present.     Left Sinus: Maxillary sinus tenderness and frontal sinus tenderness present.  Mouth/Throat:     Mouth: Mucous membranes are moist.     Pharynx: Oropharynx is clear. Posterior oropharyngeal erythema present. No oropharyngeal exudate.  Eyes:     General:        Right eye: No discharge.        Left eye: No discharge.     Extraocular Movements: Extraocular movements intact.     Conjunctiva/sclera: Conjunctivae normal.     Pupils: Pupils are equal, round, and reactive to light.  Cardiovascular:     Rate and Rhythm: Normal rate and regular rhythm.     Heart sounds: No murmur heard. Pulmonary:     Effort: Pulmonary effort is normal. No respiratory distress.     Breath sounds: Normal breath  sounds. No wheezing or rales.  Musculoskeletal:     Cervical back: Normal range of motion and neck supple.  Lymphadenopathy:     Cervical:     Right cervical: No superficial cervical adenopathy.    Left cervical: No superficial cervical adenopathy.  Skin:    General: Skin is warm and dry.     Capillary Refill: Capillary refill takes less than 2 seconds.  Neurological:     General: No focal deficit present.     Mental Status: She is alert and oriented to person, place, and time. Mental status is at baseline.  Psychiatric:        Mood and Affect: Mood normal.        Behavior: Behavior normal.        Thought Content: Thought content normal.        Judgment: Judgment normal.     Results for orders placed or performed in visit on 06/17/23  WET PREP FOR TRICH, YEAST, CLUE   Specimen: Sterile Swab   Sterile Swab  Result Value Ref Range   Trichomonas Exam Negative Negative   Yeast Exam Negative Negative   Clue Cell Exam Negative Negative  Pregnancy, urine  Result Value Ref Range   Preg Test, Ur Positive (A) Negative      Assessment & Plan:   Problem List Items Addressed This Visit   None Visit Diagnoses     Acute non-recurrent frontal sinusitis    -  Primary   Will treat with amoxicicillin x 10 days.  Flu and Strep negative.  Complete course of antibiotics. Recommend resting and staying well hydrated.   Sore throat       Relevant Orders   Rapid Strep screen([facility])   Acute cough       Relevant Orders   Veritor Flu A/B Waived        Follow up plan: No follow-ups on file.

## 2023-07-06 ENCOUNTER — Encounter: Payer: Managed Care, Other (non HMO) | Admitting: Licensed Practical Nurse

## 2023-07-07 LAB — CULTURE, GROUP A STREP

## 2023-07-07 LAB — RAPID STREP SCREEN (MED CTR MEBANE ONLY): Strep Gp A Ag, IA W/Reflex: NEGATIVE

## 2023-07-07 LAB — VERITOR FLU A/B WAIVED
Influenza A: NEGATIVE
Influenza B: NEGATIVE

## 2023-07-28 ENCOUNTER — Ambulatory Visit (INDEPENDENT_AMBULATORY_CARE_PROVIDER_SITE_OTHER): Payer: Managed Care, Other (non HMO) | Admitting: Licensed Practical Nurse

## 2023-07-28 ENCOUNTER — Encounter: Payer: Self-pay | Admitting: Licensed Practical Nurse

## 2023-07-28 VITALS — BP 111/65 | HR 85 | Wt 161.6 lb

## 2023-07-28 DIAGNOSIS — O09521 Supervision of elderly multigravida, first trimester: Secondary | ICD-10-CM | POA: Diagnosis not present

## 2023-07-28 DIAGNOSIS — O09529 Supervision of elderly multigravida, unspecified trimester: Secondary | ICD-10-CM

## 2023-07-28 DIAGNOSIS — Z3A11 11 weeks gestation of pregnancy: Secondary | ICD-10-CM

## 2023-07-28 HISTORY — DX: Supervision of elderly multigravida, unspecified trimester: O09.529

## 2023-07-28 LAB — POCT URINALYSIS DIPSTICK
Bilirubin, UA: NEGATIVE
Blood, UA: NEGATIVE
Glucose, UA: NEGATIVE
Ketones, UA: NEGATIVE
Leukocytes, UA: NEGATIVE
Nitrite, UA: NEGATIVE
Protein, UA: NEGATIVE
Spec Grav, UA: 1.015 (ref 1.010–1.025)
Urobilinogen, UA: 0.2 U/dL
pH, UA: 7 (ref 5.0–8.0)

## 2023-07-28 MED ORDER — ASPIRIN 81 MG PO CHEW: 81.0000 mg | CHEWABLE_TABLET | Freq: Every day | ORAL | Status: DC

## 2023-07-28 NOTE — Progress Notes (Signed)
New Obstetric Patient H&P    Chief Complaint: "Desires prenatal care"   History of Present Illness: Patient is a 42 y.o. [id number] Not Hispanic or Latino female, presents with amenorrhea and positive home pregnancy test. Patient's last menstrual period was 05/07/2023 (exact date). and based on her  LMP, her EDD is Estimated Date of Delivery: 02/11/24 and her EGA is [redacted]w[redacted]d. Cycles are  3-5. days, regular, and occur approximately every : month Her last pap smear was unsure, not on file    This was a surprise pregnancy, she had a Nexplanon removed and started on pills. She is both excited and nervous to be pregnant  She had an [place] on 11/22 that showed an SIUP at 8wk4days    Since her LMP she claims she has experienced "feeling off" like she had COVID. She denies vaginal bleeding. Her past medical history is contibutory (PTSD, OCD, Anxiety, breast implants, AMA, scoliosis). Her prior pregnancies are notable for  (SVB x2, second child induced at 36 weeks for Polyhydramnios, Bottle fed, plans to formula feed)  Since her LMP, she admits to the use of tobacco products  no She claims she has gained    unsure  pounds since the start of her pregnancy.   She admits close contact with children on a regular basis  no  She has had chicken pox in the past unknown She has had Tuberculosis exposures, symptoms, or previously tested positive for TB   no Current or past history of domestic violence. Yes, second child's father  Genetic Screening/Teratology Counseling: (Includes patient, baby's father, or anyone in either family with:)   1. Patient's age >/= 1 at [facility]  yes 2. Thalassemia ([place], [place], Mediterranean, or Asian background): MCV<80  no 3. Neural tube defect (meningomyelocele, spina bifida, anencephaly)  no 4. Congenital heart defect  no  5. Down syndrome  no 6. Tay-Sachs (Jewish, [place]  no 7. Canavan's Disease  no 8. Sickle cell disease or trait (African)  no  9. Hemophilia or other  blood disorders  no  10. Muscular dystrophy  no  11. Cystic fibrosis  no  12. Huntington's Chorea  no  13. Mental retardation/autism  no 14. Other inherited genetic or chromosomal disorder  no 15. Maternal metabolic disorder (DM, PKU, etc)  no 16. Patient or FOB with a child with a birth defect not listed above no  16a. Patient or FOB with a birth defect themselves no 17. Recurrent pregnancy loss, or stillbirth  no  18. Any medications since LMP other than prenatal vitamins (include vitamins, supplements, OTC meds, drugs, alcohol)  no 19. Any other genetic/environmental exposure to discuss  no  Infection History:   1. Lives with someone with TB or TB exposed  no  2. Patient or partner has history of genital herpes  not applicable 3. Rash or viral illness since LMP  no 4. History of STI (GC, CT, HPV, syphilis, HIV)  no 5. History of recent travel :  no  Other pertinent information:  yes  Works for [employer] from [employer] with her partner, feels safe, her children ar 22 and 32, he has 2 children ages 34 and 62.  Exercise: like to go to the gym    Review of Systems:10 point review of systems negative unless otherwise noted in HPI  Past Medical History:  Patient Active Problem List   Diagnosis Date Noted Date Diagnosed   AMA (advanced maternal age) multigravida 35+ 07/28/2023    Supervision of  other normal pregnancy, antepartum 06/28/2023      Clinical Staff Provider  Office Location  [facility] Dating  Not found.  Language  English Anatomy US    Flu Vaccine  Declined Genetic Screen  NIPS:   TDaP vaccine  Offer Hgb A1C or  GTT Early : Third trimester :   Covid    LAB RESULTS   Rhogam     Blood Type     RSV Offer Antibody    Feeding Plan Formula Rubella    Contraception BC pills RPR     Circumcision Yes HBsAg     Pediatrician  [facility]    Support Person FOB Varicella    Prenatal Classes No GBS  (For PCN allergy, check sensitivities)      Hep C     BTL Consent  Pap No results found for: "DIAGPAP"  VBAC Consent  Hgb Electro      CF      SMA             GAD (generalized anxiety disorder) 11/30/2021    Chronic post-traumatic stress disorder (PTSD) 11/30/2021    Acute pain of left knee 03/19/2021    Scoliosis 01/23/2016    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  Obstetric History: [id number]  Family History:  Family History  Problem Relation Age of Onset   Breast cancer Maternal Grandmother        malignant, unknown age   Heart attack Maternal Grandfather    Diabetes Maternal Grandfather        type 2   Asthma Son    Breast cancer Maternal Great-grandmother        early years    Social History:  Social History   Socioeconomic History   Marital status: Significant Other    Spouse name: Not on file   Number of children: 2   Years of education: Not on file   Highest education level: Not on file  Occupational History   Not on file  Tobacco Use   Smoking status: Never   Smokeless tobacco: Never  Vaping Use   Vaping status: Never Used  Substance and Sexual Activity   Alcohol use: No    Alcohol/week: 0.0 standard drinks of alcohol   Drug use: No   Sexual activity: Yes    Partners: Male    Birth control/protection: None  Other Topics Concern   Not on file  Social History Narrative   Not on file   Social Drivers of Health   Financial Resource Strain: Low Risk  (06/28/2023)   Overall Financial Resource Strain (CARDIA)    Difficulty of Paying Living Expenses: Not very hard  Food Insecurity: No Food Insecurity (06/28/2023)   Hunger Vital Sign    Worried About Running Out of Food in the Last  Year: Never true    Ran Out of Food in the Last Year: Never true  Transportation Needs: No Transportation Needs (06/28/2023)   PRAPARE - Administrator, Civil Service (Medical): No    Lack of Transportation (Non-Medical): No  Physical Activity: Insufficiently Active (06/28/2023)   Exercise Vital Sign    Days of  Exercise per Week: 2 days    Minutes of Exercise per Session: 20 min  Stress: No Stress Concern Present (06/28/2023)   [employer] of Occupational Health - Occupational Stress Questionnaire    Feeling of Stress : Not at all  Social Connections: Moderately Integrated (06/28/2023)   Social Connection and Isolation Panel [NHANES]    Frequency of Communication with Friends and Family: More than three times a week    Frequency of Social Gatherings with Friends and Family: Twice a week    Attends Religious Services: More than 4 times per year    Active Member of [employer] or Organizations: No    Attends Banker Meetings: Never    Marital Status: Living with partner  Intimate Partner Violence: Not At Risk (06/28/2023)   Humiliation, Afraid, Rape, and Kick questionnaire    Fear of Current or Ex-Partner: No    Emotionally Abused: No    Physically Abused: No    Sexually Abused: No    Allergies:  Allergies  Allergen Reactions   Wellbutrin [Bupropion] Other (See Comments)    irritability    Medications: Prior to Admission medications   Medication Sig Start Date End Date Taking? Authorizing Provider  acetaminophen (TYLENOL) 650 MG CR tablet Take 650 mg by mouth every 8 (eight) hours as needed for pain.   Yes [provider]  acyclovir (ZOVIRAX) 800 MG tablet TAKE 1 TABLET(800 MG) BY MOUTH TWICE DAILY Patient taking differently: Take by mouth as needed. TAKE 1 TABLET(800 MG) BY MOUTH TWICE DAILY 11/30/22  Yes Johnson, Megan P, DO  Prenatal Vit-DSS-Fe Fum-FA (PRENATAL 19) 29-1 MG TABS Take 1 each by mouth daily with breakfast. 06/17/23  Yes Dorcas Carrow, DO    Physical Exam Vitals: Blood pressure 111/65, pulse 85, weight 161 lb 9.6 oz (73.3 kg), last menstrual period 05/07/2023.  General: NAD HEENT: normocephalic, anicteric Thyroid: no enlargement, no palpable nodules Pulmonary: No increased work of breathing, CTAB Breasts: soft no redness or masses, nipple erect and intact bilaterally. Cardiovascular: RRR, distal pulses 2+ Abdomen: NABS, soft, non-tender, non-distended.  Umbilicus without lesions.  No hepatomegaly, splenomegaly or masses palpable. No evidence of hernia. Fetal heart tone 170's  Genitourinary:  External: Normal external female genitalia.  Normal urethral meatus, normal  Bartholin's and Skene's glands.    Vagina: Normal vaginal mucosa, no evidence of prolapse.    Cervix: Grossly normal in appearance, some bleeding with pap collection  Uterus:   Adnexa:   Rectal: deferred Extremities: no edema, erythema, or tenderness Neurologic: Grossly intact Psychiatric: mood appropriate, affect full   Assessment: 42 y.o. F6O1308 at [redacted]w[redacted]d presenting to initiate prenatal care  Plan: 1) Avoid alcoholic beverages. 2) Patient encouraged not to smoke.  3) Discontinue the use of all non-medicinal drugs and chemicals.  4) Take prenatal vitamins daily.  5) Nutrition, food safety (fish, cheese advisories, and high nitrite foods) and exercise discussed. 6) Hospital and practice style discussed with cross coverage system.  7) Genetic Screening, such as with 1st Trimester Screening, cell free fetal DNA, AFP testing, and Ultrasound, as well as with amniocentesis and CVS as appropriate, is discussed with patient. At the conclusion of today's visit patient requested genetic testing 8) Patient is asked about travel to areas at risk for the Zika virus, and counseled to avoid travel and exposure to mosquitoes or sexual partners who may have themselves been exposed to the virus. Testing is discussed, and will be ordered as appropriate.    POC for [facility]  reviewed Rec starting Baby ASA daily  NOB labs and pap collected  RTC 4 wks Anatomy US ordered    Carie Caddy, CNM   [facility] 07/28/2023, 1:56 PM

## 2023-07-28 NOTE — Patient Instructions (Signed)
  To prevent headaches: Magnesium 500 mg daily B2 400 mg daily Coenzyme q10 150 mg daily Ferrous Sulfate 65 mg elemental iron daily   For a bad headahe: 2 extra strength Tylenol, 1 Benadryl or Sudafed, 1 can of coke

## 2023-07-30 LAB — NICOTINE SCREEN, URINE: Cotinine Ql Scrn, Ur: NEGATIVE ng/mL

## 2023-07-30 LAB — URINE DRUG PANEL 7
Amphetamines, Urine: NEGATIVE ng/mL
Barbiturate Quant, Ur: NEGATIVE ng/mL
Benzodiazepine Quant, Ur: NEGATIVE ng/mL
Cannabinoid Quant, Ur: NEGATIVE ng/mL
Cocaine (Metab.): NEGATIVE ng/mL
Opiate Quant, Ur: NEGATIVE ng/mL
PCP Quant, Ur: NEGATIVE ng/mL

## 2023-07-30 LAB — URINE CULTURE

## 2023-08-03 LAB — MATERNIT 21 PLUS CORE, BLOOD
Fetal Fraction: 20
Result (T21): NEGATIVE
Trisomy 13 (Patau syndrome): NEGATIVE
Trisomy 18 (Edwards syndrome): NEGATIVE
Trisomy 21 (Down syndrome): NEGATIVE

## 2023-08-07 LAB — RPR+RH+ABO+RUB AB+AB SCR+CB...
Antibody Screen: NEGATIVE
HIV Screen 4th Generation wRfx: NONREACTIVE
Hematocrit: 37.4 % (ref 34.0–46.6)
Hemoglobin: 12.3 g/dL (ref 11.1–15.9)
Hepatitis B Surface Ag: NEGATIVE
MCH: 31.8 pg (ref 26.6–33.0)
MCHC: 32.9 g/dL (ref 31.5–35.7)
MCV: 97 fL (ref 79–97)
Platelets: 306 10*3/uL (ref 150–450)
RBC: 3.87 x10E6/uL (ref 3.77–5.28)
RDW: 11.9 % (ref 11.7–15.4)
RPR Ser Ql: NONREACTIVE
Rh Factor: POSITIVE
Rubella Antibodies, IGG: 3.3 {index} (ref >0.99)
Varicella zoster IgG: REACTIVE
WBC: 9.1 10*3/uL (ref 3.4–10.8)

## 2023-08-07 LAB — HGB FRACTIONATION CASCADE
Hgb A2: 2.7 % (ref 1.8–3.2)
Hgb A: 97.3 % (ref 96.4–98.8)
Hgb F: 0 % (ref 0.0–2.0)
Hgb S: 0 %

## 2023-08-07 LAB — HEPATITIS C ANTIBODY: Hep C Virus Ab: NONREACTIVE

## 2023-08-07 LAB — HEMOGLOBIN A1C
Est. average glucose Bld gHb Est-mCnc: 105 mg/dL
Hgb A1c MFr Bld: 5.3 % (ref 4.8–5.6)

## 2023-08-09 LAB — HPV GENOTYPES 16/18,45
HPV Genotype 16: NEGATIVE
HPV Genotype 18,45: NEGATIVE

## 2023-08-09 LAB — IGP,CTNG,APTIMAHPV,RFX16/18,45
Chlamydia, Nuc. Acid Amp: NEGATIVE
Gonococcus by Nucleic Acid Amp: NEGATIVE
HPV Aptima: POSITIVE — AB

## 2023-08-10 NOTE — L&D Delivery Note (Signed)
 Delivery Note   Theresa Roberts is a 43 y.o. H6E8897 at [redacted]w[redacted]d Estimated Date of Delivery: 02/11/24  PRE-OPERATIVE DIAGNOSIS:  1) [redacted]w[redacted]d pregnancy.    POST-OPERATIVE DIAGNOSIS:  1) [redacted]w[redacted]d pregnancy s/p Vaginal, Spontaneous   Delivery Type: Vaginal, Spontaneous   Delivery Anesthesia: Epidural  Labor Complications: fetal tachycardia   ESTIMATED BLOOD LOSS: 300 ml    FINDINGS:   1) female infant, Wynter, Apgar scores of 6   at 1 minute and 8   at 5 minutes and a birthweight of 9 lbs., 14 ounces.     SPECIMENS:   PLACENTA:   Appearance: intact   Removal: spontaneous    Disposition: per protocol  CORD BLOOD: Not Indicated  DISPOSITION:  Infant left in stable condition in the delivery room, with L&D personnel and mother  NARRATIVE SUMMARY: Labor course:  Theresa Roberts is a [id number] at [redacted]w[redacted]d who presented to Labor & Delivery for PROM. Her initial cervical exam was FT/50/-. Labor proceeded with augmentation, and she was found to be completely dilated at 0935. She made slow progress over the first hour of pushing. Pitocin was started. Theresa Roberts brought the baby down to a small crown. Fetal tachycardia was noted with minimal continued descent. Dr. Janit was called to the bedside for a possible vacuum delivery. The head was slowly birthed over several pushes in LOT position.. Slow movement of the shoulders was noted. Theresa Roberts was placed in McRoberts position. The shoulders were in the transverse position. Dr. Janit was at the bedside and rotated the infant to ROT and and then swept the anterior (right) arm.  The posterior arm then birthed, followed by the rest of the baby at 1230. There was not a nuchal cord. The infant was placed skin-to-skin with Theresa Roberts. The infant was initially stunned but responded to vigorous stimulation. The cord was doubly clamped and cut after about 30 seconds. Baby Theresa Roberts was brought to the warmer for further assessment and respiratory support by [facility] RN and MD.   The placenta delivered spontaneously and was noted to be intact with a 3VC. A perineal and vaginal examination was performed. Episiotomy/Lacerations: 2nd degree Lacerations were repaired with Vicryl suture using local and epidural anesthesia. Theresa Roberts tolerated this well. Mother and baby were left in stable condition.   Theresa Roberts, CNM 01/31/2024 1:26 PM

## 2023-08-11 ENCOUNTER — Ambulatory Visit
Admission: RE | Admit: 2023-08-11 | Discharge: 2023-08-11 | Disposition: A | Discharge status: Home / Self Care | Payer: Managed Care, Other (non HMO) | Source: Ambulatory Visit | Attending: Obstetrics | Admitting: Obstetrics

## 2023-08-11 ENCOUNTER — Ambulatory Visit (INDEPENDENT_AMBULATORY_CARE_PROVIDER_SITE_OTHER): Payer: Managed Care, Other (non HMO) | Admitting: Obstetrics

## 2023-08-11 ENCOUNTER — Telehealth: Payer: Self-pay

## 2023-08-11 ENCOUNTER — Encounter: Payer: Self-pay | Admitting: Obstetrics

## 2023-08-11 ENCOUNTER — Other Ambulatory Visit: Payer: Self-pay | Admitting: Obstetrics

## 2023-08-11 VITALS — BP 120/80 | Wt 162.0 lb

## 2023-08-11 DIAGNOSIS — O209 Hemorrhage in early pregnancy, unspecified: Secondary | ICD-10-CM

## 2023-08-11 DIAGNOSIS — Z3A13 13 weeks gestation of pregnancy: Secondary | ICD-10-CM

## 2023-08-11 NOTE — Telephone Encounter (Signed)
 Theresa Roberts

## 2023-08-11 NOTE — Telephone Encounter (Signed)
 Chart reviewed. No records found in EPIC of patient being seen at Emergency Department. Patient has appointment today at our office @1 :15.

## 2023-08-11 NOTE — Progress Notes (Signed)
    ACUTE VISIT NOTE   Subjective  42 y.o. H6E8897 at [redacted]w[redacted]d presents for an acute visit for spotting.   Patient spotting started at 1AM 08/10/23 bright red, reports no pain, passed 1 blood clot, a little larger than a quarter. Today the bleeding has stopped and is passing old, brown blood. No cramping with this bleeding, no preceding intercourse, and no injury or trauma. No SCH seen on her 8wk dating US  07/01/23.   Patient reports: Contractions: Not present Denies vaginal bleeding or leaking fluid. Objective  Flow sheet Vitals: BP: 120/80 Total weight gain: 12 lb (5.443 kg)  General Appearance  No acute distress, well appearing, and well nourished Pulmonary   Normal work of breathing GU    Cervix visually closed, no lesions; scant, brown blood in vault. No abnormal discharge.  Neurologic   Alert and oriented to person, place, and time Psychiatric   Mood and affect within normal limits  Assessment/Plan   Plan  42 y.o. [id number] at [redacted]w[redacted]d by LMP=8wk US  presents for follow-up OB visit. Reviewed prenatal record including previous visit note.  1. Vaginal bleeding in pregnancy, first trimester (Primary) -Discussed ddx of early vaginal bleeding, no source seen today.  -Pt's blood type is AB+ -HCG obtained today, repeat Monday, 1/6 -US  ordered for [facility] asap (clinic schedule full). -Pelvic rest until further instructions given.  -Bleeding precautions given and when to present to ED.  -Will call with results and any further treatment if necessary.  -Keep prenatal visit 1/20 as scheduled.    Orders Placed This Encounter  Procedures   US  OB Comp Less 14 Wks    Standing Status:   Future    Expected Date:   08/11/2023    Expiration Date:   08/10/2024    Reason for Exam (SYMPTOM  OR DIAGNOSIS REQUIRED):   vaginal bleeding in first trimester    Preferred Imaging Location?:   [facility]   Beta hCG quant (ref lab)   Beta hCG quant (ref lab)    Standing Status:   Future     Expected Date:   08/15/2023    Expiration Date:   09/11/2023   Return in about 4 days (around 08/15/2023) for Labs.   Future Appointments  Date Time Provider Department Center  08/11/2023  3:15 PM [facility]  [facility]US  [facility]-Outpati  08/15/2023 10:20 AM [facility]-OBGYN LAB [facility] None  08/29/2023  8:55 AM Dominic, Jinnie Jansky, CNM [facility]-AOB None  09/15/2023  8:15 AM AOB-AOB US  1 AOB-IMG None   For next visit:  continue with routine prenatal care   Estil Mangle, DO [facility]

## 2023-08-12 ENCOUNTER — Telehealth: Payer: Self-pay

## 2023-08-12 LAB — BETA HCG QUANT (REF LAB): hCG Quant: 49841 m[IU]/mL

## 2023-08-12 NOTE — Telephone Encounter (Signed)
 Pt called triage wanting someone to go over her u/s from yesterday. I advised her that the final report was not back yet. That's why she has not got a call. When the report is final the provider will call with the results.

## 2023-08-15 ENCOUNTER — Other Ambulatory Visit: Payer: Managed Care, Other (non HMO)

## 2023-08-15 ENCOUNTER — Telehealth: Payer: Self-pay | Admitting: Obstetrics

## 2023-08-15 DIAGNOSIS — O209 Hemorrhage in early pregnancy, unspecified: Secondary | ICD-10-CM

## 2023-08-15 NOTE — Telephone Encounter (Signed)
 I talked with you earlier about this patient. She came in for Beta HCG today. She had a beta done on 08/11/2023 and the results were [phone]. She is concerned because she had some bleeding. Her ultrasound wasn't in at the time we discussed her this morning. It has been resulted. I told her that someone would follow up with her when her ultrasound came back. Please advise.

## 2023-08-15 NOTE — Telephone Encounter (Signed)
 Informed pt of her US  results and that everything looked normal. Recommend repeating bHCG today to ensure levels are staying constant, and she can call back with further questions or concerns, otherwise, keep prenatal visit on 1/20, and call us  sooner if bleeding recurs.

## 2023-08-16 ENCOUNTER — Telehealth: Payer: Self-pay | Admitting: Obstetrics

## 2023-08-16 LAB — BETA HCG QUANT (REF LAB): hCG Quant: 38461 m[IU]/mL

## 2023-08-16 NOTE — Telephone Encounter (Signed)
 Discussed U/S results with pt, reassurance given. She is no longer spotting. Discussed variance in hCG levels at this EGA. Pt with several questions re: EDD, MaterniT21, other lab results, placenta, etc. Reviewed chart and results, provided broad reassurance that at this time, EDD is 02/11/24, and all results to date are reassuring. Encouraged her to bring any questions to her next prenatal visit and to call us  sooner with concerns.

## 2023-08-16 NOTE — Telephone Encounter (Signed)
 Completed, see separate Telephone Encounter.

## 2023-08-29 ENCOUNTER — Encounter: Payer: Self-pay | Admitting: Licensed Practical Nurse

## 2023-08-29 ENCOUNTER — Encounter: Payer: Managed Care, Other (non HMO) | Admitting: Advanced Practice Midwife

## 2023-08-29 ENCOUNTER — Ambulatory Visit (INDEPENDENT_AMBULATORY_CARE_PROVIDER_SITE_OTHER): Payer: Managed Care, Other (non HMO) | Admitting: Licensed Practical Nurse

## 2023-08-29 VITALS — BP 100/57 | HR 103 | Wt 164.3 lb

## 2023-08-29 DIAGNOSIS — O09522 Supervision of elderly multigravida, second trimester: Secondary | ICD-10-CM

## 2023-08-29 DIAGNOSIS — O09529 Supervision of elderly multigravida, unspecified trimester: Secondary | ICD-10-CM

## 2023-08-29 DIAGNOSIS — Z3480 Encounter for supervision of other normal pregnancy, unspecified trimester: Secondary | ICD-10-CM

## 2023-08-29 DIAGNOSIS — F411 Generalized anxiety disorder: Secondary | ICD-10-CM

## 2023-08-29 DIAGNOSIS — Z3A16 16 weeks gestation of pregnancy: Secondary | ICD-10-CM

## 2023-08-29 DIAGNOSIS — O99342 Other mental disorders complicating pregnancy, second trimester: Secondary | ICD-10-CM

## 2023-08-29 LAB — POCT URINALYSIS DIPSTICK
Bilirubin, UA: NEGATIVE
Blood, UA: NEGATIVE
Glucose, UA: NEGATIVE
Ketones, UA: NEGATIVE
Leukocytes, UA: NEGATIVE
Nitrite, UA: NEGATIVE
Protein, UA: NEGATIVE
Spec Grav, UA: 1.02 (ref 1.010–1.025)
Urobilinogen, UA: 0.2 U/dL
pH, UA: 7.5 (ref 5.0–8.0)

## 2023-08-29 NOTE — Progress Notes (Signed)
    Return Prenatal Note   Subjective   43 y.o. [id number] at [redacted]w[redacted]d presents for this follow-up prenatal visit.  Latrece reports not having any more episodes of vaginal bleeding since last seen. She has been feeling "period cramps" intermittently. She also mentions having some intermittent headaches which she believes are from working from home and being in front of a computer for long periods of time. Cory reports feeling some anxiety after her previous vaginal bleeding episode and being told she has a low-lying placenta, but reports feeling more reassured after talking about it today, and hearing FHR on doppler.   Movement: Absent Contractions: Not present  Objective   Flow sheet Vitals: Pulse Rate: (!) 103 BP: (!) 100/57 Fetal Heart Rate (bpm): 150 Total weight gain: 6.486 kg  General Appearance  No acute distress, well appearing, and well nourished Pulmonary   Normal work of breathing Neurologic   Alert and oriented to person, place, and time Psychiatric   Mood and affect within normal limits  Assessment/Plan   Plan  43 y.o. [id number] at [redacted]w[redacted]d presents for follow-up OB visit. Reviewed prenatal record including previous visit note.  Supervision of other normal pregnancy, antepartum Discussed second trimester warning signs and knows to call if she is experiencing more episodes of bleeding and/or cramping with bleeding.  Discussed taking frequent breaks while working on the computer, increasing hydration, and considering adding electrolyte powder to water to see if headaches are less frequent.  Discussed finding of low-lying placenta and what it means, and future anatomy scan that will provide more information on this finding.    AMA (advanced maternal age) multigravida 35+ Continue baby ASA. Anatomy scan scheduled.   GAD (generalized anxiety disorder) Reassurance provided regarding previous bleeding episode and discussed physiology of low-lying placenta.  Encouraged Sheala to  reach out with questions or concerns as they come up.       Orders Placed This Encounter  Procedures   POCT Urinalysis Dipstick   Return in about 4 weeks (around 09/26/2023) for ROB.   Future Appointments  Date Time Provider Department Center  09/15/2023  8:15 AM [facility]-AOB [place] 1 AOB-IMG None  09/26/2023  3:15 PM Glenetta Borg, CNM AOB-AOB None    For next visit: Anatomy scan and continue with routine prenatal care     Eloise Levels, Student-MidWife  08/28/2508:04 AM  Carie Caddy, CNM present for all portions of care.

## 2023-08-29 NOTE — Assessment & Plan Note (Signed)
Reassurance provided regarding previous bleeding episode and discussed physiology of low-lying placenta.  Encouraged Theresa Roberts to reach out with questions or concerns as they come up.

## 2023-08-29 NOTE — Assessment & Plan Note (Signed)
Continue baby ASA. Anatomy scan scheduled.

## 2023-08-29 NOTE — Assessment & Plan Note (Signed)
Discussed second trimester warning signs and knows to call if she is experiencing more episodes of bleeding and/or cramping with bleeding.  Discussed taking frequent breaks while working on the computer, increasing hydration, and considering adding electrolyte powder to water to see if headaches are less frequent.  Discussed finding of low-lying placenta and what it means, and future anatomy scan that will provide more information on this finding.

## 2023-09-15 ENCOUNTER — Ambulatory Visit: Payer: Managed Care, Other (non HMO)

## 2023-09-15 ENCOUNTER — Other Ambulatory Visit: Payer: Self-pay | Admitting: Licensed Practical Nurse

## 2023-09-15 DIAGNOSIS — Z363 Encounter for antenatal screening for malformations: Secondary | ICD-10-CM

## 2023-09-15 DIAGNOSIS — O09522 Supervision of elderly multigravida, second trimester: Secondary | ICD-10-CM

## 2023-09-15 DIAGNOSIS — O09529 Supervision of elderly multigravida, unspecified trimester: Secondary | ICD-10-CM

## 2023-09-15 DIAGNOSIS — Z3A18 18 weeks gestation of pregnancy: Secondary | ICD-10-CM

## 2023-09-15 DIAGNOSIS — Z3A11 11 weeks gestation of pregnancy: Secondary | ICD-10-CM

## 2023-09-20 ENCOUNTER — Ambulatory Visit (INDEPENDENT_AMBULATORY_CARE_PROVIDER_SITE_OTHER): Payer: Managed Care, Other (non HMO)

## 2023-09-20 ENCOUNTER — Other Ambulatory Visit (HOSPITAL_COMMUNITY)
Admission: RE | Admit: 2023-09-20 | Discharge: 2023-09-20 | Disposition: A | Discharge status: Home / Self Care | Payer: Managed Care, Other (non HMO) | Source: Ambulatory Visit | Attending: Obstetrics | Admitting: Obstetrics

## 2023-09-20 ENCOUNTER — Ambulatory Visit: Payer: Managed Care, Other (non HMO) | Admitting: Obstetrics

## 2023-09-20 VITALS — BP 100/66 | HR 89 | Ht 64.0 in | Wt 170.0 lb

## 2023-09-20 DIAGNOSIS — Z3A19 19 weeks gestation of pregnancy: Secondary | ICD-10-CM | POA: Insufficient documentation

## 2023-09-20 DIAGNOSIS — R8290 Unspecified abnormal findings in urine: Secondary | ICD-10-CM | POA: Insufficient documentation

## 2023-09-20 DIAGNOSIS — N898 Other specified noninflammatory disorders of vagina: Secondary | ICD-10-CM

## 2023-09-20 LAB — POCT URINALYSIS DIPSTICK OB
Bilirubin, UA: NEGATIVE
Blood, UA: NEGATIVE
Glucose, UA: NEGATIVE
Ketones, UA: NEGATIVE
Leukocytes, UA: NEGATIVE
Nitrite, UA: NEGATIVE
POC,PROTEIN,UA: NEGATIVE
Spec Grav, UA: 1.02 (ref 1.010–1.025)
Urobilinogen, UA: 0.2 U/dL
pH, UA: 7 (ref 5.0–8.0)

## 2023-09-20 NOTE — Progress Notes (Signed)
    NURSE VISIT NOTE  Subjective:    Patient ID: Theresa Roberts, female    DOB: [birth year], 43 y.o.   MRN: [id number]       HPI  Patient is a 43 y.o. G88P1102 female who presents for  urine odor Pt also asking for a self swab is having some itching, discharge that is white some days are thicker.   Objective:    BP 100/66   Pulse 89   Ht 5\' 4"  (1.626 m)   Wt 170 lb (77.1 kg)   LMP 05/07/2023 (Exact Date) Comment: shorter and earlier than usual, prior to that was 04/11/23  BMI 29.18 kg/m    Lab Review  Results for orders placed or performed in visit on 09/20/23  POC Urinalysis Dipstick OB  Result Value Ref Range   Color, UA     Clarity, UA     Glucose, UA Negative Negative   Bilirubin, UA Negative    Ketones, UA Negative    Spec Grav, UA 1.020 1.010 - 1.025   Blood, UA Negative    pH, UA 7.0 5.0 - 8.0   POC,PROTEIN,UA Negative Negative, Trace, Small (1+), Moderate (2+), Large (3+), 4+   Urobilinogen, UA 0.2 0.2 or 1.0 E.U./dL   Nitrite, UA Negative    Leukocytes, UA Negative Negative   Appearance     Odor      Assessment:   1. [redacted] weeks gestation of pregnancy   2. Bad odor of urine   3. Vaginal discharge      Plan:   Urine Culture Sent. Maintain adequate hydration.  Follow up if symptoms worsen or fail to improve as anticipated, and as needed.  Patient verified her label for ancillary swab and urine culture that she self collected in office.     Loney Laurence, CMA

## 2023-09-21 LAB — CERVICOVAGINAL ANCILLARY ONLY
Bacterial Vaginitis (gardnerella): NEGATIVE
Candida Glabrata: NEGATIVE
Candida Vaginitis: POSITIVE — AB
Chlamydia: NEGATIVE
Comment: NEGATIVE
Comment: NEGATIVE
Comment: NEGATIVE
Comment: NEGATIVE
Comment: NEGATIVE
Comment: NORMAL
Neisseria Gonorrhea: NEGATIVE
Trichomonas: NEGATIVE

## 2023-09-22 ENCOUNTER — Telehealth: Payer: Self-pay

## 2023-09-22 ENCOUNTER — Other Ambulatory Visit: Payer: Self-pay

## 2023-09-22 DIAGNOSIS — B379 Candidiasis, unspecified: Secondary | ICD-10-CM

## 2023-09-22 LAB — URINE CULTURE, OB REFLEX

## 2023-09-22 LAB — CULTURE, OB URINE

## 2023-09-22 MED ORDER — FLUCONAZOLE 150 MG PO TABS: 150.0000 mg | ORAL_TABLET | Freq: Every day | ORAL | 0 refills | Status: DC

## 2023-09-22 NOTE — Telephone Encounter (Signed)
Pt called triage she got her results on her mychart that she has Yeast infection and wanted a diflucan sent in, rx sent pt aware.

## 2023-09-26 ENCOUNTER — Encounter: Payer: Self-pay | Admitting: Obstetrics

## 2023-09-26 ENCOUNTER — Other Ambulatory Visit (HOSPITAL_COMMUNITY)
Admission: RE | Admit: 2023-09-26 | Discharge: 2023-09-26 | Disposition: A | Discharge status: Home / Self Care | Payer: Managed Care, Other (non HMO) | Source: Ambulatory Visit | Attending: Obstetrics | Admitting: Obstetrics

## 2023-09-26 ENCOUNTER — Ambulatory Visit (INDEPENDENT_AMBULATORY_CARE_PROVIDER_SITE_OTHER): Payer: Managed Care, Other (non HMO) | Admitting: Obstetrics

## 2023-09-26 VITALS — BP 104/67 | HR 91 | Wt 172.0 lb

## 2023-09-26 DIAGNOSIS — Z3A20 20 weeks gestation of pregnancy: Secondary | ICD-10-CM

## 2023-09-26 DIAGNOSIS — O4442 Low lying placenta NOS or without hemorrhage, second trimester: Secondary | ICD-10-CM | POA: Diagnosis not present

## 2023-09-26 DIAGNOSIS — Z3480 Encounter for supervision of other normal pregnancy, unspecified trimester: Secondary | ICD-10-CM

## 2023-09-26 DIAGNOSIS — O09529 Supervision of elderly multigravida, unspecified trimester: Secondary | ICD-10-CM

## 2023-09-26 DIAGNOSIS — O4440 Low lying placenta NOS or without hemorrhage, unspecified trimester: Secondary | ICD-10-CM | POA: Insufficient documentation

## 2023-09-26 DIAGNOSIS — N898 Other specified noninflammatory disorders of vagina: Secondary | ICD-10-CM | POA: Insufficient documentation

## 2023-09-26 NOTE — Assessment & Plan Note (Addendum)
-  1.5 cm from os at anatomy scan -Will recheck at f/u US -Reviewed bleeding precautions

## 2023-09-26 NOTE — Assessment & Plan Note (Addendum)
-  Anticipatory guidance about 2nd trimester -Vaginal swab recollected per pt request -Anatomy scan incomplete. Repeat in 4 weeks. -S>D today, normal growth on anatomy scan

## 2023-09-26 NOTE — Progress Notes (Signed)
    Return Prenatal Note   Assessment/Plan   Plan  43 y.o. [id number] at [redacted]w[redacted]d presents for follow-up OB visit. Reviewed prenatal record including previous visit note.  Low-lying placenta -1.5 cm from os at anatomy scan -Will recheck at f/u US -Reviewed bleeding precautions  Supervision of other normal pregnancy, antepartum -Anticipatory guidance about 2nd trimester -Vaginal swab recollected per pt request -Anatomy scan incomplete. Repeat in 4 weeks.    No orders of the defined types were placed in this encounter.  Return in about 4 weeks (around 10/24/2023).   Future Appointments  Date Time Provider Department Center  10/27/2023  9:15 AM A[facility]-AOB [place] 1 AOB-IMG None  10/27/2023 10:15 AM Glenetta Borg, CNM AOB-AOB None    For next visit:  Routine prenatal care    Subjective   Jayonna feels her itching has improved after taking Diflucan but would like to recollect swabs. She reports that she is sweaty a lot. Questions regarding anatomy scan and growth.  Movement: Present Contractions: Not present  Objective   Flow sheet Vitals: Pulse Rate: 91 BP: 104/67 Fundal Height: 24 cm Fetal Heart Rate (bpm): 157 Total weight gain: 22 lb (9.979 kg)  General Appearance  No acute distress, well appearing, and well nourished Pulmonary   Normal work of breathing Neurologic   Alert and oriented to person, place, and time Psychiatric   Mood and affect within normal limits  Guadlupe Spanish, CNM 09/26/23 4:11 PM

## 2023-09-27 LAB — CERVICOVAGINAL ANCILLARY ONLY
Bacterial Vaginitis (gardnerella): POSITIVE — AB
Candida Glabrata: NEGATIVE
Candida Vaginitis: NEGATIVE
Comment: NEGATIVE
Comment: NEGATIVE
Comment: NEGATIVE

## 2023-09-28 ENCOUNTER — Other Ambulatory Visit: Payer: Self-pay

## 2023-09-28 ENCOUNTER — Telehealth: Payer: Self-pay

## 2023-09-28 DIAGNOSIS — N760 Acute vaginitis: Secondary | ICD-10-CM

## 2023-09-28 MED ORDER — METRONIDAZOLE 500 MG PO TABS: 500.0000 mg | ORAL_TABLET | Freq: Two times a day (BID) | ORAL | 0 refills | Status: DC

## 2023-09-28 NOTE — Telephone Encounter (Signed)
 Left voicemail. Advised rx sent to pharmacy on file. Return call with any questions/concerns.

## 2023-09-28 NOTE — Telephone Encounter (Signed)
 TRIAGE VOICEMAIL: Patient reports she was seen on 09/26/23 and has seen her swab results. Inquiring about treatment.

## 2023-09-28 NOTE — Progress Notes (Signed)
 Rx sent for BV. Patient aware.

## 2023-10-06 ENCOUNTER — Telehealth: Payer: Self-pay

## 2023-10-06 NOTE — Telephone Encounter (Signed)
 Pt calling triage needing an appointment for a self swab for BV. Please call and add to nurse schedule

## 2023-10-07 ENCOUNTER — Other Ambulatory Visit (HOSPITAL_COMMUNITY)
Admission: RE | Admit: 2023-10-07 | Discharge: 2023-10-07 | Disposition: A | Discharge status: Home / Self Care | Source: Ambulatory Visit | Attending: Obstetrics | Admitting: Obstetrics

## 2023-10-07 ENCOUNTER — Ambulatory Visit (INDEPENDENT_AMBULATORY_CARE_PROVIDER_SITE_OTHER): Payer: Managed Care, Other (non HMO)

## 2023-10-07 VITALS — BP 98/69 | Ht 64.0 in | Wt 178.9 lb

## 2023-10-07 DIAGNOSIS — N760 Acute vaginitis: Secondary | ICD-10-CM

## 2023-10-07 DIAGNOSIS — B9689 Other specified bacterial agents as the cause of diseases classified elsewhere: Secondary | ICD-10-CM | POA: Diagnosis present

## 2023-10-07 DIAGNOSIS — B379 Candidiasis, unspecified: Secondary | ICD-10-CM

## 2023-10-07 MED ORDER — FLUCONAZOLE 150 MG PO TABS
150.0000 mg | ORAL_TABLET | Freq: Every day | ORAL | 0 refills | Status: DC
Start: 2023-10-07 — End: 2023-10-27

## 2023-10-07 NOTE — Patient Instructions (Signed)
 Vaginal Infection (Bacterial Vaginosis): What to Know  Bacterial vaginosis is an infection of the vagina. It happens when the balance of normal germs (bacteria) in the vagina changes. If you don't get treated, it can make it easier for you to get other infections from sex. These are called sexually transmitted infections (STIs). If you're pregnant, you need to get treated right away. This infection can cause a baby to be born early or at a low birth weight. What are the causes? This infection happens when too many harmful germs grow in the vagina. You can't get this infection from toilet seats, bedsheets, swimming pools, or things that touch your vagina. What increases the risk? Having sex with a new person or more than one person. Having sex without protection. Douching. Having an intrauterine device (IUD). Smoking. Using drugs or drinking alcohol. These can lead you to do risky things. Taking certain antibiotics. Being pregnant. What are the signs or symptoms? Some females have no symptoms. Symptoms may include: A gray or white discharge from your vagina. It can be watery or foamy. A fishy smell. This can happen after sex or during your menstrual period. Itching in and around your vagina. Burning or pain when you pee. How is this treated? This infection is treated with antibiotics. These may be given to you as: A pill. A cream for your vagina. A medicine that you put into your vagina (suppository). If the infection comes back, you may need more antibiotics. Follow these instructions at home: Medicines Take your medicines as told. Take or use your antibiotics as told. Do not stop using them even if you start to feel better. General instructions If the person you have sex with is a female, tell her that you have this infection. She will need to follow up with her doctor. Female partners don't need to be treated. Do not have sex until you finish treatment. Drink more fluids as  told. Keep your vagina and butt clean. Wash these areas with warm water each day. Wipe from front to back after you poop. If you're breastfeeding a baby, talk to your doctor if you should keep doing so during treatment. How is this prevented? Self-care Do not douche. Do not use deodorant sprays on your vagina. Wear cotton underwear. Do not wear tight pants and pantyhose, especially in the summer. Safe sex Use condoms the correct way and every time you have sex. Use dental dams to protect yourself during oral sex. Limit how many people you have sex with. Get tested for STIs. The person you have sex with should also get tested. Drugs and alcohol Do not smoke, vape, or use nicotine or tobacco. Do not use drugs. Limit the amount of alcohol you drink because it can lead you to do risky things. Where to find more information To learn more: Go to TonerPromos.no. Click Health Topics A-Z. Type "bacterial vaginosis" in the search bar. American Sexual Health Association (ASHA): ashasexualhealth.org U.S. Department of Health and [employer], Office on Women's Health: TravelLesson.ca Contact a doctor if: Your symptoms don't get better, even after treatment. You have more discharge or pain when you pee. You have a fever or chills. You have pain in your belly or in the area between your hips. You have pain during sex. You bleed from your vagina between menstrual periods. This information is not intended to replace advice given to you by your health care provider. Make sure you discuss any questions you have with your health care provider. Document  Revised: 01/12/2023 Document Reviewed: 01/12/2023 Elsevier Patient Education  2024 [employer].

## 2023-10-07 NOTE — Progress Notes (Signed)
    NURSE VISIT NOTE  Subjective:    Patient ID: Theresa Roberts, female    DOB: [birth year], 43 y.o.   MRN: [id number]  HPI  Patient is a 43 y.o. G38P1102 female who presents for test pf cure for bacterial vaginosis. Denies  vaginal discharge and odor, abnormal vaginal bleeding or significant pelvic pain or fever. denies dysuria, hematuria, urinary urgency, abdominal pain, and pelvic pain. Patient denies history of known exposure to STD.   Objective:    BP 98/69   Ht 5\' 4"  (1.626 m)   Wt 178 lb 14.4 oz (81.1 kg)   LMP 05/07/2023 (Exact Date) Comment: shorter and earlier than usual, prior to that was 04/11/23  BMI 30.71 kg/m    @THIS  VISIT ONLY@  Assessment:   1. Bacterial vaginosis     bacterial vaginosis  Plan:   GC and chlamydia DNA  probe sent to lab. Treatment: Diflucan 150 mg once ROV prn if symptoms persist or worsen.   Santiago Bumpers, CMA [facility]

## 2023-10-11 ENCOUNTER — Telehealth: Payer: Self-pay

## 2023-10-11 LAB — CERVICOVAGINAL ANCILLARY ONLY
Bacterial Vaginitis (gardnerella): NEGATIVE
Candida Glabrata: NEGATIVE
Candida Vaginitis: NEGATIVE
Chlamydia: NEGATIVE
Comment: NEGATIVE
Comment: NEGATIVE
Comment: NEGATIVE
Comment: NEGATIVE
Comment: NEGATIVE
Comment: NORMAL
Neisseria Gonorrhea: NEGATIVE
Trichomonas: NEGATIVE

## 2023-10-11 NOTE — Telephone Encounter (Signed)
 Patient advised swab is being processed and should result today. Advised to reach back out to [place] if she receives results and it is positive so that we can send rx.

## 2023-10-11 NOTE — Telephone Encounter (Signed)
 TRIAGE VOICEMAIL: Patient inquiring about 10/07/23 swab results.

## 2023-10-12 ENCOUNTER — Encounter: Payer: Self-pay | Admitting: Obstetrics

## 2023-10-17 ENCOUNTER — Encounter: Payer: Self-pay | Admitting: Nurse Practitioner

## 2023-10-17 ENCOUNTER — Ambulatory Visit: Admitting: Nurse Practitioner

## 2023-10-17 VITALS — BP 111/67 | HR 111 | Ht 64.0 in | Wt 174.0 lb

## 2023-10-17 DIAGNOSIS — R300 Dysuria: Secondary | ICD-10-CM | POA: Diagnosis not present

## 2023-10-17 LAB — POCT URINALYSIS DIPSTICK (MANUAL)
Nitrite, UA: NEGATIVE
Poct Glucose: NORMAL mg/dL
Poct Urobilinogen: NORMAL mg/dL
Spec Grav, UA: 1.025 (ref 1.010–1.025)
pH, UA: 7 (ref 5.0–8.0)

## 2023-10-17 NOTE — Progress Notes (Signed)
 BP 111/67 (BP Location: Right Arm, Patient Position: Sitting, Cuff Size: Large)   Pulse (!) 111   Ht 5\' 4"  (1.626 m)   Wt 174 lb (78.9 kg)   LMP 05/07/2023 (Exact Date) Comment: shorter and earlier than usual, prior to that was 04/11/23  SpO2 98%   BMI 29.87 kg/m    Subjective:    Patient ID: Theresa Roberts, female    DOB: [birth year], 43 y.o.   MRN: [id number]  HPI: Theresa Roberts is a 43 y.o. female  Chief Complaint  Patient presents with   Urinary Tract Infection    Possible    Urinary Frequency    No burning sensations, sweating   URINARY SYMPTOMS Patient states she is having a lot of moisture in the area.  No pain with urination.  She is sweating a lot more.  Feels more raw in the vaginal area due to the sweating.  She has been seen at GYN twice and once she had BV and once she had yeast.  She has been treated for both of these.  However, she feels like something still isn't right, but feels like it could be hormones.   Relevant past medical, surgical, family and social history reviewed and updated as indicated. Interim medical history since our last visit reviewed. Allergies and medications reviewed and updated.  Review of Systems  Genitourinary:        Raw vaginal area    Per HPI unless specifically indicated above     Objective:    BP 111/67 (BP Location: Right Arm, Patient Position: Sitting, Cuff Size: Large)   Pulse (!) 111   Ht 5\' 4"  (1.626 m)   Wt 174 lb (78.9 kg)   LMP 05/07/2023 (Exact Date) Comment: shorter and earlier than usual, prior to that was 04/11/23  SpO2 98%   BMI 29.87 kg/m   Wt Readings from Last 3 Encounters:  10/17/23 174 lb (78.9 kg)  10/07/23 178 lb 14.4 oz (81.1 kg)  09/26/23 172 lb (78 kg)    Physical Exam Vitals and nursing note reviewed.  Constitutional:      General: She is not in acute distress.    Appearance: Normal appearance. She is normal weight. She is not ill-appearing, toxic-appearing or diaphoretic.  HENT:      Head: Normocephalic.     Right Ear: External ear normal.     Left Ear: External ear normal.     Nose: Nose normal.     Mouth/Throat:     Mouth: Mucous membranes are moist.     Pharynx: Oropharynx is clear.  Eyes:     General:        Right eye: No discharge.        Left eye: No discharge.     Extraocular Movements: Extraocular movements intact.     Conjunctiva/sclera: Conjunctivae normal.     Pupils: Pupils are equal, round, and reactive to light.  Cardiovascular:     Rate and Rhythm: Normal rate and regular rhythm.     Heart sounds: No murmur heard. Pulmonary:     Effort: Pulmonary effort is normal. No respiratory distress.     Breath sounds: Normal breath sounds. No wheezing or rales.  Musculoskeletal:     Cervical back: Normal range of motion and neck supple.  Skin:    General: Skin is warm and dry.     Capillary Refill: Capillary refill takes less than 2 seconds.  Neurological:     General: No focal  deficit present.     Mental Status: She is alert and oriented to person, place, and time. Mental status is at baseline.  Psychiatric:        Mood and Affect: Mood normal.        Behavior: Behavior normal.        Thought Content: Thought content normal.        Judgment: Judgment normal.     Results for orders placed or performed in visit on 10/17/23  POCT Urinalysis Dip Manual   Collection Time: 10/17/23 12:00 PM  Result Value Ref Range   Spec Grav, UA 1.025 1.010 - 1.025   pH, UA 7.0 5.0 - 8.0   Leukocytes, UA Trace (A) Negative   Nitrite, UA Negative Negative   Poct Protein trace Negative, trace mg/dL   Poct Glucose Normal Normal mg/dL   Poct Ketones + small (A) Negative   Poct Urobilinogen Normal Normal mg/dL   Poct Bilirubin ++ (A) Negative   Poct Blood trace Negative, trace      Assessment & Plan:   Problem List Items Addressed This Visit   None Visit Diagnoses       Dysuria    -  Primary   UA unremarkable. Will sound out for culture to ensure bacteria  does not grow.  Patient will return to leave a wet prep.  Will make recommendations based on labs   Relevant Orders   POCT Urinalysis Dip Manual (Completed)   Urine Culture   WET PREP FOR TRICH, YEAST, CLUE        Follow up plan: Return if symptoms worsen or fail to improve.

## 2023-10-18 ENCOUNTER — Other Ambulatory Visit

## 2023-10-18 ENCOUNTER — Ambulatory Visit: Payer: Self-pay | Admitting: Family Medicine

## 2023-10-18 DIAGNOSIS — R300 Dysuria: Secondary | ICD-10-CM

## 2023-10-18 LAB — WET PREP FOR TRICH, YEAST, CLUE
Clue Cell Exam: POSITIVE — AB
Trichomonas Exam: NEGATIVE
Yeast Exam: NEGATIVE

## 2023-10-18 NOTE — Telephone Encounter (Signed)
 Called pt back per pt request: informed pt that at present time her PCP has not provided any information r/t her results in order for me to share.  Informed pt that I will pass along a message to PCP to make PCP aware results from urine are available and request pt callback with results

## 2023-10-19 ENCOUNTER — Encounter: Payer: Self-pay | Admitting: Nurse Practitioner

## 2023-10-19 LAB — URINE CULTURE

## 2023-10-19 MED ORDER — METRONIDAZOLE 500 MG PO TABS: 500.0000 mg | ORAL_TABLET | Freq: Two times a day (BID) | ORAL | 0 refills | Status: AC

## 2023-10-19 NOTE — Addendum Note (Signed)
 Addended by: Larae Grooms on: 10/19/2023 08:14 AM   Modules accepted: Orders

## 2023-10-27 ENCOUNTER — Ambulatory Visit: Payer: Managed Care, Other (non HMO)

## 2023-10-27 ENCOUNTER — Ambulatory Visit: Payer: Managed Care, Other (non HMO) | Admitting: Obstetrics

## 2023-10-27 ENCOUNTER — Other Ambulatory Visit: Payer: Self-pay | Admitting: Obstetrics and Gynecology

## 2023-10-27 ENCOUNTER — Encounter: Payer: Self-pay | Admitting: Obstetrics

## 2023-10-27 VITALS — BP 108/66 | HR 93 | Wt 174.0 lb

## 2023-10-27 DIAGNOSIS — O4442 Low lying placenta NOS or without hemorrhage, second trimester: Secondary | ICD-10-CM

## 2023-10-27 DIAGNOSIS — Z3A24 24 weeks gestation of pregnancy: Secondary | ICD-10-CM | POA: Diagnosis not present

## 2023-10-27 DIAGNOSIS — Z362 Encounter for other antenatal screening follow-up: Secondary | ICD-10-CM | POA: Diagnosis not present

## 2023-10-27 DIAGNOSIS — Z113 Encounter for screening for infections with a predominantly sexual mode of transmission: Secondary | ICD-10-CM

## 2023-10-27 DIAGNOSIS — O09529 Supervision of elderly multigravida, unspecified trimester: Secondary | ICD-10-CM | POA: Diagnosis not present

## 2023-10-27 DIAGNOSIS — O2342 Unspecified infection of urinary tract in pregnancy, second trimester: Secondary | ICD-10-CM | POA: Diagnosis not present

## 2023-10-27 DIAGNOSIS — O4440 Low lying placenta NOS or without hemorrhage, unspecified trimester: Secondary | ICD-10-CM

## 2023-10-27 DIAGNOSIS — Z131 Encounter for screening for diabetes mellitus: Secondary | ICD-10-CM

## 2023-10-27 DIAGNOSIS — N760 Acute vaginitis: Secondary | ICD-10-CM

## 2023-10-27 DIAGNOSIS — Z3480 Encounter for supervision of other normal pregnancy, unspecified trimester: Secondary | ICD-10-CM

## 2023-10-27 DIAGNOSIS — D649 Anemia, unspecified: Secondary | ICD-10-CM

## 2023-10-27 DIAGNOSIS — B9689 Other specified bacterial agents as the cause of diseases classified elsewhere: Secondary | ICD-10-CM | POA: Insufficient documentation

## 2023-10-27 LAB — POCT URINALYSIS DIPSTICK OB
Appearance: NORMAL
Bilirubin, UA: NEGATIVE
Blood, UA: NEGATIVE
Glucose, UA: NEGATIVE
Ketones, UA: NEGATIVE
Leukocytes, UA: NEGATIVE
Nitrite, UA: NEGATIVE
POC,PROTEIN,UA: NEGATIVE
Spec Grav, UA: 1.02 (ref 1.010–1.025)
Urobilinogen, UA: 0.2 U/dL
pH, UA: 7.5 (ref 5.0–8.0)

## 2023-10-27 MED ORDER — METRONIDAZOLE 0.75 % VA GEL
1.0000 | Freq: Every day | VAGINAL | 5 refills | Status: DC
Start: 2023-10-27 — End: 2023-12-08

## 2023-10-27 MED ORDER — FLUCONAZOLE 150 MG PO TABS: 150.0000 mg | ORAL_TABLET | Freq: Once | ORAL | 0 refills | Status: AC

## 2023-10-27 NOTE — Assessment & Plan Note (Addendum)
-   Resolved- 3.5 cm to os on 3/20 [place]

## 2023-10-27 NOTE — Assessment & Plan Note (Signed)
-   Recurrent infection  - Educated to take Metrogel .75% twice a week vaginally for the rest of the pregnancy

## 2023-10-27 NOTE — Assessment & Plan Note (Addendum)
-   Reviewed anticipatory guidance.  - 28 week labs and glucose test discussed for next visit. Alternative drinks list handout given.  - Reviewed danger signs and when to present.

## 2023-10-27 NOTE — Progress Notes (Signed)
    Return Prenatal Note   Assessment/Plan   Plan  43 y.o. [id number] at [redacted]w[redacted]d presents for follow-up OB visit. Reviewed prenatal record including previous visit note.  BV (bacterial vaginosis) - Recurrent infection  - Educated to take Metrogel .75% twice a week vaginally for the rest of the pregnancy   Supervision of other normal pregnancy, antepartum - Reviewed anticipatory guidance.  - 28 week labs and glucose test discussed for next visit. Alternative drinks list handout given.  - Reviewed danger signs and when to present.   Low-lying placenta - Resolved- 3.5 cm to os on 3/20 [place]     Orders Placed This Encounter  Procedures   28 Week RH+Panel    Standing Status:   Future    Expected Date:   11/27/2023    Expiration Date:   10/26/2024   POC Urinalysis Dipstick OB   Return in about 4 weeks (around 11/24/2023).   Future Appointments  Date Time Provider Department Center  11/24/2023  8:20 AM [facility] None  11/24/2023  8:55 AM Free, Lindalou Hose, CNM [facility] None    For next visit:  ROB with 28-week labs and TDaP    Subjective  Feeling well overall . She is frustrated with the recurrent BV and yeast and is on her last tablet of flagyl today. She has made lifestyle changes to try and help but those have not helped.  Denies LOF, VB.   Movement: Present Contractions: Irritability  Objective   Flow sheet Vitals: Pulse Rate: 93 BP: 108/66 Fundal Height: 26 cm Fetal Heart Rate (bpm): 140 Total weight gain: 10.9 kg  General Appearance  No acute distress, well appearing, and well nourished Pulmonary   Normal work of breathing Neurologic   Alert and oriented to person, place, and time Psychiatric   Mood and affect within normal limits  Brayton Caves, [facility]  10/27/23 11:08 AM

## 2023-11-15 ENCOUNTER — Encounter: Payer: Self-pay | Admitting: Certified Nurse Midwife

## 2023-11-24 ENCOUNTER — Ambulatory Visit (INDEPENDENT_AMBULATORY_CARE_PROVIDER_SITE_OTHER)

## 2023-11-24 ENCOUNTER — Other Ambulatory Visit (HOSPITAL_COMMUNITY)
Admission: RE | Admit: 2023-11-24 | Discharge: 2023-11-24 | Disposition: A | Discharge status: Home / Self Care | Source: Ambulatory Visit

## 2023-11-24 ENCOUNTER — Other Ambulatory Visit

## 2023-11-24 VITALS — BP 100/60 | HR 106 | Wt 177.0 lb

## 2023-11-24 DIAGNOSIS — F411 Generalized anxiety disorder: Secondary | ICD-10-CM | POA: Diagnosis present

## 2023-11-24 DIAGNOSIS — O09529 Supervision of elderly multigravida, unspecified trimester: Secondary | ICD-10-CM | POA: Insufficient documentation

## 2023-11-24 DIAGNOSIS — N898 Other specified noninflammatory disorders of vagina: Secondary | ICD-10-CM | POA: Diagnosis present

## 2023-11-24 DIAGNOSIS — O99891 Other specified diseases and conditions complicating pregnancy: Secondary | ICD-10-CM | POA: Diagnosis not present

## 2023-11-24 DIAGNOSIS — Z113 Encounter for screening for infections with a predominantly sexual mode of transmission: Secondary | ICD-10-CM

## 2023-11-24 DIAGNOSIS — Z3480 Encounter for supervision of other normal pregnancy, unspecified trimester: Secondary | ICD-10-CM | POA: Diagnosis present

## 2023-11-24 DIAGNOSIS — F4312 Post-traumatic stress disorder, chronic: Secondary | ICD-10-CM | POA: Diagnosis present

## 2023-11-24 DIAGNOSIS — O09523 Supervision of elderly multigravida, third trimester: Secondary | ICD-10-CM

## 2023-11-24 DIAGNOSIS — O26899 Other specified pregnancy related conditions, unspecified trimester: Secondary | ICD-10-CM | POA: Insufficient documentation

## 2023-11-24 DIAGNOSIS — Z3A24 24 weeks gestation of pregnancy: Secondary | ICD-10-CM

## 2023-11-24 DIAGNOSIS — Z131 Encounter for screening for diabetes mellitus: Secondary | ICD-10-CM

## 2023-11-24 DIAGNOSIS — D649 Anemia, unspecified: Secondary | ICD-10-CM

## 2023-11-24 DIAGNOSIS — Z3A28 28 weeks gestation of pregnancy: Secondary | ICD-10-CM | POA: Diagnosis not present

## 2023-11-24 MED ORDER — FLUCONAZOLE 150 MG PO TABS: 150.0000 mg | ORAL_TABLET | Freq: Once | ORAL | 1 refills | Status: AC

## 2023-11-24 NOTE — Addendum Note (Signed)
 Addended by: Fred Jacobsen on: 11/24/2023 09:43 AM   Modules accepted: Orders

## 2023-11-24 NOTE — Progress Notes (Addendum)
    Return Prenatal Note   Assessment/Plan   Plan  43 y.o. Z6X0960 at [redacted]w[redacted]d presents for follow-up OB visit. Reviewed prenatal record including previous visit note.  AMA (advanced maternal age) multigravida 35+ Discussed plan for increased pregnancy surveillance starting at 34 weeks, and possibility of need for IOL by EDD. Pt agreeable to plan of care.  Supervision of other normal pregnancy, antepartum 28 week labs and screenings today. No current pregnancy concerns, pt experiencing continued struggles with vaginal discharge in setting of recent BV/Yeast infections. Reviewed normal pregnancy changes in T3 and warning signs as well as PTL s/s.   Vaginal discharge during pregnancy Yeast swab collected today for continued chunky discharge. Reviewed theoretical risks associated with diflucan use in pregnancy vs potential benefits given her ongoing issues with management of yeast and BV. After this discussion, diflucan script sent to pts pharmacy, advised to wait for final lab result confirming yeast before taking Diflucan.    No orders of the defined types were placed in this encounter.  Return in about 2 weeks (around 12/08/2023) for ROB.   Future Appointments  Date Time Provider Department Center  12/08/2023 10:35 AM Zenobia Hila, MD [facility] None    For next visit:  continue with routine prenatal care     Subjective   43 y.o. [id number] at [redacted]w[redacted]d presents for this follow-up prenatal visit.  Patient doing well overall, continued struggles with abnormal discharge despite treatment. No OB concerns.  Patient reports: Movement: Present Contractions: Not present  Objective   Flow sheet Vitals: Pulse Rate: (!) 106 BP: 100/60 Fundal Height: 29 cm Fetal Heart Rate (bpm): 154 Total weight gain: 27 lb (12.2 kg)  General Appearance  No acute distress, well appearing, and well nourished Pulmonary   Normal work of breathing Neurologic   Alert and oriented to person, place, and  time Psychiatric   Mood and affect within normal limits  Verita Glassman Free, CNM  04/17/259:42 AM

## 2023-11-24 NOTE — Assessment & Plan Note (Signed)
 28 week labs and screenings today. No current pregnancy concerns, pt experiencing continued struggles with vaginal discharge in setting of recent BV/Yeast infections. Reviewed normal pregnancy changes in T3 and warning signs as well as PTL s/s.

## 2023-11-24 NOTE — Assessment & Plan Note (Addendum)
 Yeast swab collected today for continued chunky discharge. Reviewed theoretical risks associated with diflucan use in pregnancy vs potential benefits given her ongoing issues with management of yeast and BV. After this discussion, diflucan script sent to pts pharmacy, advised to wait for final lab result confirming yeast before taking Diflucan.

## 2023-11-24 NOTE — Assessment & Plan Note (Signed)
 Discussed plan for increased pregnancy surveillance starting at 34 weeks, and possibility of need for IOL by EDD. Pt agreeable to plan of care.

## 2023-11-25 LAB — 28 WEEK RH+PANEL
Basophils Absolute: 0 10*3/uL (ref 0.0–0.2)
Basos: 1 %
EOS (ABSOLUTE): 0.1 10*3/uL (ref 0.0–0.4)
Eos: 1 %
Gestational Diabetes Screen: 92 mg/dL (ref 70–139)
HIV Screen 4th Generation wRfx: NONREACTIVE
Hematocrit: 31.3 % — ABNORMAL LOW (ref 34.0–46.6)
Hemoglobin: 10.5 g/dL — ABNORMAL LOW (ref 11.1–15.9)
Immature Grans (Abs): 0 10*3/uL (ref 0.0–0.1)
Immature Granulocytes: 0 %
Lymphocytes Absolute: 1.6 10*3/uL (ref 0.7–3.1)
Lymphs: 21 %
MCH: 30.8 pg (ref 26.6–33.0)
MCHC: 33.5 g/dL (ref 31.5–35.7)
MCV: 92 fL (ref 79–97)
Monocytes Absolute: 0.6 10*3/uL (ref 0.1–0.9)
Monocytes: 8 %
Neutrophils Absolute: 5.2 10*3/uL (ref 1.4–7.0)
Neutrophils: 69 %
Platelets: 268 10*3/uL (ref 150–450)
RBC: 3.41 x10E6/uL — ABNORMAL LOW (ref 3.77–5.28)
RDW: 11.8 % (ref 11.7–15.4)
RPR Ser Ql: NONREACTIVE
WBC: 7.6 10*3/uL (ref 3.4–10.8)

## 2023-11-25 LAB — CERVICOVAGINAL ANCILLARY ONLY
Bacterial Vaginitis (gardnerella): POSITIVE — AB
Candida Glabrata: NEGATIVE
Candida Vaginitis: NEGATIVE
Comment: NEGATIVE
Comment: NEGATIVE
Comment: NEGATIVE

## 2023-11-27 ENCOUNTER — Encounter: Payer: Self-pay | Admitting: Obstetrics

## 2023-11-28 ENCOUNTER — Other Ambulatory Visit: Payer: Self-pay

## 2023-11-28 DIAGNOSIS — B9689 Other specified bacterial agents as the cause of diseases classified elsewhere: Secondary | ICD-10-CM

## 2023-11-28 MED ORDER — CLINDAMYCIN HCL 300 MG PO CAPS
300.0000 mg | ORAL_CAPSULE | Freq: Two times a day (BID) | ORAL | 0 refills | Status: AC
Start: 2023-11-28 — End: 2023-12-05

## 2023-11-28 NOTE — Telephone Encounter (Signed)
 Pt was responded to by other provider.

## 2023-12-08 ENCOUNTER — Other Ambulatory Visit (HOSPITAL_COMMUNITY)
Admission: RE | Admit: 2023-12-08 | Discharge: 2023-12-08 | Disposition: A | Discharge status: Home / Self Care | Source: Ambulatory Visit | Attending: Obstetrics and Gynecology | Admitting: Obstetrics and Gynecology

## 2023-12-08 ENCOUNTER — Encounter: Payer: Self-pay | Admitting: Obstetrics and Gynecology

## 2023-12-08 ENCOUNTER — Ambulatory Visit: Admitting: Obstetrics and Gynecology

## 2023-12-08 VITALS — BP 99/60 | HR 94 | Wt 180.4 lb

## 2023-12-08 DIAGNOSIS — Z3480 Encounter for supervision of other normal pregnancy, unspecified trimester: Secondary | ICD-10-CM | POA: Diagnosis present

## 2023-12-08 DIAGNOSIS — Z3A30 30 weeks gestation of pregnancy: Secondary | ICD-10-CM | POA: Diagnosis not present

## 2023-12-08 DIAGNOSIS — N898 Other specified noninflammatory disorders of vagina: Secondary | ICD-10-CM | POA: Diagnosis present

## 2023-12-08 DIAGNOSIS — O09523 Supervision of elderly multigravida, third trimester: Secondary | ICD-10-CM

## 2023-12-08 DIAGNOSIS — O09529 Supervision of elderly multigravida, unspecified trimester: Secondary | ICD-10-CM

## 2023-12-08 DIAGNOSIS — O26899 Other specified pregnancy related conditions, unspecified trimester: Secondary | ICD-10-CM | POA: Insufficient documentation

## 2023-12-08 NOTE — Progress Notes (Signed)
 ROB:  EGA = 30.5.  Has been taking MetroGel  for recurrent vaginitis.  She has no symptoms at this time but wants to be "tested".  Reports daily fetal movement.  Taking aspirin  and vitamins.  Previous normal 1 hour GCT.

## 2023-12-08 NOTE — Progress Notes (Signed)
 ROB. Patient states daily fetal movement, occasional braxton hicks. Declines TDAP. Complaints of reoccurring yeast and BV, culture preformed today. Patient states no questions or concerns at this time.

## 2023-12-09 LAB — CERVICOVAGINAL ANCILLARY ONLY
Bacterial Vaginitis (gardnerella): NEGATIVE
Candida Glabrata: NEGATIVE
Candida Vaginitis: NEGATIVE
Comment: NEGATIVE
Comment: NEGATIVE
Comment: NEGATIVE

## 2023-12-23 ENCOUNTER — Ambulatory Visit (INDEPENDENT_AMBULATORY_CARE_PROVIDER_SITE_OTHER): Admitting: Advanced Practice Midwife

## 2023-12-23 ENCOUNTER — Encounter: Payer: Self-pay | Admitting: Advanced Practice Midwife

## 2023-12-23 VITALS — BP 96/54 | HR 92 | Wt 183.0 lb

## 2023-12-23 DIAGNOSIS — Z3480 Encounter for supervision of other normal pregnancy, unspecified trimester: Secondary | ICD-10-CM

## 2023-12-23 DIAGNOSIS — Z3483 Encounter for supervision of other normal pregnancy, third trimester: Secondary | ICD-10-CM

## 2023-12-23 DIAGNOSIS — Z3490 Encounter for supervision of normal pregnancy, unspecified, unspecified trimester: Secondary | ICD-10-CM | POA: Diagnosis not present

## 2023-12-23 DIAGNOSIS — Z3A32 32 weeks gestation of pregnancy: Secondary | ICD-10-CM | POA: Diagnosis not present

## 2023-12-23 NOTE — Patient Instructions (Signed)
Pain Relief During Labor and Delivery Many things can cause pain during labor and delivery, including: Pressure due to the baby moving through the pelvis. Stretching of tissues due to the baby moving through the birth canal. Muscle tension due to anxiety or nervousness. The uterus tightening (contracting)and relaxing to help move the baby. How do I get pain relief during labor and delivery?  Discuss your pain relief options with your health care provider during your prenatal visits. Explore the options offered by your hospital or birth center. There are many ways to deal with the pain of labor and delivery. You can try relaxation techniques or doing relaxing activities, taking a warm shower or bath (hydrotherapy), or other methods. There are also many medicines available to help control pain. Relaxation techniques and activities Practice relaxation techniques or do relaxing activities, such as: Focused breathing. Meditation. Visualization. Aroma therapy. Listening to your favorite music. Hypnosis. Hydrotherapy Take a warm shower or bath. This may: Provide comfort and relaxation. Lessen your feeling of pain. Reduce the amount of pain medicine needed. Shorten the length of labor. Other methods Try doing other things, such as: Getting a massage or having counterpressure on your back. Applying warm packs or ice packs. Changing positions often, moving around, or using a birthing ball. Medicines You may be given: Pain medicine through an IV or an injection into a muscle. Pain medicine inserted into your spinal column. Injections of sterile water just under the skin on your lower back. Nitrous oxide inhalation therapy, also called laughing gas. What kinds of medicine are available for pain relief? There are two kinds of medicines that can be used to relieve pain during labor and delivery: Analgesics. These medicines decrease pain without causing you to lose feeling or the ability to move  your muscles. Anesthetics. These medicines block feeling in the body and can decrease your ability to move freely. Both kinds of medicine can cause minor side effects, such as nausea, trouble concentrating, and sleepiness. They can also affect the baby's heart rate before birth and his or her breathing after birth. For this reason, health care providers are careful about when and how much medicine is given. Which medicines are used to provide pain relief? Common medicines The most common medicines used to help manage pain during labor and delivery include: Opioids. Opioids are medicines that decrease how much pain you feel (perception of pain). These medicines can be given through an IV or may be used with anesthetics to block pain. Epidural analgesia. Epidural analgesia is given through a very thin tube that is inserted into the lower back. Medicine is delivered continuously to the area near your spinal column nerves (epidural space). After having this treatment, you may be able to move your legs, but you will not be able to walk. Depending on the amount and type of medicine given, you may lose all feeling in the lower half of your body, or you may have some sensation, including the urge to push. This treatment can be used to give pain relief for a vaginal birth. Sometimes, a numbing medicine is injected into the spinal fluid when an epidural catheter is placed. This provides for immediate relief but only lasts for 1-2 hours. Once it wears off, the epidural will provide pain relief. This is called a combined spinal-epidural (CSE) block. Intrathecal analgesia (spinal analgesia). Intrathecal analgesia is similar to epidural analgesia, but the medicine is injected into the spinal fluid instead of the epidural space. It is usually only given once.  It starts to relieve pain quickly, but the pain relief lasts only 1-2 hours. Pudendal block. This block is done by injecting numbing medicine through the wall of  the vagina and into a nerve in the pelvis. Other medicines Other medicines used to help manage pain during labor and delivery include: Local anesthetics. These are used to numb a small area of the body. They may be used along with another kind of medicine or used to numb the nerves of the vagina, cervix, and perineum during the second stage of labor. Spinal block (spinal anesthesia). Spinal anesthesia is similar to spinal analgesia, but the medicine that is used contains longer-acting numbing medicines and pain medicines. This type of anesthesia can be used for a cesarean delivery and allows you to stay awake for the birth of your baby. General anesthetics cause you to lose consciousness so you do not feel pain. They are usually only used for an emergency cesarean delivery. These medicines are given through an IV or a mask or both. These medicines are used as part of a procedure or for an emergency delivery. Summary Women have many options to help them manage the pain associated with labor and delivery. You can try doing relaxing activities, taking a warm shower or bath, or other methods. There are also many medicines available to help control pain during labor and delivery. Talk with your health care provider about what options are available to you. This information is not intended to replace advice given to you by your health care provider. Make sure you discuss any questions you have with your health care provider. Document Revised: 07/19/2022 Document Reviewed: 07/19/2022 Elsevier Patient Education  2024 [employer] Signs and Symptoms of Labor Labor is the body's natural process of moving the baby and the placenta out of the uterus. The process of labor usually starts when the baby is full-term, between 39 and 41 weeks of pregnancy. Signs and symptoms that you are close to going into labor As your body prepares for labor and the birth of your baby, you may notice the following symptoms in  the weeks and days before true labor starts: Passing a small amount of thick, bloody mucus from your vagina. This is called normal bloody show or losing your mucus plug. This may happen more than a week before labor begins, or right before labor begins, as the opening of the cervix starts to widen (dilate). For some women, the entire mucus plug passes at once. For others, pieces of the mucus plug may gradually pass over several days. Your baby moving (dropping) lower in your pelvis to get into position for birth (lightening). When this happens, you may feel more pressure on your bladder and pelvic bone and less pressure on your ribs. This may make it easier to breathe. It may also cause you to need to urinate more often and have problems with bowel movements. Having "practice contractions," also called Braxton Hicks contractions or false labor. These occur at irregular (unevenly spaced) intervals that are more than 10 minutes apart. False labor contractions are common after exercise or sexual activity. They will stop if you change position, rest, or drink fluids. These contractions are usually mild and do not get stronger over time. They may feel like: A backache or back pain. Mild cramps, similar to menstrual cramps. Tightening or pressure in your abdomen. Other early symptoms include: Nausea or loss of appetite. Diarrhea. Having a sudden burst of energy, or feeling very tired. Mood changes. Having trouble  sleeping. Signs and symptoms that labor has begun Signs that you are in labor may include: Having contractions that come at regular (evenly spaced) intervals and increase in intensity. This may feel like more intense tightening or pressure in your abdomen that moves to your back. Contractions may also feel like rhythmic pain in your upper thighs or back that comes and goes at regular intervals. If you are delivering for the first time, this change in intensity of contractions often occurs at a  more gradual pace. If you have given birth before, you may notice a more rapid progression of contraction changes. Feeling pressure in the vaginal area. Your water breaking (rupture of membranes). This is when the sac of fluid that surrounds your baby breaks. Fluid leaking from your vagina may be clear or blood-tinged. Labor usually starts within 24 hours of your water breaking, but it may take longer to begin. Some people may feel a sudden gush of fluid; others may notice repeatedly damp underwear. Follow these instructions at home:  When labor starts, or if your water breaks, call your health care provider or nurse care line. Based on your situation, they will determine when you should go in for an exam. During early labor, you may be able to rest and manage symptoms at home. Some strategies to try at home include: Breathing and relaxation techniques. Taking a warm bath or shower. Listening to music. Using a heating pad on the lower back for pain. If directed, apply heat to the area as often as told by your health care provider. Use the heat source that your health care provider recommends, such as a moist heat pack or a heating pad. Place a towel between your skin and the heat source. Leave the heat on for 20-30 minutes. Remove the heat if your skin turns bright red. This is especially important if you are unable to feel pain, heat, or cold. You have a greater risk of getting burned. Contact a health care provider if: Your labor has started. Your water breaks. You have nausea, vomiting, or diarrhea. Get help right away if: You have painful, regular contractions that are 5 minutes apart or less. Labor starts before you are [redacted] weeks along in your pregnancy. You have a fever. You have bright red blood coming from your vagina. You do not feel your baby moving. You have a severe headache with or without vision problems. You have chest pain or shortness of breath. These symptoms may  represent a serious problem that is an emergency. Do not wait to see if the symptoms will go away. Get medical help right away. Call your local emergency services (911 in the [place] Do not drive yourself to the hospital. Summary Labor is your body's natural process of moving your baby and the placenta out of your uterus. The process of labor usually starts when your baby is full-term, between 47 and 40 weeks of pregnancy. When labor starts, or if your water breaks, call your health care provider or nurse care line. Based on your situation, they will determine when you should go in for an exam. This information is not intended to replace advice given to you by your health care provider. Make sure you discuss any questions you have with your health care provider. Document Revised: 12/09/2020 Document Reviewed: 12/09/2020 Elsevier Patient Education  2024 [employer].

## 2023-12-23 NOTE — Progress Notes (Signed)
 Routine Prenatal Care Visit  Subjective  Theresa Roberts is a 43 y.o. [id number] at [redacted]w[redacted]d being seen today for ongoing prenatal care.  She is currently monitored for the following issues for this high-risk pregnancy and has Anterolisthesis; Scoliosis; GAD (generalized anxiety disorder); Chronic post-traumatic stress disorder (PTSD); Supervision of other normal pregnancy, antepartum; and AMA (advanced maternal age) multigravida 35+ on their problem list.  ----------------------------------------------------------------------------------- Patient reports some pains related to fetal position. Recommend growth scan at next visit- size greater than dates today.  Start NSTs at 36 weeks. Reports polyhydramnios with G2.  Contractions: Irritability. Vag. Bleeding: None.  Movement: Present. Leaking Fluid denies.  ----------------------------------------------------------------------------------- The following portions of the patient's history were reviewed and updated as appropriate: allergies, current medications, past family history, past medical history, past social history, past surgical history and problem list. Problem list updated.  Objective  Blood pressure (!) 96/54, pulse 92, weight 183 lb (83 kg), last menstrual period 05/07/2023. Pregravid weight 150 lb (68 kg) Total Weight Gain 33 lb (15 kg) Urinalysis: Urine Protein    Urine Glucose    Fetal Status: Fetal Heart Rate (bpm): 153 Fundal Height: 35 cm Movement: Present     General:  Alert, oriented and cooperative. Patient is in no acute distress.  Skin: Skin is warm and dry. No rash noted.   Cardiovascular: Normal heart rate noted  Respiratory: Normal respiratory effort, no problems with respiration noted  Abdomen: Soft, gravid, appropriate for gestational age. Pain/Pressure: Absent     Pelvic:  Cervical exam deferred        Extremities: Normal range of motion.  Edema: Trace  Mental Status: Normal mood and affect. Normal behavior. Normal  judgment and thought content.   Assessment   43 y.o. [id number] at [redacted]w[redacted]d by  02/11/2024, by Last Menstrual Period presenting for routine prenatal visit  Plan   THIRD Problems (from 06/28/23 to present)     Problem Noted Diagnosed Resolved   AMA (advanced maternal age) multigravida 35+ 07/28/2023 by Frederich Jaksch, CNM  No   Overview Signed 11/24/2023  8:48 AM by Tammie Fall, CNM  Plan for AMA >31 yo by EDD: - Growth US  with AFI @ 34 weeks - Weekly NSTs starting at 36 weeks - Daily FKC - Consider delivery by EDD       Vaginal discharge during pregnancy 11/24/2023 by Laquita Plant, Student-MidWife  12/23/2023 by Angelita Kendall, CNM   Overview Signed 11/24/2023  9:10 AM by Laquita Plant, Student-MidWife  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 Now on maintenance Metrogel  for remainder of pregnancy.       Clinical Staff Provider  Office Location  [facility] Dating  8 wk US    Language  English Anatomy US     Flu Vaccine  Declined Genetic Screen  NIPS: neg, female  TDaP vaccine  Declined Hgb A1C or  GTT Early :5.3 Third trimester :   Covid    LAB RESULTS   Rhogam  AB/Positive/-- (12/19 1403)  Blood Type AB/Positive/-- (12/19 1403)   RSV Offer Antibody Negative (12/19 1403)  Feeding Plan Formula Rubella 3.30 (12/19 1403)  Contraception BC pills RPR Non Reactive (12/19 1403)   Circumcision Yes HBsAg Negative (12/19 1403)   Pediatrician  [facility] HIV Non Reactive (12/19 1403)  Support Person FOB Varicella Reactive (12/19 1403)  Prenatal Classes No GBS  (For PCN allergy, check sensitivities)     Hep C   NON reactive   BTL Consent  Pap No results  found for: "DIAGPAP"  VBAC Consent NA Hgb Electro      CF      SMA            Preterm labor symptoms and general obstetric precautions including but not limited to vaginal bleeding, contractions, leaking of fluid and fetal movement were reviewed in detail with the patient. Please refer to After Visit Summary  for other counseling recommendations.   Return in about 2 weeks (around 01/06/2024) for growth u/s and rob after.  Angelita Kendall, CNM 12/23/2023 11:44 AM

## 2023-12-29 DIAGNOSIS — Z0289 Encounter for other administrative examinations: Secondary | ICD-10-CM

## 2024-01-03 ENCOUNTER — Ambulatory Visit
Admission: RE | Admit: 2024-01-03 | Discharge: 2024-01-03 | Disposition: A | Discharge status: Home / Self Care | Source: Ambulatory Visit | Attending: Family Medicine | Admitting: Family Medicine

## 2024-01-03 DIAGNOSIS — Z3A35 35 weeks gestation of pregnancy: Secondary | ICD-10-CM | POA: Insufficient documentation

## 2024-01-03 DIAGNOSIS — Z3483 Encounter for supervision of other normal pregnancy, third trimester: Secondary | ICD-10-CM | POA: Insufficient documentation

## 2024-01-03 DIAGNOSIS — O3663X0 Maternal care for excessive fetal growth, third trimester, not applicable or unspecified: Secondary | ICD-10-CM | POA: Diagnosis not present

## 2024-01-03 DIAGNOSIS — Z3490 Encounter for supervision of normal pregnancy, unspecified, unspecified trimester: Secondary | ICD-10-CM | POA: Diagnosis present

## 2024-01-06 ENCOUNTER — Ambulatory Visit

## 2024-01-06 VITALS — BP 93/58 | HR 106 | Wt 183.4 lb

## 2024-01-06 DIAGNOSIS — O09523 Supervision of elderly multigravida, third trimester: Secondary | ICD-10-CM

## 2024-01-06 DIAGNOSIS — Z3A34 34 weeks gestation of pregnancy: Secondary | ICD-10-CM

## 2024-01-06 DIAGNOSIS — O99013 Anemia complicating pregnancy, third trimester: Secondary | ICD-10-CM

## 2024-01-06 DIAGNOSIS — D649 Anemia, unspecified: Secondary | ICD-10-CM

## 2024-01-06 DIAGNOSIS — O99019 Anemia complicating pregnancy, unspecified trimester: Secondary | ICD-10-CM

## 2024-01-06 DIAGNOSIS — Z3483 Encounter for supervision of other normal pregnancy, third trimester: Secondary | ICD-10-CM

## 2024-01-06 DIAGNOSIS — Z3480 Encounter for supervision of other normal pregnancy, unspecified trimester: Secondary | ICD-10-CM

## 2024-01-06 HISTORY — DX: Anemia complicating pregnancy, unspecified trimester: O99.019

## 2024-01-06 NOTE — Assessment & Plan Note (Addendum)
-   Reviewed kick counts and preterm labor warning signs. Instructed to call office or come to hospital with persistent headache, vision changes, regular contractions, leaking of fluid, decreased fetal movement or vaginal bleeding.   - decrease alt intake, can wear compresion socks, andelevate feed to help with swelling. - Growth Scan 5/27, results not yet final. Patient states she was told by US  tech that she is measuring 1 week ahead. Reviewed US  dating based on early US  and LMP and variability in US , particularly with measurements in the third trimester.  - Prepared for GBS and GC/CT screening at next visit.

## 2024-01-06 NOTE — Progress Notes (Signed)
    Return Prenatal Note   Assessment/Plan   Plan  43 y.o. [id number] at [redacted]w[redacted]d presents for follow-up OB visit. Reviewed prenatal record including previous visit note.  No problem-specific Assessment & Plan notes found for this encounter.    No orders of the defined types were placed in this encounter.  Return in about 1 week (around 01/13/2024) for ROB with NST.   Future Appointments  Date Time Provider Department Center  01/06/2024  8:55 AM Free, Verita Glassman, CNM [facility] None    For next visit:  Routine prenatal care    Subjective  Feeling good overall feeling more anxious about getting close to delivery, and ready for baby to come. She reports BH contractions that go  away with rest and hydration. She reports some increase swelling in her feet. Denies LOF, VB.   Movement: Present Contractions: Irritability  Objective   Flow sheet Vitals: Pulse Rate: (!) 106 BP: (!) 93/58 Total weight gain: 15.2 kg  General Appearance  No acute distress, well appearing, and well nourished Pulmonary   Normal work of breathing Neurologic   Alert and oriented to person, place, and time Psychiatric   Mood and affect within normal limits  Josue Nip, CNM 01/06/24 8:54 AM

## 2024-01-06 NOTE — Assessment & Plan Note (Signed)
-   Start weekly NSTs with next visit.

## 2024-01-06 NOTE — Progress Notes (Signed)
    Return Prenatal Note   Assessment/Plan   Plan  43 y.o. [id number] at [redacted]w[redacted]d presents for follow-up OB visit. Reviewed prenatal record including previous visit note.  Supervision of other normal pregnancy, antepartum - Reviewed kick counts and preterm labor warning signs. Instructed to call office or come to hospital with persistent headache, vision changes, regular contractions, leaking of fluid, decreased fetal movement or vaginal bleeding.   - decrease alt intake, can wear compresion socks, andelevate feed to help with swelling. - Growth Scan 5/27, results not yet final. Patient states she was told by US  tech that she is measuring 1 week ahead. Reviewed US  dating based on early US  and LMP and variability in US , particularly with measurements in the third trimester.  - Prepared for GBS and GC/CT screening at next visit.   AMA (advanced maternal age) multigravida 41+ - Start weekly NSTs with next visit.    No orders of the defined types were placed in this encounter.  Return in about 1 week (around 01/13/2024) for ROB with NST.   Future Appointments  Date Time Provider Department Center  01/13/2024  8:00 AM AOB-NST ROOM [facility] None  01/13/2024  8:15 AM Dominic, Alva Jewels, CNM [facility] None    For next visit:  ROB with GBS screening and NST     Subjective   43 y.o. [id number] at [redacted]w[redacted]d presents for this follow-up prenatal visit.  Patient has questions about her pregnancy dating, increased swelling, BH contractions.  Patient reports: Movement: Present Contractions: Irritability  Objective   Flow sheet Vitals: Pulse Rate: (!) 106 BP: (!) 93/58 Fundal Height: 36 cm Fetal Heart Rate (bpm): 147 Presentation: Vertex Total weight gain: 33 lb 6.4 oz (15.2 kg)  General Appearance  No acute distress, well appearing, and well nourished Pulmonary   Normal work of breathing Neurologic   Alert and oriented to person, place, and time Psychiatric   Mood and affect within normal  limits  Verita Glassman Nelissa Bolduc, CNM  05/30/259:26 AM

## 2024-01-09 ENCOUNTER — Other Ambulatory Visit: Payer: Self-pay | Admitting: Family Medicine

## 2024-01-10 ENCOUNTER — Ambulatory Visit: Payer: Self-pay

## 2024-01-10 ENCOUNTER — Telehealth: Payer: Self-pay

## 2024-01-10 NOTE — Telephone Encounter (Signed)
 Unable to refill per protocol, Rx expired. Discontinued 12/23/23.  Requested Prescriptions  Pending Prescriptions Disp Refills   acyclovir  (ZOVIRAX ) 800 MG tablet [Pharmacy Med Name: ACYCLOVIR  800MG  TABLETS] 60 tablet 12    Sig: TAKE 1 TABLET(800 MG) BY MOUTH TWICE DAILY     Antimicrobials:  Antiviral Agents - Anti-Herpetic Failed - 01/10/2024  2:35 PM      Failed - Valid encounter within last 12 months    Recent Outpatient Visits           2 months ago Dysuria   [facility] Aileen Alexanders, NP

## 2024-01-10 NOTE — Telephone Encounter (Signed)
 Patient called about her Anatomy or Growth scan she had done on 05/27. She would like to know the results. I informed patient that we are behind on reading u/s reports. I did contact radiology reading room to see if they could go ahead and read it. They will put it on the list. Patient has been informed.

## 2024-01-10 NOTE — Telephone Encounter (Signed)
 This encounter was created in error - please disregard.

## 2024-01-10 NOTE — Telephone Encounter (Addendum)
 Copied From CRM [id number].  Reason for Triage: The patient is pregnant and thinks she may have a yeast infection. She has irritation but denies pain and states she was advised to schedule with the lab for a swab instead of coming in for an office visit previously. The patient's call back number is [phone].   Patient would like a lab order to have a vaginal swab for possible yeast infection. Office to call patient back.

## 2024-01-11 ENCOUNTER — Ambulatory Visit: Payer: Self-pay | Admitting: Advanced Practice Midwife

## 2024-01-13 ENCOUNTER — Encounter: Payer: Self-pay | Admitting: Licensed Practical Nurse

## 2024-01-13 ENCOUNTER — Ambulatory Visit

## 2024-01-13 ENCOUNTER — Ambulatory Visit (INDEPENDENT_AMBULATORY_CARE_PROVIDER_SITE_OTHER): Admitting: Licensed Practical Nurse

## 2024-01-13 ENCOUNTER — Other Ambulatory Visit (HOSPITAL_COMMUNITY)
Admission: RE | Admit: 2024-01-13 | Discharge: 2024-01-13 | Disposition: A | Discharge status: Home / Self Care | Source: Ambulatory Visit | Attending: Licensed Practical Nurse | Admitting: Licensed Practical Nurse

## 2024-01-13 VITALS — BP 105/62 | HR 117 | Wt 183.3 lb

## 2024-01-13 DIAGNOSIS — Z3A35 35 weeks gestation of pregnancy: Secondary | ICD-10-CM

## 2024-01-13 DIAGNOSIS — Z113 Encounter for screening for infections with a predominantly sexual mode of transmission: Secondary | ICD-10-CM | POA: Diagnosis present

## 2024-01-13 DIAGNOSIS — O09523 Supervision of elderly multigravida, third trimester: Secondary | ICD-10-CM | POA: Diagnosis not present

## 2024-01-13 DIAGNOSIS — Z3685 Encounter for antenatal screening for Streptococcus B: Secondary | ICD-10-CM

## 2024-01-13 DIAGNOSIS — O09529 Supervision of elderly multigravida, unspecified trimester: Secondary | ICD-10-CM

## 2024-01-13 DIAGNOSIS — Z3480 Encounter for supervision of other normal pregnancy, unspecified trimester: Secondary | ICD-10-CM

## 2024-01-13 DIAGNOSIS — Z3483 Encounter for supervision of other normal pregnancy, third trimester: Secondary | ICD-10-CM

## 2024-01-13 DIAGNOSIS — O99013 Anemia complicating pregnancy, third trimester: Secondary | ICD-10-CM

## 2024-01-13 MED ORDER — FERROUS GLUCONATE 324 (38 FE) MG PO TABS: 324.0000 mg | ORAL_TABLET | Freq: Two times a day (BID) | ORAL | 3 refills | Status: DC

## 2024-01-13 NOTE — Progress Notes (Signed)
    NURSE VISIT NOTE  Subjective:    Patient ID: Theresa Roberts, female    DOB: September 10, 1980, 43 y.o.   MRN: [id number]  HPI  Patient is a 43 y.o. G10P1102 female who presents for fetal monitoring per order from Fred Jacobsen, [place].   Objective:    BP 105/62   Pulse (!) 117   Wt 183 lb 4.8 oz (83.1 kg)   LMP 05/07/2023 (Exact Date) Comment: shorter and earlier than usual, prior to that was 04/11/23  BMI 31.46 kg/m  Estimated Date of Delivery: 02/11/24  [redacted]w[redacted]d  Fetus A Non-Stress Test Interpretation for 01/13/24  Indication: Gestational Diabetes diet controlled and Obesity  Fetal Heart Rate A Mode: Doppler Baseline Rate (A): 155 bpm Variability: Moderate Accelerations: 15 x 15 Decelerations: None Multiple birth?: No  Uterine Activity Mode: Toco Contraction Frequency (min): RARE  Interpretation (Fetal Testing) Nonstress Test Interpretation: Reactive   Assessment:   1. Encounter for supervision of other normal pregnancy in third trimester   2. Antenatal screening for streptococcus B   3. Screening examination for STD (sexually transmitted disease)   4. Anemia during pregnancy in third trimester   5. Antepartum multigravida of advanced maternal age   41. Supervision of other normal pregnancy, antepartum   7. [redacted] weeks gestation of pregnancy      Plan:   Results reviewed and discussed with patient by  Anice Kerbs, CNM.     Doneen Fuelling, CMA [facility]

## 2024-01-13 NOTE — Progress Notes (Signed)
    Return Prenatal Note   Subjective   43 y.o. J4N8295 at [redacted]w[redacted]d presents for this follow-up prenatal visit.  Patient Doing ok, has typical third trimester discomforts that is making her uncomfortable and hopeful for an earlier birth. Has been craving ice, was told she is anemic OTC Iron  made her vomit so she stopped taking them.  Patient reports: Movement: Present Contractions: Regular  Objective   Flow sheet Vitals: Pulse Rate: (!) 117 BP: 105/62 Fundal Height: 39 cm Fetal Heart Rate (bpm): 155 Presentation: Vertex Total weight gain: 33 lb 4.8 oz (15.1 kg)  Baseline 155, moderate variability, pos accel, neg decel TOCO: few contractions   General Appearance  No acute distress, well appearing, and well nourished Pulmonary   Normal work of breathing Neurologic   Alert and oriented to person, place, and time Psychiatric   Mood and affect within normal limits  Assessment/Plan   Plan  42 y.o. A2Z3086 at [redacted]w[redacted]d presents for follow-up OB visit. Reviewed prenatal record including previous visit note.  AMA (advanced maternal age) multigravida 35+ -RNST -Desires IOL at 30wks -US  5/27 3122 gram, 98%, AFI 14, cephalic   Supervision of other normal pregnancy, antepartum -reviewed comfort measures for third trimester discomforts -HO ways to encourage labor given  -the FOB and her older daughter will be her labor support -CBC ordered, script for Iron  sent, reviewed Iron  rich foods.  -TWG 33 -36wk labs collected, cephalic by US  last week, cephalic by [facility] today.  -Warning signs reviewed      Orders Placed This Encounter  Procedures   Strep Gp B NAA   CBC   Return in about 1 week (around 01/20/2024) for ROB, NST.   Future Appointments  Date Time Provider Department Center  01/20/2024  8:15 AM AOB-NST ROOM [facility] None  01/20/2024  8:55 AM Free, Verita Glassman, CNM [facility] None  01/27/2024  8:45 AM AOB-NST ROOM [facility] None  01/27/2024  8:55 AM Alise Appl, CNM [facility] None   02/03/2024  8:15 AM AOB-NST ROOM [facility] None  02/03/2024  8:55 AM Brennen Gardiner, Alva Jewels, CNM [facility] None    For next visit:  ROB with NST     Chelsee Hosie M Keyri Salberg, CNM  06/06/259:42 AM

## 2024-01-13 NOTE — Assessment & Plan Note (Addendum)
-  reviewed comfort measures for third trimester discomforts -HO ways to encourage labor given  -the FOB and her older daughter will be her labor support -CBC ordered, script for Iron  sent, reviewed Iron  rich foods.  -TWG 33 -36wk labs collected, cephalic by US  last week, cephalic by [facility] today.  -Warning signs reviewed

## 2024-01-13 NOTE — Progress Notes (Signed)
    NURSE VISIT NOTE  Subjective:    Patient ID: MINHA FULCO, female    DOB: 05/27/1981, 43 y.o.   MRN: [id number]  HPI  Patient is a 43 y.o. G75P1102 female who presents for fetal monitoring per order from Fred Jacobsen, [place].   Objective:    LMP 05/07/2023 (Exact Date) Comment: shorter and earlier than usual, prior to that was 04/11/23 Estimated Date of Delivery: 02/11/24  [redacted]w[redacted]d  Fetus A Non-Stress Test Interpretation for 01/13/24  Indication: Advanced Maternal Age >40 years   Assessment:   1. Multigravida of advanced maternal age in third trimester      Plan:   Results reviewed and discussed with patient by  Anice Kerbs, CNM.     Doneen Fuelling, CMA [facility]

## 2024-01-13 NOTE — Assessment & Plan Note (Signed)
-  RNST -Desires IOL at 39wks -US  5/27 3122 gram, 98%, AFI 14, cephalic

## 2024-01-14 LAB — CBC
Hematocrit: 31.9 % — ABNORMAL LOW (ref 34.0–46.6)
Hemoglobin: 10.6 g/dL — ABNORMAL LOW (ref 11.1–15.9)
MCH: 29.2 pg (ref 26.6–33.0)
MCHC: 33.2 g/dL (ref 31.5–35.7)
MCV: 88 fL (ref 79–97)
Platelets: 359 x10E3/uL (ref 150–450)
RBC: 3.63 x10E6/uL — ABNORMAL LOW (ref 3.77–5.28)
RDW: 12.8 % (ref 11.7–15.4)
WBC: 9.7 x10E3/uL (ref 3.4–10.8)

## 2024-01-15 ENCOUNTER — Ambulatory Visit: Payer: Self-pay | Admitting: Licensed Practical Nurse

## 2024-01-15 LAB — STREP GP B NAA: Strep Gp B NAA: NEGATIVE

## 2024-01-16 LAB — CERVICOVAGINAL ANCILLARY ONLY
Bacterial Vaginitis (gardnerella): NEGATIVE
Candida Glabrata: NEGATIVE
Candida Vaginitis: NEGATIVE
Chlamydia: NEGATIVE
Comment: NEGATIVE
Comment: NEGATIVE
Comment: NEGATIVE
Comment: NEGATIVE
Comment: NORMAL
Neisseria Gonorrhea: NEGATIVE

## 2024-01-17 NOTE — Telephone Encounter (Signed)
 Patient seen by OBGYN on 01/13/2024.

## 2024-01-20 ENCOUNTER — Ambulatory Visit (INDEPENDENT_AMBULATORY_CARE_PROVIDER_SITE_OTHER)

## 2024-01-20 ENCOUNTER — Other Ambulatory Visit

## 2024-01-20 VITALS — BP 98/65 | HR 70 | Wt 185.1 lb

## 2024-01-20 DIAGNOSIS — O09529 Supervision of elderly multigravida, unspecified trimester: Secondary | ICD-10-CM

## 2024-01-20 DIAGNOSIS — O09523 Supervision of elderly multigravida, third trimester: Secondary | ICD-10-CM | POA: Diagnosis not present

## 2024-01-20 DIAGNOSIS — Z3480 Encounter for supervision of other normal pregnancy, unspecified trimester: Secondary | ICD-10-CM

## 2024-01-20 DIAGNOSIS — Z3A36 36 weeks gestation of pregnancy: Secondary | ICD-10-CM | POA: Insufficient documentation

## 2024-01-20 NOTE — Assessment & Plan Note (Signed)
-   RNST today in clinic.  - Reviewed negative GBS result. - Provided with resources for encouraging timely labor.  - Reviewed labor warning signs and expectations for birth. Instructed to call office or come to hospital with persistent headache, vision changes, regular contractions, leaking of fluid, decreased fetal movement or vaginal bleeding.

## 2024-01-20 NOTE — Progress Notes (Signed)
    Return Prenatal Note   Assessment/Plan   Plan  43 y.o. [id number] at [redacted]w[redacted]d presents for follow-up OB visit. Reviewed prenatal record including previous visit note.  Supervision of other normal pregnancy, antepartum - RNST today in clinic.  - Reviewed negative GBS result. - Provided with resources for encouraging timely labor.  - Reviewed labor warning signs and expectations for birth. Instructed to call office or come to hospital with persistent headache, vision changes, regular contractions, leaking of fluid, decreased fetal movement or vaginal bleeding.   No orders of the defined types were placed in this encounter.  No follow-ups on file.   Future Appointments  Date Time Provider Department Center  01/27/2024  8:45 AM AOB-NST ROOM [facility] None  01/27/2024  8:55 AM Alise Appl, CNM [facility] None  02/03/2024  8:15 AM AOB-NST ROOM [facility] None  02/03/2024  8:55 AM Dominic, Alva Jewels, CNM [facility] None    For next visit:  continue with routine prenatal care     Subjective   43 y.o. [id number] at [redacted]w[redacted]d presents for this follow-up prenatal visit.  Patient feeling more uncomfortable, ready for baby. Has started oral EPO. Patient reports: Movement: Present Contractions: Irregular  Objective   Flow sheet Vitals: Pulse Rate: 70 BP: 98/65 Fundal Height: 37 cm Fetal Heart Rate (bpm): RNST Presentation: Vertex Total weight gain: 35 lb 1.6 oz (15.9 kg)  General Appearance  No acute distress, well appearing, and well nourished Pulmonary   Normal work of breathing Neurologic   Alert and oriented to person, place, and time Psychiatric   Mood and affect within normal limits  Verita Glassman Kymari Lollis, CNM  06/13/259:13 AM

## 2024-01-20 NOTE — Patient Instructions (Signed)
 Nonstress Test: What to Expect A nonstress test, also called an NST, is done during pregnancy to check your baby's heartbeat. The procedure can help to show if your baby is healthy. It may be done if: Your due date has passed. Your pregnancy is high risk. Your baby is moving less than normal. You've lost a previous pregnancy. Your baby is growing slowly. There's too much or too little fluid around your baby. The NST may be done in the third trimester to find out if it's best for your baby to be born early. During an NST, your baby's heartbeat is watched for at least 20 minutes. If the baby is healthy, the heart rate will go up when the baby moves and will return to normal when the baby rests. This should happen at least twice during the test. Tell a health care provider about: Any allergies you have. Any medical problems you have. All medicines you take. These include vitamins, herbs, eye drops, and creams. Any surgeries you've had. Any past pregnancies you've had. What are the risks? There are no risks to you or your baby from a nonstress test. This procedure shouldn't be painful or uncomfortable. What happens before? Eat a meal right before the test or as told by your health care team. Food may help the baby to move. Use the restroom right before the test. What happens during a nonstress test?  Two monitors will be placed on your belly. One will check your baby's heart rate, and the other will check for contractions. You may be asked to lie down on your side or to sit up. You may be given a button to press when you feel your baby move. If your baby seems to be sleeping, you may be asked to drink some juice or soda, eat a snack, or change positions. These steps may vary. Ask what you can expect. What can I expect after? Your team will talk with you about the results and tell you the next steps. If your team gave you any diet or activity instructions, make sure to follow them. Keep all  follow-up visits. This is important to check on your health and the health of your baby. This information is not intended to replace advice given to you by your health care provider. Make sure you discuss any questions you have with your health care provider. Document Revised: 07/21/2023 Document Reviewed: 07/21/2023 Elsevier Patient Education  2025 [employer].

## 2024-01-27 ENCOUNTER — Encounter: Payer: Self-pay | Admitting: Certified Nurse Midwife

## 2024-01-27 ENCOUNTER — Ambulatory Visit: Admitting: Certified Nurse Midwife

## 2024-01-27 ENCOUNTER — Other Ambulatory Visit

## 2024-01-27 VITALS — BP 93/61 | HR 97 | Wt 184.0 lb

## 2024-01-27 DIAGNOSIS — O09523 Supervision of elderly multigravida, third trimester: Secondary | ICD-10-CM

## 2024-01-27 DIAGNOSIS — Z3A37 37 weeks gestation of pregnancy: Secondary | ICD-10-CM

## 2024-01-27 NOTE — Patient Instructions (Addendum)
 Nonstress Test: What to Expect A nonstress test, also called an NST, is done during pregnancy to check your baby's heartbeat. The procedure can help to show if your baby is healthy. It may be done if: Your due date has passed. Your pregnancy is high risk. Your baby is moving less than normal. You've lost a previous pregnancy. Your baby is growing slowly. There's too much or too little fluid around your baby. The NST may be done in the third trimester to find out if it's best for your baby to be born early. During an NST, your baby's heartbeat is watched for at least 20 minutes. If the baby is healthy, the heart rate will go up when the baby moves and will return to normal when the baby rests. This should happen at least twice during the test. Tell a health care provider about: Any allergies you have. Any medical problems you have. All medicines you take. These include vitamins, herbs, eye drops, and creams. Any surgeries you've had. Any past pregnancies you've had. What are the risks? There are no risks to you or your baby from a nonstress test. This procedure shouldn't be painful or uncomfortable. What happens before? Eat a meal right before the test or as told by your health care team. Food may help the baby to move. Use the restroom right before the test. What happens during a nonstress test?  Two monitors will be placed on your belly. One will check your baby's heart rate, and the other will check for contractions. You may be asked to lie down on your side or to sit up. You may be given a button to press when you feel your baby move. If your baby seems to be sleeping, you may be asked to drink some juice or soda, eat a snack, or change positions. These steps may vary. Ask what you can expect. What can I expect after? Your team will talk with you about the results and tell you the next steps. If your team gave you any diet or activity instructions, make sure to follow them. Keep all  follow-up visits. This is important to check on your health and the health of your baby. This information is not intended to replace advice given to you by your health care provider. Make sure you discuss any questions you have with your health care provider. Document Revised: 07/21/2023 Document Reviewed: 07/21/2023 Elsevier Patient Education  2025 [employer]Braxton Lonni Robert Contractions: Self-Care Braxton Hicks contractions may feel like labor contractions, but they're like practice for real labor. They can start when you're halfway through your pregnancy. During the last 2 months of your pregnancy, these contractions may happen more and hurt more. However, they aren't a sign that you're in real labor. What are Glover Larve contractions? Braxton Hicks contractions make your belly muscles tighten. They aren't real labor because they don't make your cervix, which is the lowest part of your uterus, open and thin. This tightening of your belly before labor starts can also be called false labor. How to tell the difference between true labor and false labor True labor contractions Last 60-90 seconds. Happen on a pattern. Get stronger and last longer over time. Don't go away when you: Walk. Change positions. Rest. Discomfort often begins in the back and then moves to the front. The cervix opens and thins. False labor contractions Are weak and last a short time. Don't happen on a pattern. Happen farther apart than true labor. May go away when you:  Walk. Change positions. Rest. May be noticed more at the end of the day. Discomfort is often only felt in the front of the belly. The cervix usually does not open or thin. Sometimes, the only way to tell if you're in true labor is for your health care provider to check your cervix. Your provider will do a physical exam and may monitor your contractions. If you're in true labor, your provider may send you home with instructions about when to  return to the hospital or may send you to the hospital right away. Follow these instructions at home:  Take your medicines only as told. Drink more fluids as told. Dehydration may cause these contractions. Dehydration is when there's not enough water in your body. If Braxton Hicks contractions are making you uncomfortable: Change your position or activity. Sit and rest in a tub of warm water. Do slow and deep breathing several times an hour. Keep all follow-up visits. Your provider will need to check your health and your baby's health. Contact a health care provider if: Your contractions become: Stronger. More regular. Closer together. You have mucus from your vagina that has blood in it. Get help right away if: You feel your baby moving less than usual. You have any amount of fluid that flows from your vagina without stopping. You have signs or symptoms of labor before 37 weeks of pregnancy, such as: Contractions that are 5 minutes or less apart, or that get stronger and last longer. Sudden, sharp pain in your belly or lower back. These symptoms may be an emergency. Call 911 right away. Do not wait to see if the symptoms will go away. Do not drive yourself to the hospital. This information is not intended to replace advice given to you by your health care provider. Make sure you discuss any questions you have with your health care provider. Document Revised: 03/08/2023 Document Reviewed: 03/08/2023 Elsevier Patient Education  2025 [employer].

## 2024-01-27 NOTE — Progress Notes (Addendum)
 ROB and NST for AMA. She is doing well. Is asking about scheduling her induction today. State she needs to let her work know. Induction scheduled for 02/06/24 @0500  for AMA. Orders placed. She is feeling good movement and is have irregular contractions. Reviewed labor precautions . Follow up 1 wk for ROB and NST.   Alise Appl, CNM   NST reactive see RN note.

## 2024-01-30 ENCOUNTER — Encounter: Payer: Self-pay | Admitting: Family Medicine

## 2024-01-30 ENCOUNTER — Encounter: Payer: Self-pay | Admitting: Obstetrics and Gynecology

## 2024-01-30 ENCOUNTER — Other Ambulatory Visit: Payer: Self-pay

## 2024-01-30 ENCOUNTER — Ambulatory Visit (INDEPENDENT_AMBULATORY_CARE_PROVIDER_SITE_OTHER): Admitting: Family Medicine

## 2024-01-30 ENCOUNTER — Encounter: Payer: Self-pay | Admitting: Certified Nurse Midwife

## 2024-01-30 ENCOUNTER — Inpatient Hospital Stay
Admission: EM | Admit: 2024-01-30 | Discharge: 2024-02-01 | DRG: 807 | Disposition: A | Discharge status: Home / Self Care | Attending: Certified Nurse Midwife | Admitting: Certified Nurse Midwife

## 2024-01-30 VITALS — BP 115/57 | HR 108 | Ht 64.0 in | Wt 183.0 lb

## 2024-01-30 DIAGNOSIS — G629 Polyneuropathy, unspecified: Secondary | ICD-10-CM | POA: Diagnosis not present

## 2024-01-30 DIAGNOSIS — Z833 Family history of diabetes mellitus: Secondary | ICD-10-CM

## 2024-01-30 DIAGNOSIS — Z8249 Family history of ischemic heart disease and other diseases of the circulatory system: Secondary | ICD-10-CM | POA: Diagnosis not present

## 2024-01-30 DIAGNOSIS — O4292 Full-term premature rupture of membranes, unspecified as to length of time between rupture and onset of labor: Principal | ICD-10-CM | POA: Diagnosis present

## 2024-01-30 DIAGNOSIS — Z7982 Long term (current) use of aspirin: Secondary | ICD-10-CM

## 2024-01-30 DIAGNOSIS — R531 Weakness: Secondary | ICD-10-CM | POA: Diagnosis not present

## 2024-01-30 DIAGNOSIS — O09293 Supervision of pregnancy with other poor reproductive or obstetric history, third trimester: Secondary | ICD-10-CM | POA: Diagnosis not present

## 2024-01-30 DIAGNOSIS — Z30017 Encounter for initial prescription of implantable subdermal contraceptive: Secondary | ICD-10-CM | POA: Diagnosis not present

## 2024-01-30 DIAGNOSIS — O9902 Anemia complicating childbirth: Secondary | ICD-10-CM | POA: Diagnosis present

## 2024-01-30 DIAGNOSIS — Z3A38 38 weeks gestation of pregnancy: Secondary | ICD-10-CM

## 2024-01-30 DIAGNOSIS — O4202 Full-term premature rupture of membranes, onset of labor within 24 hours of rupture: Secondary | ICD-10-CM | POA: Diagnosis not present

## 2024-01-30 DIAGNOSIS — O26893 Other specified pregnancy related conditions, third trimester: Secondary | ICD-10-CM | POA: Diagnosis present

## 2024-01-30 DIAGNOSIS — O09523 Supervision of elderly multigravida, third trimester: Secondary | ICD-10-CM | POA: Diagnosis not present

## 2024-01-30 DIAGNOSIS — N898 Other specified noninflammatory disorders of vagina: Secondary | ICD-10-CM

## 2024-01-30 DIAGNOSIS — Z3A37 37 weeks gestation of pregnancy: Secondary | ICD-10-CM

## 2024-01-30 DIAGNOSIS — Z9882 Breast implant status: Secondary | ICD-10-CM

## 2024-01-30 DIAGNOSIS — R200 Anesthesia of skin: Secondary | ICD-10-CM | POA: Diagnosis not present

## 2024-01-30 DIAGNOSIS — Z3046 Encounter for surveillance of implantable subdermal contraceptive: Secondary | ICD-10-CM | POA: Diagnosis not present

## 2024-01-30 DIAGNOSIS — O99355 Diseases of the nervous system complicating the puerperium: Secondary | ICD-10-CM | POA: Diagnosis not present

## 2024-01-30 LAB — WET PREP FOR TRICH, YEAST, CLUE
Clue Cell Exam: NEGATIVE
Trichomonas Exam: NEGATIVE
Yeast Exam: NEGATIVE

## 2024-01-30 LAB — URINALYSIS, ROUTINE W REFLEX MICROSCOPIC
Bilirubin, UA: NEGATIVE
Glucose, UA: NEGATIVE
Ketones, UA: NEGATIVE
Leukocytes,UA: NEGATIVE
Nitrite, UA: NEGATIVE
RBC, UA: NEGATIVE
Specific Gravity, UA: 1.02 (ref 1.005–1.030)
Urobilinogen, Ur: 0.2 mg/dL (ref 0.2–1.0)
pH, UA: 6 (ref 5.0–7.5)

## 2024-01-30 LAB — CBC
HCT: 32.1 % — ABNORMAL LOW (ref 36.0–46.0)
Hemoglobin: 11 g/dL — ABNORMAL LOW (ref 12.0–15.0)
MCH: 28.4 pg (ref 26.0–34.0)
MCHC: 34.3 g/dL (ref 30.0–36.0)
MCV: 82.9 fL (ref 80.0–100.0)
Platelets: 361 10*3/uL (ref 150–400)
RBC: 3.87 MIL/uL (ref 3.87–5.11)
RDW: 13.7 % (ref 11.5–15.5)
WBC: 11.2 10*3/uL — ABNORMAL HIGH (ref 4.0–10.5)
nRBC: 0 % (ref 0.0–0.2)

## 2024-01-30 LAB — TYPE AND SCREEN
ABO/RH(D): AB POS
Antibody Screen: NEGATIVE

## 2024-01-30 MED ORDER — HYDROXYZINE HCL 25 MG PO TABS
25.0000 mg | ORAL_TABLET | Freq: Every evening | ORAL | Status: DC | PRN
  Administered 2024-01-30: 25 mg via ORAL
  Filled 2024-01-30: qty 1

## 2024-01-30 MED ORDER — MISOPROSTOL 50MCG HALF TABLET
50.0000 ug | ORAL_TABLET | ORAL | Status: DC
Start: 2024-01-30 — End: 2024-01-31
  Administered 2024-01-30: 50 ug via ORAL
  Filled 2024-01-30: qty 1

## 2024-01-30 MED ORDER — LACTATED RINGERS IV SOLN
INTRAVENOUS | Status: DC
  Administered 2024-01-31: 1000 mL via INTRAVENOUS

## 2024-01-30 MED ORDER — OXYTOCIN-SODIUM CHLORIDE 30-0.9 UT/500ML-% IV SOLN
2.5000 [IU]/h | INTRAVENOUS | Status: DC
  Administered 2024-01-31: 2.5 [IU]/h via INTRAVENOUS
  Filled 2024-01-30: qty 500

## 2024-01-30 MED ORDER — LACTATED RINGERS IV SOLN
500.0000 mL | INTRAVENOUS | Status: DC | PRN
  Administered 2024-01-31: 500 mL via INTRAVENOUS

## 2024-01-30 MED ORDER — ONDANSETRON HCL 4 MG/2ML IJ SOLN
4.0000 mg | Freq: Four times a day (QID) | INTRAMUSCULAR | Status: DC | PRN
  Administered 2024-01-31: 4 mg via INTRAVENOUS
  Filled 2024-01-30: qty 2

## 2024-01-30 MED ORDER — ACETAMINOPHEN 500 MG PO TABS: 1000.0000 mg | ORAL_TABLET | Freq: Four times a day (QID) | ORAL | Status: DC | PRN

## 2024-01-30 MED ORDER — TERBUTALINE SULFATE 1 MG/ML IJ SOLN
0.2500 mg | Freq: Once | INTRAMUSCULAR | Status: DC | PRN
Start: 2024-01-30 — End: 2024-01-31

## 2024-01-30 MED ORDER — OXYTOCIN BOLUS FROM INFUSION
333.0000 mL | Freq: Once | INTRAVENOUS | Status: AC
  Administered 2024-01-31: 333 mL via INTRAVENOUS

## 2024-01-30 MED ORDER — OXYTOCIN-SODIUM CHLORIDE 30-0.9 UT/500ML-% IV SOLN
1.0000 m[IU]/min | INTRAVENOUS | Status: DC
  Administered 2024-01-31: 2 m[IU]/min via INTRAVENOUS

## 2024-01-30 MED ORDER — ACETAMINOPHEN 325 MG PO TABS: 650.0000 mg | ORAL_TABLET | ORAL | Status: DC | PRN

## 2024-01-30 MED ORDER — MISOPROSTOL 50MCG HALF TABLET
50.0000 ug | ORAL_TABLET | Freq: Once | ORAL | Status: DC
  Filled 2024-01-30: qty 1

## 2024-01-30 MED ORDER — MISOPROSTOL 25 MCG QUARTER TABLET: 25.0000 ug | ORAL_TABLET | Freq: Once | ORAL | Status: DC

## 2024-01-30 MED ORDER — LIDOCAINE HCL (PF) 1 % IJ SOLN
30.0000 mL | INTRAMUSCULAR | Status: AC | PRN
  Administered 2024-01-31: 30 mL via SUBCUTANEOUS
  Filled 2024-01-30: qty 30

## 2024-01-30 MED ORDER — SOD CITRATE-CITRIC ACID 500-334 MG/5ML PO SOLN: 30.0000 mL | ORAL | Status: DC | PRN

## 2024-01-30 MED ORDER — FENTANYL CITRATE (PF) 100 MCG/2ML IJ SOLN: 50.0000 ug | INTRAMUSCULAR | Status: DC | PRN

## 2024-01-30 NOTE — Progress Notes (Signed)
 BP (!) 115/57 (BP Location: Left Arm, Patient Position: Sitting, Cuff Size: Normal)   Pulse (!) 108   Ht 5' 4 (1.626 m)   Wt 183 lb (83 kg)   LMP 05/07/2023 (Exact Date) Comment: shorter and earlier than usual, prior to that was 04/11/23  PF 96 L/min   BMI 31.41 kg/m    Subjective:    Patient ID: Theresa Roberts, female    DOB: 18-Dec-1980, 43 y.o.   MRN: [id number]  HPI: Theresa Roberts is a 43 y.o. female  Chief Complaint  Patient presents with   Vaginitis   VAGINAL DISCHARGE- currently [redacted] weeks pregnant. Due to be induced in 1 week.  Duration: weeks Discharge description: white  Pruritus: yes Dysuria: no Malodorous: no Urinary frequency: yes Fevers: no Abdominal pain: no  Sexual activity: monogamous History of sexually transmitted diseases: no Recent antibiotic use: no Context: stable  Treatments attempted: none  Relevant past medical, surgical, family and social history reviewed and updated as indicated. Interim medical history since our last visit reviewed. Allergies and medications reviewed and updated.  Review of Systems  Constitutional: Negative.   Respiratory: Negative.    Cardiovascular: Negative.   Genitourinary:  Positive for vaginal discharge. Negative for decreased urine volume, difficulty urinating, dyspareunia, dysuria, enuresis, flank pain, frequency, genital sores, hematuria, menstrual problem, pelvic pain, urgency, vaginal bleeding and vaginal pain.  Musculoskeletal: Negative.   Psychiatric/Behavioral: Negative.      Per HPI unless specifically indicated above     Objective:    BP (!) 115/57 (BP Location: Left Arm, Patient Position: Sitting, Cuff Size: Normal)   Pulse (!) 108   Ht 5' 4 (1.626 m)   Wt 183 lb (83 kg)   LMP 05/07/2023 (Exact Date) Comment: shorter and earlier than usual, prior to that was 04/11/23  PF 96 L/min   BMI 31.41 kg/m   Wt Readings from Last 3 Encounters:  01/30/24 183 lb (83 kg)  01/27/24 184 lb (83.5 kg)   01/20/24 185 lb 1.6 oz (84 kg)    Physical Exam Vitals and nursing note reviewed.  Constitutional:      General: She is not in acute distress.    Appearance: Normal appearance. She is not ill-appearing, toxic-appearing or diaphoretic.  HENT:     Head: Normocephalic and atraumatic.     Right Ear: External ear normal.     Left Ear: External ear normal.     Nose: Nose normal.     Mouth/Throat:     Mouth: Mucous membranes are moist.     Pharynx: Oropharynx is clear.   Eyes:     General: No scleral icterus.       Right eye: No discharge.        Left eye: No discharge.     Extraocular Movements: Extraocular movements intact.     Conjunctiva/sclera: Conjunctivae normal.     Pupils: Pupils are equal, round, and reactive to light.    Cardiovascular:     Rate and Rhythm: Normal rate and regular rhythm.     Pulses: Normal pulses.     Heart sounds: Normal heart sounds. No murmur heard.    No friction rub. No gallop.  Pulmonary:     Effort: Pulmonary effort is normal. No respiratory distress.     Breath sounds: Normal breath sounds. No stridor. No wheezing, rhonchi or rales.  Chest:     Chest wall: No tenderness.  Abdominal:     Comments: + gravid   Musculoskeletal:  General: Normal range of motion.     Cervical back: Normal range of motion and neck supple.   Skin:    General: Skin is warm and dry.     Capillary Refill: Capillary refill takes less than 2 seconds.     Coloration: Skin is not jaundiced or pale.     Findings: No bruising, erythema, lesion or rash.   Neurological:     General: No focal deficit present.     Mental Status: She is alert and oriented to person, place, and time. Mental status is at baseline.   Psychiatric:        Mood and Affect: Mood normal.        Behavior: Behavior normal.        Thought Content: Thought content normal.        Judgment: Judgment normal.     Results for orders placed or performed in visit on 01/13/24  Cervicovaginal  ancillary only   Collection Time: 01/13/24  8:24 AM  Result Value Ref Range   Neisseria Gonorrhea Negative    Chlamydia Negative    Bacterial Vaginitis (gardnerella) Negative    Candida Vaginitis Negative    Candida Glabrata Negative    Comment Normal Reference Range Candida Species - Negative    Comment Normal Reference Range Candida Galbrata - Negative    Comment      Normal Reference Range Bacterial Vaginosis - Negative   Comment Normal Reference Ranger Chlamydia - Negative    Comment      Normal Reference Range Neisseria Gonorrhea - Negative  Strep Gp B NAA   Collection Time: 01/13/24  9:07 AM   Specimen: Genital   VR  Result Value Ref Range   Strep Gp B NAA Negative Negative  CBC   Collection Time: 01/13/24  9:11 AM  Result Value Ref Range   WBC 9.7 3.4 - 10.8 x10E3/uL   RBC 3.63 (L) 3.77 - 5.28 x10E6/uL   Hemoglobin 10.6 (L) 11.1 - 15.9 g/dL   Hematocrit 68.0 (L) 65.9 - 46.6 %   MCV 88 79 - 97 fL   MCH 29.2 26.6 - 33.0 pg   MCHC 33.2 31.5 - 35.7 g/dL   RDW 87.1 88.2 - 84.5 %   Platelets 359 150 - 450 x10E3/uL      Assessment & Plan:   Problem List Items Addressed This Visit   None Visit Diagnoses       Vaginal discharge    -  Primary   Negative urine. Negative wet prep. Continue to monitor. Call with any concerns.   Relevant Orders   WET PREP FOR TRICH, YEAST, CLUE   Urinalysis, Routine w reflex microscopic     [redacted] weeks gestation of pregnancy            Follow up plan: Return if symptoms worsen or fail to improve.

## 2024-01-30 NOTE — Telephone Encounter (Signed)
 Copied from CRM [id number]. Topic: Clinical - Pink Word Triage >> Jan 10, 2024  2:47 PM Elle L wrote: Reason for Triage: The patient is pregnant and thinks she may have a yeast infection. She has irritation and states she was advised to schedule with the lab for a swab instead of coming in for an office visit previously. The patient's call back number is [phone]. >> Jan 30, 2024 10:23 AM Chiquita SQUIBB wrote: Patient is calling in again regarding the yeast infection swab, patient has not received a call back regarding this or have got the lab test ordered. Patient would like this done as soon as possible.  >> Jan 11, 2024 12:04 PM Selinda RAMAN wrote: The patient called back in wanting to schedule an appt for the lab to get a vaginal swab to check for a yeast infection. After speaking with [place] she said the message is with the clinical team and they should be calling her back today. I informed the patient she should be receiving a call today from the clinical team and she said thank you.

## 2024-01-30 NOTE — H&P (Signed)
 [facility] Labor & Delivery  History and Physical   HPI   Chief Complaint: gush of fluid & continued leaking  Theresa Roberts is a 43 y.o. [id number] at [redacted]w[redacted]d who presents for gush of fluid & continued leaking of clear fluid since 1835. She reports mild contractions that have been happening throughout the day. Endorses fetal movement, denies vaginal bleeding.  Pregnancy complicated by AMA>40, low lying placenta-resolved at 25w with placenta >3cm from os. Hx of polyhydramnios and LGA in prior pregnancy.  Early & regular prenatal care beginning at 11w.     Pregnancy Complications Patient Active Problem List   Diagnosis Date Noted   [redacted] weeks gestation of pregnancy 01/20/2024   Anemia in pregnancy 01/06/2024   AMA (advanced maternal age) multigravida 35+ 07/28/2023   Supervision of other normal pregnancy, antepartum 06/28/2023   GAD (generalized anxiety disorder) 11/30/2021   Chronic post-traumatic stress disorder (PTSD) 11/30/2021   Scoliosis 01/23/2016   Anterolisthesis 09/02/2015    Review of Systems A twelve point review of systems was negative except as stated in HPI.   HISTORY   Medications Medications Prior to Admission  Medication Sig Dispense Refill Last Dose/Taking   acetaminophen  (TYLENOL ) 650 MG CR tablet Take 650 mg by mouth every 8 (eight) hours as needed for pain.      aspirin  81 MG chewable tablet Chew 1 tablet (81 mg total) by mouth daily.      ferrous gluconate  (FERGON) 324 MG tablet Take 1 tablet (324 mg total) by mouth 2 (two) times daily with a meal. 60 tablet 3    Prenatal Vit-DSS-Fe Fum-FA (PRENATAL 19) 29-1 MG TABS Take 1 each by mouth daily with breakfast. 30 tablet 12     Allergies is allergic to wellbutrin  [bupropion ].   OB History OB History  Gravida Para Term Preterm AB Living  3 2 1 1  0 2  SAB IAB Ectopic Multiple Live Births  0 0 0 0 2    # Outcome Date GA Lbr Len/2nd Weight Sex Type Anes PTL Lv  3 Current            2 Preterm 10/29/08   3827 g M Vag-Spont   LIV     Complications: Retaining fluid  1 Term 07/27/01   3402 g F Vag-Spont   LIV    Past Medical History Past Medical History:  Diagnosis Date   Scoliosis     Past Surgical History Past Surgical History:  Procedure Laterality Date   CHOLECYSTECTOMY     PLACEMENT OF BREAST IMPLANTS      Social History  reports that she has never smoked. She has never used smokeless tobacco. She reports that she does not drink alcohol and does not use drugs.   Family History family history includes Asthma in her son; Breast cancer in her maternal grandmother and maternal great-grandmother; Diabetes in her maternal grandfather; Heart attack in her maternal grandfather.   PHYSICAL EXAM   Vitals:   01/30/24 2126 01/30/24 2127  BP:  112/73  Pulse:  (!) 119  Resp: 16   Temp: 98.2 F (36.8 C)   TempSrc: Oral   Weight: 83 kg   Height: 5' 4 (1.626 m)     Constitutional: No acute distress, well appearing, and well nourished. Neurologic: She is alert and conversational.  Psychiatric: She has a normal mood and affect.  Musculoskeletal: Normal gait, grossly normal range of motion Cardiovascular: Normal rate.   Pulmonary/Chest: Normal work of breathing.  Gastrointestinal/Abdominal:  Soft. Gravid. There is no tenderness.  Skin: Skin is warm and dry. No rash noted.  Genitourinary: Normal external female genitalia.  SVE:   Dilation: Fingertip Effacement (%): 50 Cervical Position: Posterior Station: -3 Presentation: Vertex   NST Interpretation Indication: SROM, contractions Baseline: 150 bpm Variability: moderate Accelerations: present Decelerations: absent Contractions: irregular, every 3-10 minutes Time noted:  See OBIX Impression: reactive Authenticated by: Harlene LITTIE Cisco    PRENATAL LABS FROM OB RESULTS CONSOLE  ABO, Rh: AB/Positive/-- (12/19 1403) Antibody: Negative (12/19 1403) Rubella: 3.30 (12/19 1403) RPR: Non Reactive  (04/17 0914)  HBsAg: Negative (12/19 1403)  HIV: Non Reactive (04/17 0914)  HAD:Wzhjupcz/-- (06/06 0907)     US  OB Comp + 14 Wk Result Date: 01/11/2024 EXAM: ULTRASOUND 2ND/3RD TRIMESTER TECHNIQUE: Transabdominal second/third trimester obstetric pelvic ultrasound was performed. COMPARISON: None provided. CLINICAL HISTORY: Size greater than dates. FINDINGS: FETUS: A single live intrauterine pregnancy is present, assigned gestational age [redacted] weeks 3 days, EDD 02/11/2024. POSITION: The fetus position is cephalic. HEART RATE: Fetal heart rate measures 144 bpm. PLACENTA: The placenta is posterior with no previa. AMNIOTIC FLUID: The amniotic fluid volume is within normal limits. Amniotic fluid index measures 14.0. FETAL ANATOMY: The upper lip, cerebral ventricles, thalami/CSP, choroid plexus, cerebellum and posterior fossa are normal. 4-chamber heart, LVOT and RVOT views are normal. The stomach, kidneys and bladder are normal. A 3-vessel cord is seen and is normal. The spine and extremities are normal in appearance. Female external genitalia. Nuchal region and abdominal cord insertion site not well visualized due to advanced gestational age. BIOMETRICS: BPD measures 8.2 cm, 33 weeks 1 day. Head circumference measures 31.0 cm, 34 weeks 5 days. Abdominal circumference measures 34.8 cm, 38 weeks 5 days. Femur length measures 7.2 cm, 36 weeks 6 days. Average Ultrasound Age 86 weeks, 4 days EDD: 02/03/24 Estimated fetal weight is 3122 g, at the 98th percentile based upon last menstrual period. Cervix length 4.4 cm with no evidence of internal cervical funneling. IMPRESSION: 1. Single living intrauterine gestation in cephalic lie with average ultrasound age 87 weeks 4 days, compared to expected gestational age [redacted] weeks 3 days by assigned dating. Estimated fetal weight 3122 g, at the 98th percentile. Fetal macrosomia not excluded. 2. Otherwise, no fetal or maternal abnormalities detected, with limitations as detailed.  Normal amniotic fluid volume. Electronically signed by: Selinda Blue MD 01/11/2024 08:43 AM EDT RP Workstation: [id number]     ASSESSMENT AND PLAN   Theresa Roberts is a 43 y.o. [id number] at [redacted]w[redacted]d with EDD: 02/11/2024, by Last Menstrual Period admitted for rupture of membranes.  Misoprostol for ripening. Epidural for pain management.  Fetal Status: - cephalic presentation by Leopolds, SVE, cephalic on u/s at 34w - EFW: 8lb2 by Leopolds  - CEFM - FHT currently cat I  AMA over 40 - NIPT low risk, XX  GBS negative - No prophylaxis indicated  Labs/Immunizations: TDAP: declined prenatally Flu: n/a Rubella: immune Varicella immune HIV: NR Hep B: negative Hep C: NR RPR: NR GBS: negative  Lab Results  Component Value Date   VZVIGG Reactive 07/28/2023   HIV Non Reactive 11/24/2023     Postpartum Plan: - Feeding: Formula - Contraception: plans Nexplanon  - Prenatal Care Provider: [facility]  Attending Dr. Janit was immediately available for the care of the patient.

## 2024-01-31 ENCOUNTER — Encounter: Payer: Self-pay | Admitting: Certified Nurse Midwife

## 2024-01-31 ENCOUNTER — Inpatient Hospital Stay: Admitting: Anesthesiology

## 2024-01-31 DIAGNOSIS — O4202 Full-term premature rupture of membranes, onset of labor within 24 hours of rupture: Secondary | ICD-10-CM | POA: Diagnosis not present

## 2024-01-31 DIAGNOSIS — O09293 Supervision of pregnancy with other poor reproductive or obstetric history, third trimester: Secondary | ICD-10-CM | POA: Diagnosis not present

## 2024-01-31 DIAGNOSIS — O09523 Supervision of elderly multigravida, third trimester: Secondary | ICD-10-CM | POA: Diagnosis not present

## 2024-01-31 LAB — RPR: RPR Ser Ql: NONREACTIVE

## 2024-01-31 LAB — ABO/RH: ABO/RH(D): AB POS

## 2024-01-31 MED ORDER — METHYLERGONOVINE MALEATE 0.2 MG/ML IJ SOLN: 0.2000 mg | INTRAMUSCULAR | Status: DC | PRN

## 2024-01-31 MED ORDER — CALCIUM CARBONATE ANTACID 500 MG PO CHEW: 2.0000 | CHEWABLE_TABLET | ORAL | Status: DC | PRN

## 2024-01-31 MED ORDER — EPHEDRINE 5 MG/ML INJ
10.0000 mg | INTRAVENOUS | Status: DC | PRN
  Administered 2024-01-31: 10 mg via INTRAVENOUS

## 2024-01-31 MED ORDER — SIMETHICONE 80 MG PO CHEW: 80.0000 mg | CHEWABLE_TABLET | ORAL | Status: DC | PRN

## 2024-01-31 MED ORDER — OXYTOCIN-SODIUM CHLORIDE 30-0.9 UT/500ML-% IV SOLN: 2.5000 [IU]/h | INTRAVENOUS | Status: DC | PRN

## 2024-01-31 MED ORDER — DOCUSATE SODIUM 100 MG PO CAPS
100.0000 mg | ORAL_CAPSULE | Freq: Two times a day (BID) | ORAL | Status: DC
Start: 2024-01-31 — End: 2024-02-01
  Administered 2024-02-01: 100 mg via ORAL
  Filled 2024-01-31: qty 1

## 2024-01-31 MED ORDER — COCONUT OIL OIL: 1.0000 | TOPICAL_OIL | Status: DC | PRN

## 2024-01-31 MED ORDER — LACTATED RINGERS IV SOLN
500.0000 mL | Freq: Once | INTRAVENOUS | Status: AC
  Administered 2024-01-31: 500 mL via INTRAVENOUS

## 2024-01-31 MED ORDER — DIPHENHYDRAMINE HCL 25 MG PO CAPS: 25.0000 mg | ORAL_CAPSULE | Freq: Four times a day (QID) | ORAL | Status: DC | PRN

## 2024-01-31 MED ORDER — MISOPROSTOL 200 MCG PO TABS
ORAL_TABLET | ORAL | Status: AC
  Filled 2024-01-31: qty 4

## 2024-01-31 MED ORDER — EPHEDRINE 5 MG/ML INJ
10.0000 mg | INTRAVENOUS | Status: DC | PRN
Start: 2024-01-31 — End: 2024-01-31
  Administered 2024-01-31: 10 mg via INTRAVENOUS
  Filled 2024-01-31: qty 5

## 2024-01-31 MED ORDER — OXYCODONE-ACETAMINOPHEN 5-325 MG PO TABS: 1.0000 | ORAL_TABLET | ORAL | Status: DC | PRN

## 2024-01-31 MED ORDER — PHENYLEPHRINE 80 MCG/ML (10ML) SYRINGE FOR IV PUSH (FOR BLOOD PRESSURE SUPPORT): 80.0000 ug | PREFILLED_SYRINGE | INTRAVENOUS | Status: DC | PRN

## 2024-01-31 MED ORDER — LIDOCAINE HCL (PF) 1 % IJ SOLN
INTRAMUSCULAR | Status: DC | PRN
  Administered 2024-01-31 (×2): 3 mL

## 2024-01-31 MED ORDER — ETONOGESTREL 68 MG ~~LOC~~ IMPL
68.0000 mg | DRUG_IMPLANT | Freq: Once | SUBCUTANEOUS | Status: AC
  Administered 2024-02-01: 68 mg via SUBCUTANEOUS
  Filled 2024-01-31 (×2): qty 1

## 2024-01-31 MED ORDER — FENTANYL-BUPIVACAINE-NACL 0.5-0.125-0.9 MG/250ML-% EP SOLN
EPIDURAL | Status: DC | PRN
  Administered 2024-01-31: 12 mL/h via EPIDURAL

## 2024-01-31 MED ORDER — IBUPROFEN 600 MG PO TABS
600.0000 mg | ORAL_TABLET | Freq: Four times a day (QID) | ORAL | Status: DC
  Administered 2024-01-31 – 2024-02-01 (×3): 600 mg via ORAL
  Filled 2024-01-31 (×3): qty 1

## 2024-01-31 MED ORDER — BENZOCAINE-MENTHOL 20-0.5 % EX AERO
1.0000 | INHALATION_SPRAY | CUTANEOUS | Status: DC | PRN
Start: 2024-01-31 — End: 2024-02-01
  Filled 2024-01-31: qty 56

## 2024-01-31 MED ORDER — FENTANYL-BUPIVACAINE-NACL 0.5-0.125-0.9 MG/250ML-% EP SOLN: 12.0000 mL/h | EPIDURAL | Status: DC | PRN

## 2024-01-31 MED ORDER — LIDOCAINE-EPINEPHRINE (PF) 1.5 %-1:200000 IJ SOLN
INTRAMUSCULAR | Status: DC | PRN
  Administered 2024-01-31 (×2): 3 mL via PERINEURAL

## 2024-01-31 MED ORDER — WITCH HAZEL-GLYCERIN EX PADS: MEDICATED_PAD | CUTANEOUS | Status: DC | PRN

## 2024-01-31 MED ORDER — BUPIVACAINE HCL (PF) 0.25 % IJ SOLN
INTRAMUSCULAR | Status: DC | PRN
  Administered 2024-01-31 (×4): 5 mL via EPIDURAL

## 2024-01-31 MED ORDER — PRENATAL MULTIVITAMIN CH
1.0000 | ORAL_TABLET | Freq: Every day | ORAL | Status: DC
  Administered 2024-02-01: 1 via ORAL
  Filled 2024-01-31: qty 1

## 2024-01-31 MED ORDER — ZOLPIDEM TARTRATE 5 MG PO TABS: 5.0000 mg | ORAL_TABLET | Freq: Every evening | ORAL | Status: DC | PRN

## 2024-01-31 MED ORDER — DIPHENHYDRAMINE HCL 50 MG/ML IJ SOLN: 12.5000 mg | INTRAMUSCULAR | Status: DC | PRN

## 2024-01-31 MED ORDER — ACETAMINOPHEN 325 MG PO TABS
650.0000 mg | ORAL_TABLET | ORAL | Status: DC | PRN
  Administered 2024-02-01: 650 mg via ORAL
  Filled 2024-01-31: qty 2

## 2024-01-31 MED ORDER — METHYLERGONOVINE MALEATE 0.2 MG PO TABS: 0.2000 mg | ORAL_TABLET | ORAL | Status: DC | PRN

## 2024-01-31 MED ORDER — TETANUS-DIPHTH-ACELL PERTUSSIS 5-2.5-18.5 LF-MCG/0.5 IM SUSY: 0.5000 mL | PREFILLED_SYRINGE | Freq: Once | INTRAMUSCULAR | Status: DC

## 2024-01-31 NOTE — Anesthesia Procedure Notes (Addendum)
 Epidural Patient location during procedure: OB Start time: 01/31/2024 6:16 AM End time: 01/31/2024 6:19 AM  Staffing Anesthesiologist: Chesley Lendia CROME, MD Performed: anesthesiologist   Preanesthetic Checklist Completed: patient identified, IV checked, site marked, risks and benefits discussed, surgical consent, monitors and equipment checked, pre-op evaluation and timeout performed  Epidural Patient position: sitting Prep: ChloraPrep Patient monitoring: heart rate, continuous pulse ox and blood pressure Approach: midline Location: L3-L4 Injection technique: LOR saline  Needle:  Needle type: Tuohy  Needle gauge: 17 G Needle length: 9 cm and 9 Needle insertion depth: 8.5 cm Catheter type: closed end flexible Catheter size: 19 Gauge Catheter at skin depth: 14 cm Test dose: negative and 1.5% lidocaine  with Epi 1:200 K  Assessment Sensory level: T10 Events: blood not aspirated, injection not painful, no injection resistance, no paresthesia and negative IV test  Additional Notes 2 attempt Pt. Evaluated and documentation done after procedure finished. Patient identified. Risks/Benefits/Options discussed with patient including but not limited to bleeding, infection, nerve damage, paralysis, failed block, incomplete pain control, headache, blood pressure changes, nausea, vomiting, reactions to medication both or allergic, itching and postpartum back pain. Confirmed with bedside nurse the patient's most recent platelet count. Confirmed with patient that they are not currently taking any anticoagulation, have any bleeding history or any family history of bleeding disorders. Patient expressed understanding and wished to proceed. All questions were answered. Sterile technique was used throughout the entire procedure. Please see nursing notes for vital signs. Test dose was given through epidural catheter and negative prior to continuing to dose epidural or start infusion. Warning signs of high  block given to the patient including shortness of breath, tingling/numbness in hands, complete motor block, or any concerning symptoms with instructions to call for help. Patient was given instructions on fall risk and not to get out of bed. All questions and concerns addressed with instructions to call with any issues or inadequate analgesia.    Patient tolerated the insertion well without immediate complications. Reason for block:procedure for pain

## 2024-01-31 NOTE — Progress Notes (Signed)
 Labor Progress Note   ASSESSMENT/PLAN   CATHELINE HIXON 43 y.o.   [id number]  at [redacted]w[redacted]d here for PROM.  FWB:  - Fetal well being assessed: Category 2        GBS: - GBS negative  LABOR: -  Active labor, ready to push - Will start pushing now - Pain Management: Epidural - Anticipate SVD   Labor Progress Cervical Exam  Last 3 exams Dil Eff Sta Exam Time  10 -- Plus 1 0934  9 -- 0 0750  8 90 0 0640    Principal Problem:   Labor and delivery, indication for care   SUBJECTIVE/OBJECTIVE   SUBJECTIVE:    Shlonda is feeling pressure with contractions.  OBJECTIVE: Vital Signs: Patient Vitals for the past 12 hrs:  BP Temp Temp src Pulse Resp SpO2  01/31/24 0705 -- -- -- -- -- 97 %  01/31/24 0704 (!) 98/57 -- -- (!) 101 -- --  01/31/24 0700 -- -- -- -- 16 97 %  01/31/24 0659 (!) 97/53 -- -- (!) 102 -- --  01/31/24 0655 -- -- -- -- -- 97 %  01/31/24 0654 (!) 100/51 -- -- 98 -- --  01/31/24 0650 -- -- -- -- -- 98 %  01/31/24 0649 (!) 96/49 -- -- (!) 113 -- 98 %  01/31/24 0645 -- -- -- -- -- 99 %  01/31/24 0644 (!) 94/52 -- -- (!) 109 -- --  01/31/24 0641 (!) 87/53 -- -- (!) 106 -- --  01/31/24 0640 -- -- -- -- -- 96 %  01/31/24 0639 (!) 80/45 -- -- 100 -- --  01/31/24 0638 (!) 86/43 -- -- (!) 103 -- --  01/31/24 0630 -- -- -- -- -- 99 %  01/31/24 0629 118/62 -- -- (!) 119 -- --  01/31/24 0623 112/69 -- -- 97 -- --  01/31/24 0619 115/63 -- -- 95 -- 100 %  01/31/24 0605 -- -- -- -- -- 99 %  01/31/24 0604 131/75 -- -- 100 -- --  01/31/24 0600 -- -- -- -- -- 99 %  01/31/24 0559 117/87 -- -- 99 -- --  01/31/24 0555 -- -- -- -- -- 100 %  01/31/24 0554 123/78 -- -- 99 -- --  01/31/24 0545 -- -- -- -- -- 100 %  01/31/24 0544 93/74 -- -- (!) 108 -- --  01/31/24 0540 -- -- -- -- -- 100 %  01/31/24 0539 97/72 -- -- (!) 120 -- --  01/31/24 0401 (!) 111/57 -- -- 91 -- --  01/30/24 2300 111/63 97.8 F (36.6 C) Oral 88 14 --    Last SVE:  Dilation: 10 Dilation Complete  Date: 01/31/24 Dilation Complete Time: 0935 Effacement (%): 90 Cervical Position: Middle Station: Plus 1 Presentation: Vertex Exam by:: m Justino CNM -  , Rupture Date: 01/30/24, Rupture Time: 1835,    FHR:   - Baseline: 130 - Variability: min/mod - Accels: present - Decels: occasional late Category 2  UTERINE ACTIVITY:  Contractions: q   Eleanor CHRISTELLA Justino, CNM

## 2024-01-31 NOTE — Anesthesia Procedure Notes (Deleted)
 Epidural Patient location during procedure: OB Start time: 01/31/2024 5:19 AM End time: 01/31/2024 5:25 AM  Staffing Anesthesiologist: Chesley Lendia CROME, MD Performed: anesthesiologist   Preanesthetic Checklist Completed: patient identified, IV checked, site marked, risks and benefits discussed, surgical consent, monitors and equipment checked, pre-op evaluation and timeout performed  Epidural Patient position: sitting Prep: ChloraPrep Patient monitoring: heart rate, continuous pulse ox and blood pressure Approach: midline Location: L3-L4 Injection technique: LOR saline  Needle:  Needle type: Tuohy  Needle gauge: 17 G Needle length: 9 cm and 9 Needle insertion depth: 8 cm Catheter type: closed end flexible Catheter size: 19 Gauge Catheter at skin depth: 13 cm Test dose: negative and 1.5% lidocaine  with Epi 1:200 K  Assessment Sensory level: T10 Events: blood not aspirated, injection not painful, no injection resistance, no paresthesia and negative IV test  Additional Notes 2 attempt Pt. Evaluated and documentation done after procedure finished. Patient identified. Risks/Benefits/Options discussed with patient including but not limited to bleeding, infection, nerve damage, paralysis, failed block, incomplete pain control, headache, blood pressure changes, nausea, vomiting, reactions to medication both or allergic, itching and postpartum back pain. Confirmed with bedside nurse the patient's most recent platelet count. Confirmed with patient that they are not currently taking any anticoagulation, have any bleeding history or any family history of bleeding disorders. Patient expressed understanding and wished to proceed. All questions were answered. Sterile technique was used throughout the entire procedure. Please see nursing notes for vital signs. Test dose was given through epidural catheter and negative prior to continuing to dose epidural or start infusion. Warning signs of high  block given to the patient including shortness of breath, tingling/numbness in hands, complete motor block, or any concerning symptoms with instructions to call for help. Patient was given instructions on fall risk and not to get out of bed. All questions and concerns addressed with instructions to call with any issues or inadequate analgesia.    Patient tolerated the insertion well without immediate complications. Reason for block:procedure for pain

## 2024-01-31 NOTE — Progress Notes (Signed)
 Labor Progress Note   ASSESSMENT/PLAN   Theresa Roberts 43 y.o.   [id number]  at [redacted]w[redacted]d here with labor. SROM clear at 1835, received one dose misoprostol & now progressing without augmentation. Episode of hypotension after 2nd epidural, ephedrine administered.  FWB:  - Fetal well being assessed:  Category II GBS: - GBS negative  LABOR: - Now in active labor, doing well. - Pain Management: epidural in place, replaced due to inadequate relief - Discussed options with patient and continue expectant management - Anticipate SVD   Principal Problem:   Labor and delivery, indication for care  Labor progress: 2143: FT/50/-3 0445: 4.5/80/-2 0600: 6/90/-1 9359: 8/90/0 SUBJECTIVE/OBJECTIVE   SUBJECTIVE: Feeling right sided contraction pain & rectal pressure but improved since second epidural placed.    OBJECTIVE: Vital Signs: Patient Vitals for the past 12 hrs:  BP Temp Temp src Pulse Resp SpO2 Height Weight  01/31/24 0705 -- -- -- -- -- 97 % -- --  01/31/24 0704 (!) 98/57 -- -- (!) 101 -- -- -- --  01/31/24 0700 -- -- -- -- 16 97 % -- --  01/31/24 0659 (!) 97/53 -- -- (!) 102 -- -- -- --  01/31/24 0655 -- -- -- -- -- 97 % -- --  01/31/24 0654 (!) 100/51 -- -- 98 -- -- -- --  01/31/24 0650 -- -- -- -- -- 98 % -- --  01/31/24 0649 (!) 96/49 -- -- (!) 113 -- 98 % -- --  01/31/24 0645 -- -- -- -- -- 99 % -- --  01/31/24 0644 (!) 94/52 -- -- (!) 109 -- -- -- --  01/31/24 0641 (!) 87/53 -- -- (!) 106 -- -- -- --  01/31/24 0640 -- -- -- -- -- 96 % -- --  01/31/24 0639 (!) 80/45 -- -- 100 -- -- -- --  01/31/24 0638 (!) 86/43 -- -- (!) 103 -- -- -- --  01/31/24 0630 -- -- -- -- -- 99 % -- --  01/31/24 0629 118/62 -- -- (!) 119 -- -- -- --  01/31/24 0623 112/69 -- -- 97 -- -- -- --  01/31/24 0619 115/63 -- -- 95 -- 100 % -- --  01/31/24 0605 -- -- -- -- -- 99 % -- --  01/31/24 0604 131/75 -- -- 100 -- -- -- --  01/31/24 0600 -- -- -- -- -- 99 % -- --  01/31/24 0559 117/87 -- -- 99 --  -- -- --  01/31/24 0555 -- -- -- -- -- 100 % -- --  01/31/24 0554 123/78 -- -- 99 -- -- -- --  01/31/24 0545 -- -- -- -- -- 100 % -- --  01/31/24 0544 93/74 -- -- (!) 108 -- -- -- --  01/31/24 0540 -- -- -- -- -- 100 % -- --  01/31/24 0539 97/72 -- -- (!) 120 -- -- -- --  01/31/24 0401 (!) 111/57 -- -- 91 -- -- -- --  01/30/24 2300 111/63 97.8 F (36.6 C) Oral 88 14 -- -- --  01/30/24 2127 112/73 -- -- (!) 119 -- -- -- --  01/30/24 2126 -- 98.2 F (36.8 C) Oral -- 16 -- 5' 4 (1.626 m) 83 kg    Last SVE:  Dilation: 8 Effacement (%): 90 Cervical Position: Middle Station: 0 Presentation: Vertex Exam by:: JDaley -  , Rupture Date: 01/30/24, Rupture Time: 1835,    FHR:   - Mode: External  - Baseline Rate (A): 136 bpm  -  moderate  variability  - Characteristics (ie - accels, decels): Accelerations: 15 x 15, variable, late decels present  -    UTERINE ACTIVITY:   - Mode: Toco  - Contraction Frequency (min): 2-4 minutes

## 2024-01-31 NOTE — Discharge Summary (Signed)
 Postpartum Discharge Summary  Date of Service updated***     Patient Name: Theresa Roberts DOB: [birth year] MRN: [id number]  Date of admission: 01/30/2024 Delivery date:01/31/2024 Delivering provider: JUSTINO ELEANOR HERO Date of discharge: 01/31/2024  Admitting diagnosis: Labor and delivery, indication for care [O75.9] Intrauterine pregnancy: [redacted]w[redacted]d     Secondary diagnosis:  Principal Problem:   Labor and delivery, indication for care  Additional problems: N/A    Discharge diagnosis: Term Pregnancy Delivered                                              Post partum procedures:{Postpartum procedures:23558} Augmentation: Pitocin and Cytotec Complications: None  Hospital course: Induction of Labor With Vaginal Delivery      43 y.o. yo [id number] at [redacted]w[redacted]d was admitted in Latent Labor on 01/30/2024. Labor course was uncomplicated. Membrane Rupture Time/Date: 6:35 PM,01/30/2024  Delivery Method:Vaginal, Spontaneous Operative Delivery:N/A Episiotomy:   Lacerations:  2nd degree Patient had a postpartum course complicated by ***.  She is ambulating, tolerating a regular diet, passing flatus, and urinating well. Patient is discharged home in stable condition on 01/31/24.  Newborn Data: Birth date:01/31/2024 Birth time:12:30 PM Gender:Female Living status:Living Apgars:6 ,8  Weight:4480 g  Magnesium Sulfate received: {Mag received:30440022} BMZ received: {BMZ received:30440023} Rhophylac:{Rhophylac received:30440032} MMR:N/A T-DaP:{Tdap:23962} Flu: N/A RSV Vaccine received: No Transfusion:{Transfusion received:30440034} Immunizations administered: Immunization History  Administered Date(s) Administered   Td 05/31/2006, 08/13/2016    Physical exam  Vitals:   01/31/24 0700 01/31/24 0704 01/31/24 0705 01/31/24 1303  BP:  (!) 98/57  (!) 110/54  Pulse:  (!) 101  (!) 151  Resp: 16     Temp:    99.1 F (37.3 C)  TempSrc:    Oral  SpO2: 97%  97%   Weight:      Height:        General: {Exam; general:21111117} Lochia: {Desc; appropriate/inappropriate:30686::appropriate} Uterine Fundus: {Desc; firm/soft:30687} Incision: {Exam; incision:21111123} DVT Evaluation: {Exam; dvt:2111122} Labs: Lab Results  Component Value Date   WBC 11.2 (H) 01/30/2024   HGB 11.0 (L) 01/30/2024   HCT 32.1 (L) 01/30/2024   MCV 82.9 01/30/2024   PLT 361 01/30/2024      Latest Ref Rng & Units 04/26/2023    4:48 PM  CMP  Glucose 70 - 99 mg/dL 86   BUN 6 - 24 mg/dL 8   Creatinine 9.42 - 8.99 mg/dL 9.25   Sodium 865 - 855 mmol/L 138   Potassium 3.5 - 5.2 mmol/L 4.1   Chloride 96 - 106 mmol/L 99   CO2 20 - 29 mmol/L 24   Calcium 8.7 - 10.2 mg/dL 9.2   Total Protein 6.0 - 8.5 g/dL 6.6   Total Bilirubin 0.0 - 1.2 mg/dL 0.3   Alkaline Phos 44 - 121 IU/L 61   AST 0 - 40 IU/L 23   ALT 0 - 32 IU/L 18    Edinburgh Score:    01/20/2024    8:52 AM  Edinburgh Postnatal Depression Scale Screening Tool  I have been able to laugh and see the funny side of things. 0  I have looked forward with enjoyment to things. 0  I have blamed myself unnecessarily when things went wrong. 1  I have been anxious or worried for no good reason. 2  I have felt scared or panicky for no good reason. 1  Things have been getting on top of me. 1  I have been so unhappy that I have had difficulty sleeping. 0  I have felt sad or miserable. 0  I have been so unhappy that I have been crying. 0  The thought of harming myself has occurred to me. 0  Edinburgh Postnatal Depression Scale Total 5      After visit meds:  Allergies as of 01/31/2024       Reactions   Wellbutrin  [bupropion ] Other (See Comments)   irritability     Med Rec must be completed prior to using this [facility]***        Discharge home in stable condition Infant Feeding: {Baby feeding:23562} Infant Disposition:{CHL IP OB HOME WITH FNUYZM:76418} Discharge instruction: per After Visit Summary and Postpartum  booklet. Activity: Advance as tolerated. Pelvic rest for 6 weeks.  Diet: {OB diet:21111121} Anticipated Birth Control: {Birth Control:23956} Postpartum Appointment:{Outpatient follow up:23559} Additional Postpartum F/U: {PP Procedure:23957} Future Appointments: Future Appointments  Date Time Provider Department Center  02/03/2024  8:15 AM [facility] ROOM [facility] None  02/03/2024  8:55 AM Candies Palm, Jinnie Jansky, CNM [facility] None   Follow up Visit:      01/31/2024 Eleanor CHRISTELLA Canny, CNM

## 2024-01-31 NOTE — Anesthesia Preprocedure Evaluation (Signed)
 Anesthesia Evaluation  Patient identified by MRN, date of birth, ID band Patient awake    Reviewed: Allergy & Precautions, NPO status , Patient's Chart, lab work & pertinent test results  History of Anesthesia Complications Negative for: history of anesthetic complications  Airway Mallampati: I  TM Distance: >3 FB Neck ROM: full    Dental no notable dental hx.    Pulmonary neg pulmonary ROS   Pulmonary exam normal        Cardiovascular Exercise Tolerance: Good negative cardio ROS Normal cardiovascular exam     Neuro/Psych  PSYCHIATRIC DISORDERS Anxiety        GI/Hepatic negative GI ROS,,,  Endo/Other    Renal/GU   negative genitourinary   Musculoskeletal   Abdominal   Peds  Hematology  (+) Blood dyscrasia, anemia   Anesthesia Other Findings Past Medical History: No date: Scoliosis  Past Surgical History: No date: CHOLECYSTECTOMY No date: PLACEMENT OF BREAST IMPLANTS  BMI    Body Mass Index: 31.41 kg/m      Reproductive/Obstetrics (+) Pregnancy                             Anesthesia Physical Anesthesia Plan  ASA: 2  Anesthesia Plan: Epidural   Post-op Pain Management:    Induction:   PONV Risk Score and Plan:   Airway Management Planned: Natural Airway  Additional Equipment:   Intra-op Plan:   Post-operative Plan:   Informed Consent: I have reviewed the patients History and Physical, chart, labs and discussed the procedure including the risks, benefits and alternatives for the proposed anesthesia with the patient or authorized representative who has indicated his/her understanding and acceptance.     Dental Advisory Given  Plan Discussed with: Anesthesiologist  Anesthesia Plan Comments: (Patient reports no bleeding problems and no anticoagulant use.   Patient consented for risks of anesthesia including but not limited to:  - adverse reactions to  medications - risk of bleeding, infection and or nerve damage from epidural that could lead to paralysis - risk of headache or failed epidural - nerve damage due to positioning - that if epidural is used for C-section that there is a chance of epidural failure requiring spinal placement or conversion to GA - Damage to heart, brain, lungs, other parts of body or loss of life  Patient voiced understanding and assent.)       Anesthesia Quick Evaluation

## 2024-02-01 DIAGNOSIS — O99355 Diseases of the nervous system complicating the puerperium: Secondary | ICD-10-CM

## 2024-02-01 DIAGNOSIS — Z3046 Encounter for surveillance of implantable subdermal contraceptive: Secondary | ICD-10-CM

## 2024-02-01 DIAGNOSIS — G629 Polyneuropathy, unspecified: Secondary | ICD-10-CM

## 2024-02-01 MED ORDER — IBUPROFEN 600 MG PO TABS: 600.0000 mg | ORAL_TABLET | Freq: Four times a day (QID) | ORAL | Status: AC | PRN

## 2024-02-01 MED ORDER — LIDOCAINE-SODIUM BICARBONATE 1-8.4 % IJ SOSY: 0.2500 mL | PREFILLED_SYRINGE | Freq: Once | INTRAMUSCULAR | Status: DC

## 2024-02-01 MED ORDER — WITCH HAZEL-GLYCERIN EX PADS: MEDICATED_PAD | CUTANEOUS | Status: DC | PRN

## 2024-02-01 MED ORDER — LIDOCAINE HCL 1 % IJ SOLN
0.0000 mL | Freq: Once | INTRAMUSCULAR | Status: AC | PRN
  Administered 2024-02-01: 10 mL via INTRADERMAL
  Filled 2024-02-01: qty 10

## 2024-02-01 NOTE — Consult Note (Signed)
 NEUROLOGY CONSULT NOTE   Date of service: February 01, 2024 Patient Name: Theresa Roberts MRN:  [id number] DOB:  1981-01-23 Chief Complaint: Left leg weakness Requesting Provider: Jayne Harlene CROME, CNM  History of Present Illness  Theresa Roberts is a 43 y.o. female with a PMHx of scoliosis, who presented at [redacted]w[redacted]d pregnant [phone]) to the Obstetrics service on Monday with rupture of membranes. She gave birth vaginally on Tuesday afternoon with epidural block for pain control at L3-L4 achieved on the second attempt. Based on the patient's description, she was in the delivery position with BLE flexed, abducted and externally rotated at the hips for about 3 hours. Anesthesia was called to the bedside this morning after patient reported that she had decreased motor in the leg yesterday after delivery, such that she had to use her arm to lift and adduct her LLE onto her bed after sitting up on the side of the bed with her left knee feeling weak in extension as well as difficulty flexing at the hip. The weakness had resolved by today AM, and she was able to walk, but she had residual decreased sensation to the left leg, medially, extending from her groin all the way down to her ankle. She reported tingling dysesthesias to touch in that region as well. Given that she reported that she pushed in labor for over 2 hours Anesthesia's thought was that this is some type of neuropraxia from labor positioning and will most likely continue to resolve on its own. Neurology was called to further evaluate.   Currently, she still has some numbness in the same distribution as above, sparing the lateral aspect of her leg as well as her foot. Denies any saddle anesthesia or difficulty with bladder or bowel sensation. Has not been incontinent.     ROS  Comprehensive ROS performed and pertinent positives documented in HPI    Past History   Past Medical History:  Diagnosis Date   Scoliosis     Past Surgical  History:  Procedure Laterality Date   CHOLECYSTECTOMY     PLACEMENT OF BREAST IMPLANTS      Family History: Family History  Problem Relation Age of Onset   Breast cancer Maternal Grandmother        malignant, unknown age   Heart attack Maternal Grandfather    Diabetes Maternal Grandfather        type 2   Asthma Son    Breast cancer Maternal Great-grandmother        early years    Social History  reports that she has never smoked. She has never used smokeless tobacco. She reports that she does not drink alcohol and does not use drugs.  Allergies  Allergen Reactions   Wellbutrin  [Bupropion ] Other (See Comments)    irritability    Medications   Current Facility-Administered Medications:    acetaminophen  (TYLENOL ) tablet 650 mg, 650 mg, Oral, Q4H PRN, Swanson, Melissa M, CNM   benzocaine-Menthol (DERMOPLAST) 20-0.5 % topical spray 1 Application, 1 Application, Topical, PRN, Justino, Melissa M, CNM   calcium carbonate (TUMS - dosed in mg elemental calcium) chewable tablet 400 mg of elemental calcium, 2 tablet, Oral, PRN, Swanson, Melissa M, CNM   coconut oil, 1 Application, Topical, PRN, Justino, Melissa M, CNM   diphenhydrAMINE (BENADRYL) capsule 25 mg, 25 mg, Oral, Q6H PRN, Justino, Melissa M, CNM   docusate sodium (COLACE) capsule 100 mg, 100 mg, Oral, BID, Swanson, Melissa M, CNM   etonogestrel  (NEXPLANON ) implant 68 mg,  68 mg, Subdermal, Once, Swanson, Melissa M, CNM   ibuprofen  (ADVIL ) tablet 600 mg, 600 mg, Oral, Q6H, Swanson, Melissa M, CNM, 600 mg at 02/01/24 9393   methylergonovine (METHERGINE) tablet 0.2 mg, 0.2 mg, Oral, Q4H PRN **OR** methylergonovine (METHERGINE) injection 0.2 mg, 0.2 mg, Intramuscular, Q4H PRN, Justino, Melissa M, CNM   oxyCODONE -acetaminophen  (PERCOCET/ROXICET) 5-325 MG per tablet 1 tablet, 1 tablet, Oral, Q4H PRN, Justino, Melissa M, CNM   oxytocin (PITOCIN) IV infusion 30 units in NS 500 mL - Premix, 2.5 Units/hr, Intravenous, Continuous PRN,  Justino Setter M, CNM   prenatal multivitamin tablet 1 tablet, 1 tablet, Oral, Q1200, Swanson, Melissa M, CNM   simethicone (MYLICON) chewable tablet 80 mg, 80 mg, Oral, PRN, Justino, Melissa M, CNM   Tdap (BOOSTRIX) injection 0.5 mL, 0.5 mL, Intramuscular, Once, Swanson, Melissa M, CNM   witch hazel-glycerin (TUCKS) pad, , Topical, PRN, Justino, Melissa M, CNM   zolpidem (AMBIEN) tablet 5 mg, 5 mg, Oral, QHS PRN, Justino Setter HERO, CNM  Vitals   Vitals:   01/31/24 2130 01/31/24 2320 02/01/24 0425 02/01/24 0819  BP: 111/65 108/69 106/63 103/64  Pulse: 96 97 87 87  Resp: 17 18 17 18   Temp: 98.6 F (37 C) 98.6 F (37 C) 98.4 F (36.9 C) 97.8 F (36.6 C)  TempSrc: Oral Oral Oral Oral  SpO2: 99% 100% 98% 99%  Weight:      Height:        Body mass index is 31.41 kg/m.   Physical Exam   Constitutional: Appears well-developed and well-nourished.  Psych: Affect is euthymic.  Eyes: No scleral injection.  HENT: No OP obstruction.  Head: Normocephalic.  Respiratory: Effort normal, non-labored breathing.   Neurologic Examination   Mental Status: Awake and alert. Fully oriented. Thought content appropriate. Speech fluent without evidence of aphasia.  Able to follow all commands without difficulty. Cranial Nerves: II: Temporal visual fields intact with no extinction to DSS.  III,IV, VI: No ptosis. EOMI. No nystagmus. V: FT sensation equal bilaterally VII: Smile symmetric VIII: Hearing intact to voice IX,X: No hypophonia or hoarseness XI: Symmetric XII: Midline tongue extension Motor: RUE: 5/5 LUE: 5/5 RLE: 5/5 proximally and distally LLE: 5/5 hip flexion, hip extension, hip abduction, hip adduction, knee flexion, knee extension, ADF and APF as well as toe-curl. Tone is normal.  Sensory: FT intact to BUE and RLE.  LLE: FT sensation spared to plantar and dorsal aspects of foot. There are dysesthesias to FT along anterior and medial aspects of ankle and lower leg,  dysesthesias to fine touch along anterior and medial aspects of the knee, dysesthesias to fine touch along medial thigh from knee to the inguinal crease. All of the above also with decreased fine touch sensation, except for the ankle. No sensory loss along the lateral aspects of the leg.  Deep Tendon Reflexes: 2+ and symmetric bilateral biceps and brachioradialis. 2+ right patellar, 0 left patellar, 2+ bilateral achilles.  Plantars: Right: downgoingLeft: downgoing Cerebellar: No ataxia with FNF or H-S bilaterally Gait: Deferred  Labs/Imaging/Neurodiagnostic studies   CBC:  Recent Labs  Lab 02/09/24 2222  WBC 11.2*  HGB 11.0*  HCT 32.1*  MCV 82.9  PLT 361   Basic Metabolic Panel:  Lab Results  Component Value Date   NA 138 04/26/2023   K 4.1 04/26/2023   CO2 24 04/26/2023   GLUCOSE 86 04/26/2023   BUN 8 04/26/2023   CREATININE 0.74 04/26/2023   CALCIUM 9.2 04/26/2023   GFRNONAA 110 05/18/2019  GFRAA 127 05/18/2019   Lipid Panel:  Lab Results  Component Value Date   LDLCALC 73 05/31/2022   HgbA1c:  Lab Results  Component Value Date   HGBA1C 5.3 07/28/2023   Urine Drug Screen:     Component Value Date/Time   COCAINSCRNUR Negative 07/28/2023 1434      ASSESSMENT  Theresa Roberts is a 43 y.o. female with new onset LLE weakness and numbness after vaginal delivery during which she had an epidural block. - Neurological exam reveals findings that best localize to the femoral nerve. Her initial symptoms of weakness also localized to the femoral nerve. Lack of a left patellar reflex conforms to this localization. Strength is normal and symmetric bilaterally in her upper and lower extremities.  - Literature review and discussion of localization:  - The incidence of postpartum peripheral nerve injuries varies in the literature from 0.3 to 2% of all deliveries. In a study of over 6,000 parturients, the most common peripheral nerve injuries found postpartum were to the lateral  femoral cutaneous nerve and the femoral nerve. Less common nerves affected include common peroneal, lumbosacral plexus, sciatic, obturator, and radicular nerves  - Her symptoms and signs most closely match those of femoral nerve injury, which classically results in decreased sensation to the anterior thigh and medial calf, together with weak thigh flexion and knee extension. - Mechanism of postpartum femoral nerve injury is compression under the inguinal ligament secondary to prolonged hip flexion, abduction, and external rotation, along with possibly decreased perineural flow to the iliacus nerve. 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   RECOMMENDATIONS  - Continue to monitor for improvement - If symptoms do not fully resolve, she can be referred to Neurology outpatient for EMG/NCS.   ______________________________________________________________________    Bonney SHARK, Jamaya Sleeth, MD [facility]

## 2024-02-01 NOTE — Anesthesia Postprocedure Evaluation (Signed)
 Anesthesia Post Note  Patient: Theresa Roberts  Procedure(s) Performed: AN AD HOC LABOR EPIDURAL  Patient location during evaluation: Mother Baby Anesthesia Type: Epidural Level of consciousness: awake, awake and alert and oriented Pain management: pain level controlled Vital Signs Assessment: post-procedure vital signs reviewed and stable Respiratory status: spontaneous breathing Postop Assessment: no headache and no backache Anesthetic complications: no  No notable events documented.   Last Vitals:  Vitals:   01/31/24 2320 02/01/24 0425  BP: 108/69 106/63  Pulse: 97 87  Resp: 18 17  Temp: 37 C 36.9 C  SpO2: 100% 98%    Last Pain:  Vitals:   02/01/24 0606  TempSrc:   PainSc: 9                  [employer]

## 2024-02-01 NOTE — Consult Note (Signed)
 I was called to evaluate the patient after a report of numbness in her left leg post labor epidural. Epidural was placed at 06:19 on 6/24. 2 attempts. Was removed at 13:35 on 6/24. I personally evaluated the patient, she reports that she had decreased motor in the leg yesterday which has resolved and that she is able to now walk. She looks to be grossly motor intact in her lower extremities. That now her symptoms are just decreased sensation on the left leg, medially, that goes all the way down to her foot. She reports that she pushed in labor for over 2 hours so my thought right now is that this is some type of neuropraxia from labor positioning and will most likely continue to resolve on its own.  I am consulting with neurology to have them evaluate her as well. I instructed the patient and nursing staff to let us  know if she has any new symptoms or if her current symptoms do not continue to resolve.

## 2024-02-01 NOTE — Progress Notes (Signed)
 Patient ID: BLU MCGLAUN, female   DOB: Dec 30, 1980, 43 y.o.   MRN: [id number]     GYNECOLOGY PROCEDURE NOTE  Patient is a 43 y.o. [id number] presenting for Nexplanon  insertion as her desires means of contraception.  She provided informed consent, signed copy in the chart, time out was performed. Pregnancy test was not performed, with self reported LMP of Patient's last menstrual period was 05/07/2023 (exact date).  She understands that Nexplanon  is a progesterone only therapy, and that patients often patients have irregular and unpredictable vaginal bleeding or amenorrhea. She understands that other side effects are possible related to systemic progesterone, including but not limited to, headaches, breast tenderness, nausea, and irritability. While effective at preventing pregnancy long acting reversible contraceptives do not prevent transmission of sexually transmitted diseases and use of barrier methods for this purpose was discussed. The placement procedure for Nexplanon  was reviewed with the patient in detail including risks of nerve injury, infection, bleeding and injury to other muscles or tendons. She understands that the Nexplanon  implant is good for 3 years and needs to be removed at the end of that time.  She understands that Nexplanon  is an extremely effective option for contraception, with failure rate of <1%. This information is reviewed today and all questions were answered. Informed consent was obtained, both verbally and written.   The patient is healthy and has no contraindications to Implanon  use. Urine pregnancy test was performed today and was negative.  Procedure Appropriate time out taken.  Patient placed in dorsal supine with left arm above head, elbow flexed at 90 degrees, arm resting on examination table.  The bicipital grove was palpated and site 8-10cm proximal to the medial epicondyle was indentified . The insertion site was prepped with a two betadine swabs and then  injected with 1 cc of 1% lidocaine  without epinephrine.  Nexplanon  removed form sterile blister packaging,  Device confirmed in needle, before inserting full length of needle, tenting up the skin as the needle was advance.  The drug eluting rod was then deployed by pulling back the slider per the manufactures recommendation.  The implant was palpable by the clinician as well as the patient.  The insertion site covered dressed with a band aid before applying  a kerlex bandage pressure dressing..Minimal blood loss was noted during the procedure.  The patientt tolerated the procedure well.   She was instructed to wear the bandage for 24 hours, call with any signs of infection.  She was given the Implanon  card and instructed to have the rod removed in 3 years.  Jinnie Cookey, CNM   [facility]  02/01/2024 4:56 PM

## 2024-02-01 NOTE — Discharge Instructions (Signed)

## 2024-02-01 NOTE — Progress Notes (Signed)
 Post Partum Day 1 Subjective: up ad lib, voiding, tolerating PO, and + flatus Has numbness in her left leg and thigh, she is able to move her leg and bear weight, but she cannot discern touch when being touched, she has an awareness that she is being touched but cannot say whether is it sharp or dull. Has been seen by Anesthesia. Has low back pain at the epidural site, using PRN medication and heat, stitches are uncomfortable but tolerable. Bottle feeding. Mood is good. Blakley would like to go home today.   Objective: Blood pressure 103/64, pulse 87, temperature 97.8 F (36.6 C), temperature source Oral, resp. rate 18, height 5' 4 (1.626 m), weight 83 kg, last menstrual period 05/07/2023, SpO2 99%, unknown if currently breastfeeding.  Physical Exam:  General: alert and cooperative Lochia: appropriate Uterine Fundus: firm Incision: NA Perineum: Well approximated, slightly swollen  DVT Evaluation: No evidence of DVT seen on physical exam. Negative Homan's sign. Trace BLE.   Recent Labs    01/30/24 2222  HGB 11.0*  HCT 32.1*    Assessment/Plan: Contraception Nexplanon  on discharge Plan for discharge later today   LOS: 2 days   JINNIE HERO [facility], CNM 02/01/2024, 10:41 AM

## 2024-02-01 NOTE — Progress Notes (Signed)
 PT Screen Note  Patient Details Name: Theresa Roberts MRN: [id number] DOB: 1981-01-21   Cancelled Treatment:    Reason Eval/Treat Not Completed: PT screened, no needs identified, will sign off Pt reports ongoing L medial LE numbness >36 hours epidural block post labor and delivery.  She displays WNL strength and ROM in L LE and was able to easily and confidently ambulation w/o AD at community appropriate speeds w/o hesitation, buckling or other issues.  Pt was able to maintain SLS on R & L >5 seconds, did 5xSTS w/o UEs in ~10 seconds and was able to maintain static standing w/ eyes closed statically and with moderate perturbations w/o LOBs.  Pt will have partner and 6 y/o son home to assist at discharge.  Pt safe to return home with no further PT needs, she does know to contact neuro if numbness symptoms persist.  Carmin JONELLE Deed, DPT 02/01/2024, 3:57 PM

## 2024-02-03 ENCOUNTER — Other Ambulatory Visit

## 2024-02-03 ENCOUNTER — Encounter: Admitting: Licensed Practical Nurse

## 2024-02-06 ENCOUNTER — Telehealth: Payer: Self-pay

## 2024-02-06 NOTE — Telephone Encounter (Signed)
 Theresa Roberts called triage stating she delivered 6 days ago, but now the swelling in her legs, ankles, shoes don't fit, she also states her hand wrist, and fingers are all swollen as well. She's wondering what she can do or if she needs to be seen

## 2024-02-16 ENCOUNTER — Telehealth (INDEPENDENT_AMBULATORY_CARE_PROVIDER_SITE_OTHER): Admitting: Obstetrics and Gynecology

## 2024-02-16 ENCOUNTER — Encounter: Payer: Self-pay | Admitting: Obstetrics and Gynecology

## 2024-02-16 NOTE — Progress Notes (Signed)
 Virtual Visit via Video Note  I connected with Theresa Roberts on 02/16/24 at  7:35 AM EDT by video and verified that I was speaking with the correct person using two identifiers.    Ms. Theresa Roberts is a 43 y.o. [phone] who LMP was No LMP recorded. I discussed the limitations, risks, security and privacy concerns of performing an evaluation and management service by video and the availability of in person appointments. I also discussed with the patient that there may be a patient responsible charge related to this service. The patient expressed understanding and agreed to proceed.  Location of patient:  Home  Patient gave explicit verbal consent for video visit:  YES  Location of provider:  [facility] office  Persons other than physician and patient involved in provider conference:  None   Subjective:   History of Present Illness:    Patient recently had a term 9+ pound vaginal birth.  She presents today to check-in at 2 weeks.  She reports she is doing well her swelling has mostly resolved at this point.  She does still have some numbness in her leg near her knee but she attributes this to holding her legs back and pushing.  She states that it gets better almost every day.  She is not concerned about it.  She and the baby are bonding and getting along well.  She says the baby is now well over 10 pounds.  Reports that she is getting limited rest but what is expected as a newborn.  Hx: The following portions of the patient's history were reviewed and updated as appropriate:             She  has a past medical history of Scoliosis. She does not have any pertinent problems on file. She  has a past surgical history that includes Placement of breast implants and Cholecystectomy. Her family history includes Asthma in her son; Breast cancer in her maternal grandmother and maternal great-grandmother; Diabetes in her maternal grandfather; Heart attack in her maternal grandfather. She  reports  that she has never smoked. She has never used smokeless tobacco. She reports that she does not drink alcohol and does not use drugs. She has a current medication list which includes the following prescription(s): acetaminophen , ibuprofen , prenatal 36, and witch hazel-glycerin . She is allergic to wellbutrin  [bupropion ].       Review of Systems:  Review of Systems  Constitutional: Denied constitutional symptoms, night sweats, recent illness, fatigue, fever, insomnia and weight loss.  Eyes: Denied eye symptoms, eye pain, photophobia, vision change and visual disturbance.  Ears/Nose/Throat/Neck: Denied ear, nose, throat or neck symptoms, hearing loss, nasal discharge, sinus congestion and sore throat.  Cardiovascular: Denied cardiovascular symptoms, arrhythmia, chest pain/pressure, edema, exercise intolerance, orthopnea and palpitations.  Respiratory: Denied pulmonary symptoms, asthma, pleuritic pain, productive sputum, cough, dyspnea and wheezing.  Gastrointestinal: Denied, gastro-esophageal reflux, melena, nausea and vomiting.  Genitourinary: Denied genitourinary symptoms including symptomatic vaginal discharge, pelvic relaxation issues, and urinary complaints.  Musculoskeletal: Denied musculoskeletal symptoms, stiffness, swelling, muscle weakness and myalgia.  Dermatologic: Denied dermatology symptoms, rash and scar.  Neurologic: Denied neurology symptoms, dizziness, headache, neck pain and syncope.  Psychiatric: Denied psychiatric symptoms, anxiety and depression.  Endocrine: Denied endocrine symptoms including hot flashes and night sweats.   Meds:   Current Outpatient Medications on File Prior to Visit  Medication Sig Dispense Refill   acetaminophen  (TYLENOL ) 650 MG CR tablet Take 650 mg by mouth every 8 (eight) hours as needed  for pain.     ibuprofen  (ADVIL ) 600 MG tablet Take 1 tablet (600 mg total) by mouth every 6 (six) hours as needed.     Prenatal Vit-DSS-Fe Fum-FA (PRENATAL 19) 29-1  MG TABS Take 1 each by mouth daily with breakfast. 30 tablet 12   witch hazel-glycerin  (TUCKS) pad Apply topically as needed for itching.     No current facility-administered medications on file prior to visit.    Assessment:    [id number] Patient Active Problem List   Diagnosis Date Noted   Labor and delivery, indication for care 01/30/2024   [redacted] weeks gestation of pregnancy 01/20/2024   Anemia in pregnancy 01/06/2024   AMA (advanced maternal age) multigravida 35+ 07/28/2023   Supervision of other normal pregnancy, antepartum 06/28/2023   GAD (generalized anxiety disorder) 11/30/2021   Chronic post-traumatic stress disorder (PTSD) 11/30/2021   Scoliosis 01/23/2016   Anterolisthesis 09/02/2015     1. Postpartum care following vaginal delivery     Patient doing well with no issues.  Plan:            1.  Follow-up for 6-week postpartum appointment in 4 weeks. Orders No orders of the defined types were placed in this encounter.   No orders of the defined types were placed in this encounter.     F/U  No follow-ups on file.   Alm DOROTHA Sar, M.D. 02/16/2024 7:34 AM

## 2024-03-19 ENCOUNTER — Encounter: Payer: Self-pay | Admitting: Obstetrics

## 2024-03-19 ENCOUNTER — Ambulatory Visit: Admitting: Obstetrics

## 2024-03-19 DIAGNOSIS — Z1231 Encounter for screening mammogram for malignant neoplasm of breast: Secondary | ICD-10-CM

## 2024-03-19 NOTE — Progress Notes (Signed)
    Post Partum Visit Note  Theresa Roberts is a 43 y.o. [id number] female who presents for a postpartum visit. She is 6 weeks postpartum following a normal spontaneous vaginal delivery.  I have fully reviewed the prenatal and intrapartum course and was present at the birth. The delivery was at 38.3 gestational weeks.  Anesthesia: epidural. Postpartum course has been uncomplicated. She is still experiencing decreased sensation in her left leg. It is improving but not all the way back to normal. Baby is doing well. Baby is feeding by bottle. Bleeding: still having daily bleeding. Bowel function is normal. Bladder function is normal. Patient is not sexually active. Contraception method is Nexplanon . Postpartum depression screening: negative.    Health Maintenance Due  Topic Date Due   Hepatitis B Vaccines (1 of 3 - 19+ 3-dose series) Never done   HPV VACCINES (1 - 3-dose SCDM series) Never done   INFLUENZA VACCINE  03/09/2024    The following portions of the patient's history were reviewed and updated as appropriate: allergies, current medications, and problem list.  Review of Systems Pertinent items are noted in HPI.  Objective:  BP (!) 91/53   Pulse 67   Ht 5' 4 (1.626 m)   Wt 163 lb (73.9 kg)   Breastfeeding No   BMI 27.98 kg/m    General:  alert and cooperative   Breasts:  not indicated  Lungs: clear to auscultation bilaterally  Heart:  regular rate and rhythm, S1, S2 normal, no murmur, click, rub or gallop  Abdomen: soft, non-tender; bowel sounds normal; no masses,  no organomegaly   Wound: Laceration well-healed  GU exam:  normal       Assessment:    1. Postpartum care following vaginal delivery    Normal postpartum exam.  Nexplanon  in place  Plan:   Essential components of care per ACOG recommendations:  1.  Mood and well being: Patient with negative depression screening today.   2. Infant care and feeding:  -Patient currently breastmilk feeding? No.   -Social determinants of health (SDOH) reviewed in EPIC.   3. Sexuality, contraception and birth spacing - Patient does not want a pregnancy in the next year.  Desired family size is 3 children.  - Nexplanon  in place 4. Sleep and fatigue -Encouraged family/partner/community support of 4 hrs of uninterrupted sleep to help with mood and fatigue  5. Physical Recovery  - Discussed patients delivery and complications. She describes her labor as good. - Patient had a vaginal birth. Patient had a 2nd degree laceration. Perineal healing reviewed. Patient expressed understanding - Patient has urinary incontinence? No. - Patient is safe to resume physical and sexual activity  6.  Health Maintenance  - Last pap smear: 12/24. NILM, +HPV Repeat Pap 07/2024 -Breast Cancer screening indicated? Yes. Patient referred today for mammogram.   7. Chronic Disease/Pregnancy Condition follow up: None  - PCP follow up already scheduled. Will seek referral if sensation in leg has not improved by her PCP visit.  Eleanor CHRISTELLA Canny, CNM [facility] [place], [facility]

## 2024-04-13 ENCOUNTER — Telehealth: Payer: Self-pay | Admitting: Family Medicine

## 2024-04-13 MED ORDER — ACYCLOVIR 800 MG PO TABS: ORAL_TABLET | ORAL | 12 refills | Status: AC

## 2024-04-13 NOTE — Telephone Encounter (Signed)
 Requesting refill of acyclovir  that ended 12/23/23

## 2024-04-13 NOTE — Telephone Encounter (Signed)
 Are we able to send in a prescription for the patient? Or does she need an appointment?

## 2024-04-13 NOTE — Telephone Encounter (Signed)
 Copied from CRM #[id number]. Topic: Clinical - Medication Refill >> Apr 13, 2024 12:43 PM Nathanel BROCKS wrote: Medication: acyclovir  (ZOVIRAX ) 800 MG tablet  Has the patient contacted their pharmacy? No   This is the patient's preferred pharmacy:  [id number] [place], [place] - [address] AT [facility] OF [address] [place] [address] Phone: [phone] Fax: [phone]  Is this the correct pharmacy for this prescription? Yes If no, delete pharmacy and type the correct one.   Has the prescription been filled recently? No  Is the patient out of the medication? Yes  Has the patient been seen for an appointment in the last year OR does the patient have an upcoming appointment? Yes  Can we respond through MyChart? Yes  Agent: Please be advised that Rx refills may take up to 3 business days. We ask that you follow-up with your pharmacy.

## 2024-04-24 ENCOUNTER — Encounter: Payer: Self-pay | Admitting: Family Medicine

## 2024-04-24 ENCOUNTER — Ambulatory Visit (INDEPENDENT_AMBULATORY_CARE_PROVIDER_SITE_OTHER): Admitting: Family Medicine

## 2024-04-24 VITALS — BP 109/71 | HR 65 | Temp 98.8°F | Ht 64.0 in | Wt 165.7 lb

## 2024-04-24 DIAGNOSIS — M4310 Spondylolisthesis, site unspecified: Secondary | ICD-10-CM

## 2024-04-24 MED ORDER — OXYCODONE-ACETAMINOPHEN 5-325 MG PO TABS: 1.0000 | ORAL_TABLET | ORAL | 0 refills | Status: DC | PRN

## 2024-04-24 NOTE — Progress Notes (Signed)
 BP 109/71 (BP Location: Left Arm, Patient Position: Sitting, Cuff Size: Normal)   Pulse 65   Temp 98.8 F (37.1 C) (Oral)   Ht 5' 4 (1.626 m)   Wt 165 lb 11.2 oz (75.2 kg)   SpO2 99%   BMI 28.44 kg/m    Subjective:    Patient ID: Theresa Roberts, female    DOB: 12-06-1980, 43 y.o.   MRN: [id number]  HPI: Theresa Roberts is a 43 y.o. female  Chief Complaint  Patient presents with   Ear Pain    Right, has been a problem off and on for a couple of days    Pain        CHRONIC PAIN  Present dose:  nothing, recently pregnant Pain control status: stable Duration: chronic Location: low back Quality: aching and sore Current Pain Level: mild Previous Pain Level: moderate Breakthrough pain: yes Benefit from narcotic medications: yes- hasn't been on it for about a year due to pregnancy What Activities task can be accomplished with current medication? Able to do her ADLs, work and care for her children Interested in weaning off narcotics:no   Stool softners/OTC fiber: no  Previous pain specialty evaluation: yes Non-narcotic analgesic meds: no Narcotic contract: yes   Relevant past medical, surgical, family and social history reviewed and updated as indicated. Interim medical history since our last visit reviewed. Allergies and medications reviewed and updated.  Review of Systems  Constitutional: Negative.   Respiratory: Negative.    Cardiovascular: Negative.   Musculoskeletal:  Positive for back pain and myalgias. Negative for arthralgias, gait problem, joint swelling, neck pain and neck stiffness.  Skin: Negative.   Psychiatric/Behavioral: Negative.      Per HPI unless specifically indicated above     Objective:    BP 109/71 (BP Location: Left Arm, Patient Position: Sitting, Cuff Size: Normal)   Pulse 65   Temp 98.8 F (37.1 C) (Oral)   Ht 5' 4 (1.626 m)   Wt 165 lb 11.2 oz (75.2 kg)   SpO2 99%   BMI 28.44 kg/m   Wt Readings from Last 3 Encounters:   04/24/24 165 lb 11.2 oz (75.2 kg)  03/19/24 163 lb (73.9 kg)  01/30/24 182 lb 15.7 oz (83 kg)    Physical Exam Vitals and nursing note reviewed.  Constitutional:      General: She is not in acute distress.    Appearance: Normal appearance. She is not ill-appearing, toxic-appearing or diaphoretic.  HENT:     Head: Normocephalic and atraumatic.     Right Ear: External ear normal.     Left Ear: External ear normal.     Nose: Nose normal.     Mouth/Throat:     Mouth: Mucous membranes are moist.     Pharynx: Oropharynx is clear.  Eyes:     General: No scleral icterus.       Right eye: No discharge.        Left eye: No discharge.     Extraocular Movements: Extraocular movements intact.     Conjunctiva/sclera: Conjunctivae normal.     Pupils: Pupils are equal, round, and reactive to light.  Cardiovascular:     Rate and Rhythm: Normal rate and regular rhythm.     Pulses: Normal pulses.     Heart sounds: Normal heart sounds. No murmur heard.    No friction rub. No gallop.  Pulmonary:     Effort: Pulmonary effort is normal. No respiratory distress.  Breath sounds: Normal breath sounds. No stridor. No wheezing, rhonchi or rales.  Chest:     Chest wall: No tenderness.  Musculoskeletal:        General: Normal range of motion.     Cervical back: Normal range of motion and neck supple.  Skin:    General: Skin is warm and dry.     Capillary Refill: Capillary refill takes less than 2 seconds.     Coloration: Skin is not jaundiced or pale.     Findings: No bruising, erythema, lesion or rash.  Neurological:     General: No focal deficit present.     Mental Status: She is alert and oriented to person, place, and time. Mental status is at baseline.  Psychiatric:        Mood and Affect: Mood normal.        Behavior: Behavior normal.        Thought Content: Thought content normal.        Judgment: Judgment normal.     Results for orders placed or performed during the hospital  encounter of 01/30/24  CBC   Collection Time: 01/30/24 10:22 PM  Result Value Ref Range   WBC 11.2 (H) 4.0 - 10.5 K/uL   RBC 3.87 3.87 - 5.11 MIL/uL   Hemoglobin 11.0 (L) 12.0 - 15.0 g/dL   HCT 67.8 (L) 63.9 - 53.9 %   MCV 82.9 80.0 - 100.0 fL   MCH 28.4 26.0 - 34.0 pg   MCHC 34.3 30.0 - 36.0 g/dL   RDW 86.2 88.4 - 84.4 %   Platelets 361 150 - 400 K/uL   nRBC 0.0 0.0 - 0.2 %  RPR   Collection Time: 01/30/24 10:22 PM  Result Value Ref Range   RPR Ser Ql NON REACTIVE NON REACTIVE  Type and screen   Collection Time: 01/30/24 10:22 PM  Result Value Ref Range   ABO/RH(D) AB POS    Antibody Screen NEG    Sample Expiration      02/02/2024,2359 Performed at [facility], [address], [place], [place] [address]   ABO/Rh   Collection Time: 01/31/24 12:55 AM  Result Value Ref Range   ABO/RH(D)      AB POS Performed at [facility], [address], [place], [place] [address]       Assessment & Plan:   Problem List Items Addressed This Visit       Musculoskeletal and Integument   Anterolisthesis - Primary   Chronic. Has been off medicine for the last week due to pregnancy. We can restart meds, but will check MRI to see if there are other treatments possible. Continue to monitor. Call with any concerns.       Relevant Orders   MR Lumbar Spine Wo Contrast     Follow up plan: Return in about 3 months (around 07/24/2024) for physical.

## 2024-04-24 NOTE — Assessment & Plan Note (Signed)
 Chronic. Has been off medicine for the last week due to pregnancy. We can restart meds, but will check MRI to see if there are other treatments possible. Continue to monitor. Call with any concerns.

## 2024-05-15 ENCOUNTER — Encounter: Payer: Self-pay | Admitting: Family Medicine

## 2024-07-19 ENCOUNTER — Encounter: Payer: Self-pay | Admitting: Family Medicine

## 2024-07-19 ENCOUNTER — Ambulatory Visit (INDEPENDENT_AMBULATORY_CARE_PROVIDER_SITE_OTHER): Admitting: Family Medicine

## 2024-07-19 VITALS — BP 111/72 | HR 82 | Temp 98.0°F | Ht 64.0 in | Wt 166.6 lb

## 2024-07-19 DIAGNOSIS — Z0000 Encounter for general adult medical examination without abnormal findings: Secondary | ICD-10-CM

## 2024-07-19 DIAGNOSIS — G8929 Other chronic pain: Secondary | ICD-10-CM | POA: Diagnosis not present

## 2024-07-19 DIAGNOSIS — M545 Low back pain, unspecified: Secondary | ICD-10-CM

## 2024-07-19 DIAGNOSIS — H66001 Acute suppurative otitis media without spontaneous rupture of ear drum, right ear: Secondary | ICD-10-CM | POA: Diagnosis not present

## 2024-07-19 MED ORDER — FLUCONAZOLE 150 MG PO TABS: 150.0000 mg | ORAL_TABLET | Freq: Every day | ORAL | 0 refills | Status: AC

## 2024-07-19 MED ORDER — AMOXICILLIN-POT CLAVULANATE 875-125 MG PO TABS: 1.0000 | ORAL_TABLET | Freq: Two times a day (BID) | ORAL | 0 refills | Status: AC

## 2024-07-19 MED ORDER — OXYCODONE-ACETAMINOPHEN 5-325 MG PO TABS: 1.0000 | ORAL_TABLET | ORAL | 0 refills | Status: AC | PRN

## 2024-07-19 NOTE — Progress Notes (Signed)
 BP 111/72   Pulse 82   Temp 98 F (36.7 C) (Oral)   Ht 5' 4 (1.626 m)   Wt 166 lb 9.6 oz (75.6 kg)   SpO2 98%   BMI 28.60 kg/m    Subjective:    Patient ID: Theresa Roberts, female    DOB: November 19, 1980, 43 y.o.   MRN: [id number]  HPI: Theresa Roberts is a 43 y.o. female presenting on 07/19/2024 for comprehensive medical examination. Current medical complaints include:  CHRONIC PAIN  Present dose: 45 MME daily Pain control status: worse Duration: chronic Location: low back Quality: aching and sore Current Pain Level: mild Previous Pain Level: moderate Breakthrough pain: yes Benefit from narcotic medications: yes- hasn't been on it for about a year due to pregnancy What Activities task can be accomplished with current medication? Able to do her ADLs, work and care for her children Interested in weaning off narcotics:no   Stool softners/OTC fiber: no  Previous pain specialty evaluation: yes Non-narcotic analgesic meds: no Narcotic contract: yes  Menopausal Symptoms: no  Depression Screen done today and results listed below:     07/19/2024    4:19 PM 04/24/2024   10:51 AM 01/30/2024    2:06 PM 10/17/2023   11:50 AM 07/28/2023    2:10 PM  Depression screen PHQ 2/9  Decreased Interest 0 0 1 0 0  Down, Depressed, Hopeless 0 0 0 0 0  PHQ - 2 Score 0 0 1 0 0  Altered sleeping 0 0 0 0 0  Tired, decreased energy 1 1 1 1 1   Change in appetite 1 0 0 0 0  Feeling bad or failure about yourself  0 0 0 0 0  Trouble concentrating 1 0 1 1 1   Moving slowly or fidgety/restless 1 1 1 1 1   Suicidal thoughts 0 0 0 0 0  PHQ-9 Score 4 2  4  3  3    Difficult doing work/chores Somewhat difficult  Somewhat difficult  Not difficult at all     Data saved with a previous flowsheet row definition     Past Medical History:  Past Medical History:  Diagnosis Date   AMA (advanced maternal age) multigravida 35+ 07/28/2023   Plan for AMA >40 yo by EDD:  - Growth US  with AFI @ 34 weeks  -  Weekly NSTs starting at 36 weeks  - Daily FKC  - Consider delivery by EDD        Anemia in pregnancy 01/06/2024   28 wks: Hgb 10.5     Scoliosis    Supervision of other normal pregnancy, antepartum 06/28/2023                Clinical Staff    Provider      Office Location     [facility]    Dating     8 wk US        Language     English    Anatomy US             Flu Vaccine     Declined    Genetic Screen     NIPS: neg, female      TDaP vaccine     Declined    Hgb A1C or   GTT    Early :5.3  Third trimester :       Covid              LAB RESULTS  Rhogam     AB/Positive/-- (12/19 1403)     Blood Type    Surgical History:  Past Surgical History:  Procedure Laterality Date   CHOLECYSTECTOMY     PLACEMENT OF BREAST IMPLANTS      Medications:  Current Outpatient Medications on File Prior to Visit  Medication Sig   acetaminophen  (TYLENOL ) 650 MG CR tablet Take 650 mg by mouth every 8 (eight) hours as needed for pain.   acyclovir  (ZOVIRAX ) 800 MG tablet TAKE 1 TABLET(800 MG) BY MOUTH TWICE DAILY   ibuprofen  (ADVIL ) 600 MG tablet Take 1 tablet (600 mg total) by mouth every 6 (six) hours as needed.   No current facility-administered medications on file prior to visit.    Allergies:  Allergies[1]  Social History:  Social History   Socioeconomic History   Marital status: Significant Other    Spouse name: Not on file   Number of children: 2   Years of education: Not on file   Highest education level: Not on file  Occupational History   Not on file  Tobacco Use   Smoking status: Never   Smokeless tobacco: Never  Vaping Use   Vaping status: Never Used  Substance and Sexual Activity   Alcohol use: No    Alcohol/week: 0.0 standard drinks of alcohol   Drug use: No   Sexual activity: Yes    Partners: Male    Birth control/protection: None  Other Topics Concern   Not on file  Social History Narrative   Not on file   Social Drivers of Health   Tobacco Use: Low Risk  (07/19/2024)   Patient History    Smoking Tobacco Use: Never    Smokeless Tobacco Use: Never    Passive Exposure: Not on file  Financial Resource Strain: Low Risk (06/28/2023)   Overall Financial Resource Strain (CARDIA)    Difficulty of Paying Living Expenses: Not very hard  Food Insecurity: No Food Insecurity (01/30/2024)   Epic    Worried About Radiation Protection Practitioner of Food in the Last Year: Never true    Ran Out of Food in the Last Year: Never true  Transportation Needs: No Transportation Needs (01/30/2024)   Epic    Lack of Transportation (Medical): No    Lack of Transportation (Non-Medical): No  Physical Activity: Insufficiently Active (06/28/2023)   Exercise Vital Sign    Days of Exercise per Week: 2 days    Minutes of Exercise per Session: 20 min  Stress: No Stress Concern Present (06/28/2023)   [employer] of Occupational Health - Occupational Stress Questionnaire    Feeling of Stress : Not at all  Social Connections: Unknown (01/30/2024)   Social Connection and Isolation Panel    Frequency of Communication with Friends and Family: Three times a week    Frequency of Social Gatherings with Friends and Family: Not on file    Attends Religious Services: Not on file    Active Member of Clubs or Organizations: Not on file    Attends Banker Meetings: Not on file    Marital Status: Not on file  Intimate Partner Violence: Not At Risk (01/30/2024)   Epic    Fear of Current or Ex-Partner: No    Emotionally Abused: No    Physically Abused: No    Sexually Abused: No  Depression (PHQ2-9): Low Risk (07/19/2024)   Depression (PHQ2-9)    PHQ-2 Score: 4  Alcohol Screen: Not on file  Housing: Unknown (01/30/2024)   Epic  Unable to Pay for Housing in the Last Year: No    Number of Times Moved in the Last Year: Not on file    Homeless in the Last Year: No  Utilities: Not At Risk (01/30/2024)   Epic    Threatened with loss of utilities: No  Health Literacy: Adequate Health  Literacy (06/28/2023)   B1300 Health Literacy    Frequency of need for help with medical instructions: Never   Tobacco Use History[2] Social History   Substance and Sexual Activity  Alcohol Use No   Alcohol/week: 0.0 standard drinks of alcohol    Family History:  Family History  Problem Relation Age of Onset   Breast cancer Maternal Grandmother        malignant, unknown age   Heart attack Maternal Grandfather    Diabetes Maternal Grandfather        type 2   Asthma Son    Breast cancer Maternal Great-grandmother        early years    Past medical history, surgical history, medications, allergies, family history and social history reviewed with patient today and changes made to appropriate areas of the chart.   Review of Systems  Constitutional: Negative.   HENT:  Positive for ear pain (R ear). Negative for congestion, ear discharge, hearing loss, nosebleeds, sinus pain, sore throat and tinnitus.   Eyes: Negative.   Respiratory: Negative.  Negative for stridor.   Cardiovascular: Negative.   Gastrointestinal: Negative.   Genitourinary: Negative.   Musculoskeletal:  Positive for back pain. Negative for falls, joint pain, myalgias and neck pain.  Skin: Negative.   Neurological:  Positive for tingling (in her feet). Negative for dizziness, tremors, sensory change, speech change, focal weakness, seizures, loss of consciousness, weakness and headaches.  Endo/Heme/Allergies:  Positive for environmental allergies. Negative for polydipsia. Does not bruise/bleed easily.  Psychiatric/Behavioral: Negative.     All other ROS negative except what is listed above and in the HPI.      Objective:    BP 111/72   Pulse 82   Temp 98 F (36.7 C) (Oral)   Ht 5' 4 (1.626 m)   Wt 166 lb 9.6 oz (75.6 kg)   SpO2 98%   BMI 28.60 kg/m   Wt Readings from Last 3 Encounters:  07/19/24 166 lb 9.6 oz (75.6 kg)  04/24/24 165 lb 11.2 oz (75.2 kg)  03/19/24 163 lb (73.9 kg)    Physical  Exam Vitals and nursing note reviewed.  Constitutional:      General: She is not in acute distress.    Appearance: Normal appearance. She is normal weight. She is not ill-appearing, toxic-appearing or diaphoretic.  HENT:     Head: Normocephalic and atraumatic.     Right Ear: Tympanic membrane, ear canal and external ear normal. There is no impacted cerumen.     Left Ear: Tympanic membrane, ear canal and external ear normal. There is no impacted cerumen.     Nose: Nose normal. No congestion or rhinorrhea.     Mouth/Throat:     Mouth: Mucous membranes are moist.     Pharynx: Oropharynx is clear. No oropharyngeal exudate or posterior oropharyngeal erythema.  Eyes:     General: No scleral icterus.       Right eye: No discharge.        Left eye: No discharge.     Extraocular Movements: Extraocular movements intact.     Conjunctiva/sclera: Conjunctivae normal.     Pupils: Pupils are equal,  round, and reactive to light.  Neck:     Vascular: No carotid bruit.  Cardiovascular:     Rate and Rhythm: Normal rate and regular rhythm.     Pulses: Normal pulses.     Heart sounds: No murmur heard.    No friction rub. No gallop.  Pulmonary:     Effort: Pulmonary effort is normal. No respiratory distress.     Breath sounds: Normal breath sounds. No stridor. No wheezing, rhonchi or rales.  Chest:     Chest wall: No tenderness.  Abdominal:     General: Abdomen is flat. Bowel sounds are normal. There is no distension.     Palpations: Abdomen is soft. There is no mass.     Tenderness: There is no abdominal tenderness. There is no right CVA tenderness, left CVA tenderness, guarding or rebound.     Hernia: No hernia is present.  Genitourinary:    Comments: Breast and pelvic exams deferred with shared decision making Musculoskeletal:        General: No swelling, tenderness, deformity or signs of injury.     Cervical back: Normal range of motion and neck supple. No rigidity. No muscular tenderness.      Right lower leg: No edema.     Left lower leg: No edema.  Lymphadenopathy:     Cervical: No cervical adenopathy.  Skin:    General: Skin is warm and dry.     Capillary Refill: Capillary refill takes less than 2 seconds.     Coloration: Skin is not jaundiced or pale.     Findings: No bruising, erythema, lesion or rash.  Neurological:     General: No focal deficit present.     Mental Status: She is alert and oriented to person, place, and time. Mental status is at baseline.     Cranial Nerves: No cranial nerve deficit.     Sensory: No sensory deficit.     Motor: No weakness.     Coordination: Coordination normal.     Gait: Gait normal.     Deep Tendon Reflexes: Reflexes normal.  Psychiatric:        Mood and Affect: Mood normal.        Behavior: Behavior normal.        Thought Content: Thought content normal.        Judgment: Judgment normal.     Results for orders placed or performed during the hospital encounter of 01/30/24  CBC   Collection Time: 01/30/24 10:22 PM  Result Value Ref Range   WBC 11.2 (H) 4.0 - 10.5 K/uL   RBC 3.87 3.87 - 5.11 MIL/uL   Hemoglobin 11.0 (L) 12.0 - 15.0 g/dL   HCT 67.8 (L) 63.9 - 53.9 %   MCV 82.9 80.0 - 100.0 fL   MCH 28.4 26.0 - 34.0 pg   MCHC 34.3 30.0 - 36.0 g/dL   RDW 86.2 88.4 - 84.4 %   Platelets 361 150 - 400 K/uL   nRBC 0.0 0.0 - 0.2 %  RPR   Collection Time: 01/30/24 10:22 PM  Result Value Ref Range   RPR Ser Ql NON REACTIVE NON REACTIVE  Type and screen   Collection Time: 01/30/24 10:22 PM  Result Value Ref Range   ABO/RH(D) AB POS    Antibody Screen NEG    Sample Expiration      02/02/2024,2359 Performed at [facility], [address] [place], [place] [address]   ABO/Rh   Collection Time: 01/31/24 12:55  AM  Result Value Ref Range   ABO/RH(D)      AB POS Performed at [facility], [address] [place], [place] [address]       Assessment & Plan:   Problem List Items Addressed This Visit    None Visit Diagnoses       Routine general medical examination at a health care facility    -  Primary   Vaccines up to date. Screening labs checked today. Pap up to date. Mammogram ordered. Continue diet and exercise. Call with any concerns.   Relevant Orders   CBC with Differential/Platelet   Comprehensive metabolic panel with GFR   Lipid Panel w/o Chol/HDL Ratio   TSH   Hepatitis B surface antibody,quantitative     Chronic bilateral low back pain without sciatica       Will get her x-ray and PT. Continue PRN pain medicine- Rx should last 3+ months. Call with any concerns. Continue to monitor.   Relevant Medications   oxyCODONE -acetaminophen  (PERCOCET/ROXICET) 5-325 MG tablet   Other Relevant Orders   DG Lumbar Spine Complete   Ambulatory referral to Physical Therapy     Non-recurrent acute suppurative otitis media of right ear without spontaneous rupture of tympanic membrane       Will treat with augmentin. Call with any concerns. Continue to monitor.   Relevant Medications   amoxicillin -clavulanate (AUGMENTIN) 875-125 MG tablet   fluconazole  (DIFLUCAN ) 150 MG tablet        Follow up plan: Return in about 6 weeks (around 08/30/2024).   LABORATORY TESTING:  - Pap smear: up to date  IMMUNIZATIONS:   - Tdap: Tetanus vaccination status reviewed: last tetanus booster within 10 years. - Influenza: Refused - Prevnar: Not applicable - COVID: Refused - HPV: Refused  SCREENING: -Mammogram: Ordered today   PATIENT COUNSELING:   Advised to take 1 mg of folate supplement per day if capable of pregnancy.   Sexuality: Discussed sexually transmitted diseases, partner selection, use of condoms, avoidance of unintended pregnancy  and contraceptive alternatives.   Advised to avoid cigarette smoking.  I discussed with the patient that most people either abstain from alcohol or drink within safe limits (<=14/week and <=4 drinks/occasion for males, <=7/weeks and <= 3 drinks/occasion  for females) and that the risk for alcohol disorders and other health effects rises proportionally with the number of drinks per week and how often a drinker exceeds daily limits.  Discussed cessation/primary prevention of drug use and availability of treatment for abuse.   Diet: Encouraged to adjust caloric intake to maintain  or achieve ideal body weight, to reduce intake of dietary saturated fat and total fat, to limit sodium intake by avoiding high sodium foods and not adding table salt, and to maintain adequate dietary potassium and calcium  preferably from fresh fruits, vegetables, and low-fat dairy products.    stressed the importance of regular exercise  Injury prevention: Discussed safety belts, safety helmets, smoke detector, smoking near bedding or upholstery.   Dental health: Discussed importance of regular tooth brushing, flossing, and dental visits.    NEXT PREVENTATIVE PHYSICAL DUE IN 1 YEAR. Return in about 6 weeks (around 08/30/2024).              [1]  Allergies Allergen Reactions   Wellbutrin  [Bupropion ] Other (See Comments)    irritability  [2]  Social History Tobacco Use  Smoking Status Never  Smokeless Tobacco Never

## 2024-07-20 ENCOUNTER — Ambulatory Visit: Payer: Self-pay | Admitting: Family Medicine

## 2024-07-20 LAB — COMPREHENSIVE METABOLIC PANEL WITH GFR
ALT: 23 IU/L (ref 0–32)
AST: 21 IU/L (ref 0–40)
Albumin: 4.8 g/dL (ref 3.9–4.9)
Alkaline Phosphatase: 81 IU/L (ref 41–116)
BUN/Creatinine Ratio: 16 (ref 9–23)
BUN: 12 mg/dL (ref 6–24)
Bilirubin Total: 0.3 mg/dL (ref 0.0–1.2)
CO2: 25 mmol/L (ref 20–29)
Calcium: 9.4 mg/dL (ref 8.7–10.2)
Chloride: 101 mmol/L (ref 96–106)
Creatinine, Ser: 0.77 mg/dL (ref 0.57–1.00)
Globulin, Total: 2.5 g/dL (ref 1.5–4.5)
Glucose: 88 mg/dL (ref 70–99)
Potassium: 4.1 mmol/L (ref 3.5–5.2)
Sodium: 139 mmol/L (ref 134–144)
Total Protein: 7.3 g/dL (ref 6.0–8.5)
eGFR: 99 mL/min/1.73 (ref >59)

## 2024-07-20 LAB — CBC WITH DIFFERENTIAL/PLATELET
Basophils Absolute: 0.1 x10E3/uL (ref 0.0–0.2)
Basos: 1 %
EOS (ABSOLUTE): 0.1 x10E3/uL (ref 0.0–0.4)
Eos: 2 %
Hematocrit: 42 % (ref 34.0–46.6)
Hemoglobin: 13.8 g/dL (ref 11.1–15.9)
Immature Grans (Abs): 0 x10E3/uL (ref 0.0–0.1)
Immature Granulocytes: 0 %
Lymphocytes Absolute: 1.9 x10E3/uL (ref 0.7–3.1)
Lymphs: 36 %
MCH: 30.4 pg (ref 26.6–33.0)
MCHC: 32.9 g/dL (ref 31.5–35.7)
MCV: 93 fL (ref 79–97)
Monocytes Absolute: 0.5 x10E3/uL (ref 0.1–0.9)
Monocytes: 9 %
Neutrophils Absolute: 2.8 x10E3/uL (ref 1.4–7.0)
Neutrophils: 52 %
Platelets: 320 x10E3/uL (ref 150–450)
RBC: 4.54 x10E6/uL (ref 3.77–5.28)
RDW: 12.6 % (ref 11.7–15.4)
WBC: 5.4 x10E3/uL (ref 3.4–10.8)

## 2024-07-20 LAB — LIPID PANEL W/O CHOL/HDL RATIO
Cholesterol, Total: 190 mg/dL (ref 100–199)
HDL: 86 mg/dL (ref >39)
LDL Chol Calc (NIH): 92 mg/dL (ref 0–99)
Triglycerides: 65 mg/dL (ref 0–149)
VLDL Cholesterol Cal: 12 mg/dL (ref 5–40)

## 2024-07-20 LAB — HEPATITIS B SURFACE ANTIBODY, QUANTITATIVE: Hepatitis B Surf Ab Quant: 170 m[IU]/mL

## 2024-07-20 LAB — TSH: TSH: 0.695 u[IU]/mL (ref 0.450–4.500)

## 2024-08-31 ENCOUNTER — Ambulatory Visit: Admitting: Family Medicine

## 2024-09-17 ENCOUNTER — Ambulatory Visit: Admitting: Family Medicine
